# Patient Record
Sex: Female | Born: 1945 | Race: White | Hispanic: No | Smoking: Former smoker
Health system: Southern US, Community
[De-identification: ages and names within clinical notes are randomized; demographics above are authoritative.]

## PROBLEM LIST (undated history)

## (undated) DIAGNOSIS — N879 Dysplasia of cervix uteri, unspecified: Secondary | ICD-10-CM

## (undated) DIAGNOSIS — B029 Zoster without complications: Secondary | ICD-10-CM

## (undated) DIAGNOSIS — J189 Pneumonia, unspecified organism: Secondary | ICD-10-CM

## (undated) DIAGNOSIS — N952 Postmenopausal atrophic vaginitis: Secondary | ICD-10-CM

## (undated) DIAGNOSIS — I10 Essential (primary) hypertension: Secondary | ICD-10-CM

## (undated) DIAGNOSIS — J45909 Unspecified asthma, uncomplicated: Secondary | ICD-10-CM

## (undated) DIAGNOSIS — G709 Myoneural disorder, unspecified: Secondary | ICD-10-CM

## (undated) DIAGNOSIS — K219 Gastro-esophageal reflux disease without esophagitis: Secondary | ICD-10-CM

## (undated) DIAGNOSIS — M1990 Unspecified osteoarthritis, unspecified site: Secondary | ICD-10-CM

## (undated) DIAGNOSIS — M8580 Other specified disorders of bone density and structure, unspecified site: Secondary | ICD-10-CM

## (undated) DIAGNOSIS — N6009 Solitary cyst of unspecified breast: Secondary | ICD-10-CM

## (undated) DIAGNOSIS — C801 Malignant (primary) neoplasm, unspecified: Secondary | ICD-10-CM

## (undated) DIAGNOSIS — R6882 Decreased libido: Secondary | ICD-10-CM

## (undated) HISTORY — DX: Solitary cyst of unspecified breast: N60.09

## (undated) HISTORY — DX: Dysplasia of cervix uteri, unspecified: N87.9

## (undated) HISTORY — PX: HAND SURGERY: SHX662

## (undated) HISTORY — DX: Other specified disorders of bone density and structure, unspecified site: M85.80

## (undated) HISTORY — DX: Postmenopausal atrophic vaginitis: N95.2

## (undated) HISTORY — DX: Zoster without complications: B02.9

## (undated) HISTORY — PX: PELVIC LAPAROSCOPY: SHX162

## (undated) HISTORY — PX: BREAST CYST ASPIRATION: SHX578

## (undated) HISTORY — DX: Gastro-esophageal reflux disease without esophagitis: K21.9

## (undated) HISTORY — DX: Decreased libido: R68.82

## (undated) HISTORY — DX: Essential (primary) hypertension: I10

## (undated) HISTORY — DX: Malignant (primary) neoplasm, unspecified: C80.1

## (undated) HISTORY — DX: Unspecified asthma, uncomplicated: J45.909

---

## 1975-03-01 DIAGNOSIS — N879 Dysplasia of cervix uteri, unspecified: Secondary | ICD-10-CM

## 1975-03-01 HISTORY — PX: COLPOSCOPY: SHX161

## 1975-03-01 HISTORY — DX: Dysplasia of cervix uteri, unspecified: N87.9

## 1997-07-14 ENCOUNTER — Other Ambulatory Visit: Admission: RE | Admit: 1997-07-14 | Discharge: 1997-07-14 | Payer: Self-pay | Admitting: Obstetrics and Gynecology

## 1998-09-30 ENCOUNTER — Other Ambulatory Visit: Admission: RE | Admit: 1998-09-30 | Discharge: 1998-09-30 | Payer: Self-pay | Admitting: Obstetrics and Gynecology

## 1999-01-28 ENCOUNTER — Encounter (INDEPENDENT_AMBULATORY_CARE_PROVIDER_SITE_OTHER): Payer: Self-pay | Admitting: *Deleted

## 1999-01-28 ENCOUNTER — Ambulatory Visit (HOSPITAL_COMMUNITY): Admission: RE | Admit: 1999-01-28 | Discharge: 1999-01-28 | Payer: Self-pay | Admitting: Gastroenterology

## 1999-04-20 ENCOUNTER — Encounter: Admission: RE | Admit: 1999-04-20 | Discharge: 1999-04-20 | Payer: Self-pay | Admitting: Obstetrics and Gynecology

## 1999-04-20 ENCOUNTER — Encounter: Payer: Self-pay | Admitting: Obstetrics and Gynecology

## 1999-08-21 ENCOUNTER — Ambulatory Visit (HOSPITAL_COMMUNITY): Admission: EM | Admit: 1999-08-21 | Discharge: 1999-08-21 | Payer: Self-pay

## 1999-08-21 ENCOUNTER — Encounter: Payer: Self-pay | Admitting: Orthopedic Surgery

## 1999-10-13 ENCOUNTER — Other Ambulatory Visit: Admission: RE | Admit: 1999-10-13 | Discharge: 1999-10-13 | Payer: Self-pay | Admitting: Obstetrics and Gynecology

## 2000-05-04 ENCOUNTER — Encounter: Payer: Self-pay | Admitting: Obstetrics and Gynecology

## 2000-05-04 ENCOUNTER — Encounter: Admission: RE | Admit: 2000-05-04 | Discharge: 2000-05-04 | Payer: Self-pay | Admitting: Obstetrics and Gynecology

## 2000-10-13 ENCOUNTER — Other Ambulatory Visit: Admission: RE | Admit: 2000-10-13 | Discharge: 2000-10-13 | Payer: Self-pay | Admitting: Obstetrics and Gynecology

## 2000-11-01 ENCOUNTER — Encounter: Payer: Self-pay | Admitting: Obstetrics and Gynecology

## 2000-11-01 ENCOUNTER — Encounter: Admission: RE | Admit: 2000-11-01 | Discharge: 2000-11-01 | Payer: Self-pay | Admitting: Obstetrics and Gynecology

## 2001-05-08 ENCOUNTER — Encounter: Admission: RE | Admit: 2001-05-08 | Discharge: 2001-05-08 | Payer: Self-pay | Admitting: Obstetrics and Gynecology

## 2001-05-08 ENCOUNTER — Encounter: Payer: Self-pay | Admitting: Obstetrics and Gynecology

## 2001-10-17 ENCOUNTER — Other Ambulatory Visit: Admission: RE | Admit: 2001-10-17 | Discharge: 2001-10-17 | Payer: Self-pay | Admitting: Obstetrics and Gynecology

## 2002-02-04 ENCOUNTER — Ambulatory Visit (HOSPITAL_COMMUNITY): Admission: RE | Admit: 2002-02-04 | Discharge: 2002-02-04 | Payer: Self-pay | Admitting: Gastroenterology

## 2002-05-16 ENCOUNTER — Encounter: Payer: Self-pay | Admitting: Obstetrics and Gynecology

## 2002-05-16 ENCOUNTER — Encounter: Admission: RE | Admit: 2002-05-16 | Discharge: 2002-05-16 | Payer: Self-pay | Admitting: Obstetrics and Gynecology

## 2003-05-26 ENCOUNTER — Other Ambulatory Visit: Admission: RE | Admit: 2003-05-26 | Discharge: 2003-05-26 | Payer: Self-pay | Admitting: Obstetrics and Gynecology

## 2003-08-21 ENCOUNTER — Ambulatory Visit (HOSPITAL_COMMUNITY): Admission: RE | Admit: 2003-08-21 | Discharge: 2003-08-21 | Payer: Self-pay | Admitting: Obstetrics and Gynecology

## 2004-05-27 ENCOUNTER — Other Ambulatory Visit: Admission: RE | Admit: 2004-05-27 | Discharge: 2004-05-27 | Payer: Self-pay | Admitting: Obstetrics and Gynecology

## 2004-10-06 ENCOUNTER — Ambulatory Visit (HOSPITAL_COMMUNITY): Admission: RE | Admit: 2004-10-06 | Discharge: 2004-10-06 | Payer: Self-pay | Admitting: Obstetrics and Gynecology

## 2005-06-17 ENCOUNTER — Other Ambulatory Visit: Admission: RE | Admit: 2005-06-17 | Discharge: 2005-06-17 | Payer: Self-pay | Admitting: Obstetrics and Gynecology

## 2005-11-04 ENCOUNTER — Ambulatory Visit (HOSPITAL_COMMUNITY): Admission: RE | Admit: 2005-11-04 | Discharge: 2005-11-04 | Payer: Self-pay | Admitting: Obstetrics and Gynecology

## 2006-06-19 ENCOUNTER — Other Ambulatory Visit: Admission: RE | Admit: 2006-06-19 | Discharge: 2006-06-19 | Payer: Self-pay | Admitting: Obstetrics and Gynecology

## 2006-12-07 ENCOUNTER — Ambulatory Visit (HOSPITAL_COMMUNITY): Admission: RE | Admit: 2006-12-07 | Discharge: 2006-12-07 | Payer: Self-pay | Admitting: Obstetrics and Gynecology

## 2006-12-08 ENCOUNTER — Ambulatory Visit (HOSPITAL_COMMUNITY): Admission: RE | Admit: 2006-12-08 | Discharge: 2006-12-08 | Payer: Self-pay | Admitting: Obstetrics and Gynecology

## 2007-07-02 ENCOUNTER — Other Ambulatory Visit: Admission: RE | Admit: 2007-07-02 | Discharge: 2007-07-02 | Payer: Self-pay | Admitting: Obstetrics and Gynecology

## 2008-01-02 ENCOUNTER — Ambulatory Visit (HOSPITAL_COMMUNITY): Admission: RE | Admit: 2008-01-02 | Discharge: 2008-01-02 | Payer: Self-pay | Admitting: Pulmonary Disease

## 2008-07-02 ENCOUNTER — Other Ambulatory Visit: Admission: RE | Admit: 2008-07-02 | Discharge: 2008-07-02 | Payer: Self-pay | Admitting: Obstetrics and Gynecology

## 2008-07-02 ENCOUNTER — Ambulatory Visit: Payer: Self-pay | Admitting: Obstetrics and Gynecology

## 2008-07-02 ENCOUNTER — Encounter: Payer: Self-pay | Admitting: Obstetrics and Gynecology

## 2009-01-05 ENCOUNTER — Ambulatory Visit (HOSPITAL_COMMUNITY): Admission: RE | Admit: 2009-01-05 | Discharge: 2009-01-05 | Payer: Self-pay | Admitting: Obstetrics and Gynecology

## 2009-01-08 ENCOUNTER — Ambulatory Visit (HOSPITAL_COMMUNITY): Admission: RE | Admit: 2009-01-08 | Discharge: 2009-01-08 | Payer: Self-pay | Admitting: Obstetrics and Gynecology

## 2009-01-15 ENCOUNTER — Encounter: Admission: RE | Admit: 2009-01-15 | Discharge: 2009-01-15 | Payer: Self-pay | Admitting: Obstetrics and Gynecology

## 2009-07-03 ENCOUNTER — Ambulatory Visit: Payer: Self-pay | Admitting: Obstetrics and Gynecology

## 2009-07-03 ENCOUNTER — Other Ambulatory Visit: Admission: RE | Admit: 2009-07-03 | Discharge: 2009-07-03 | Payer: Self-pay | Admitting: Obstetrics and Gynecology

## 2010-01-26 ENCOUNTER — Encounter: Admission: RE | Admit: 2010-01-26 | Discharge: 2010-01-26 | Payer: Self-pay | Admitting: Obstetrics and Gynecology

## 2010-03-21 ENCOUNTER — Encounter: Payer: Self-pay | Admitting: Obstetrics and Gynecology

## 2010-07-07 ENCOUNTER — Encounter (INDEPENDENT_AMBULATORY_CARE_PROVIDER_SITE_OTHER): Payer: Medicare Other | Admitting: Obstetrics and Gynecology

## 2010-07-07 DIAGNOSIS — N951 Menopausal and female climacteric states: Secondary | ICD-10-CM

## 2010-07-07 DIAGNOSIS — M899 Disorder of bone, unspecified: Secondary | ICD-10-CM

## 2010-07-07 DIAGNOSIS — N952 Postmenopausal atrophic vaginitis: Secondary | ICD-10-CM

## 2010-07-16 NOTE — Op Note (Signed)
[facility]  Patient:    Erika Ramsey, Erika Ramsey                      MRN: [id number] Proc. Date: 08/21/99 Adm. Date:  [id number] Disc. Date: [id number] Attending:  Armanda Heritage CC:         Elisha Ponder, M.D.             Dr. Robert Bellow, Urgent Care                           Operative Report  PREOPERATIVE DIAGNOSES:  Right hand glass injury with deep imbedded, painful glass foreign body in the volar MCP region, right index finger.  Partial vs. complete radial nerve vascular bundle to the index finger laceration.  POSTOPERATIVE DIAGNOSES: 1. Imbedded, deep foreign object (glass) in the right index finger MCP region. 2. Partial radial digital nerve laceration to the right index finger. 3. Intact radial digital artery.  OPERATION: 1. Incision and drainage skin and subcutaneous tissue and deep tendinous    tissue, right palmar MCP region. 2. Removal of 1 cm glass foreign body about the right index finger palm region    at the MCP joint. 3. Inspection of the radial distal artery which did not reveal any laceration. 4. Repair radial digital nerve with epineurial technique.  This was a partial    laceration. 5. Microscope use for the radial digital nerve repair to the right index    finger.  SURGEON:  Elisha Ponder, M.D.  ASSISTANT:  None.  COMPLICATIONS:  None.  ANESTHESIA:  Bier block with IV sedation.  TOURNIQUET TIME:  Less than 45 minutes.  INDICATIONS:  This patient is a very pleasant 65 year old white female who presents with the above-mentioned diagnosis.  She was seen and stabilized at Urgent Care by Dr. Perrin Maltese.  I discussed her upper extremity predicament with him over the phone.  Following this she presented for my evaluation.  This did reveal the foreign body and expanded two-point discrimination in the radial digital nerve region to the index finger.  This injury was consistent with partial versus complete radial digital nerve laceration  as well as deeply imbedded and painful foreign body.  I cannot rule out radial digital artery laceration as well. I counseled her in regards to risks and benefits of surgery including the risks of infection, bleeding, anesthesia, damage to normal structures, failure of surgery to accomplish intended goals of relieving symptoms and restoring function.  I discussed her nature of nerve injury to the extremities.  I discussed with her that age and smoking habit has placed significant prognostic factors as well as the amount of nerve injury and I have discussed with her a 12-18 month total healing time after a nerve repair, etc.  With this in mind she desires to proceed.  OPERATIVE FINDINGS:  The radial digital artery was intact.  The radial digital nerve had a partial laceration.  This was repaired with epineurial technique under the microscope.  The foreign glass object penetrated the mid-substance of the radial digital nerve.  It was removed in its entirety.  The flexor apparatus was intact.  DESCRIPTION OF PROCEDURE:  The patient was seen by myself as well as Dr. Lucille Passy of anesthesia in preop holding and counseled in regards to risks and benefits of surgery and taken to the operative suite and underwent IV sedation and a Bier block anesthesia about the  right upper extremity.  She was then prepped and draped in the usual sterile fashion consisting of Betadine scrub followed by painting, followed by draping.  Once the sterile field was secured the patient had incision made proximal and distal to the entrance wound.  Very careful and gentle dissection was carried out until the glass foreign body was found.  This foreign body deeply penetrated the radial digital nerve.  This foreign body was removed and placed in a sterile cup. This was later given to the patient.  The tip of the glass was removed.  There was no obvious remnants left in the wound.  I took great care to inspect the  wound to make sure that the entire portion of the glass piece was out and corresponded to the x-ray preoperatively.  With this done the patient underwent copious irrigation and I&D of the wound.  The skin edges were debrided with a sharp knife blade where they had been frayed.  Following this the patient underwent inspection of the radial digital nerve with 4.0 magnification as well as radial digital artery.  The radial digital artery was noted to be intact without laceration.  The radial digital nerve had a mid-substance laceration through it.  The peripheral edges were intact. With this noted I then performed repair of the radial digital nerve under the microscope.  The microscope was brought into the operative field and using microscope at 200 and 250 magnification she underwent an epineurial repair with 10-0 nylon suture.  This repair was accomplished without difficulty.  The epineurial edges approximated well.  The area was copiously lavaged prior to repair.  Following repair the patient had further irrigation of the wound.  The flexors were noted to be intact as was the radial distal artery.  The tourniquet was then deflated, Marcaine 0.25% without epinephrine was placed in the wound for postoperative comfort and the patient then underwent closure of the wound with interrupted 4-0 Prolene.  The patient tolerated the procedure well, was transferred to the Recovery Room in stable condition.  All sponge, needle and instrument counts were reported as correct.  There were no complications.  She will return to see me in 7-9 days for suture removal and follow-up.  I placed her in a soft dressing with a volar splint for immobilization purposes.  She was given a prescription for Vicodin as well as Robaxin to be taken as directed and Keflex.  If there are any problems in the immediate postoperative period she will notify me.  It has been a pleasure participating in her care.  I look forward  to participating in her postoperative care and certainly appreciate the kind referral from Dr. Perrin Maltese.  DD:  08/21/99 TD:  08/23/99 Job: [id number]

## 2010-07-16 NOTE — Op Note (Signed)
   NAME:  Erika Ramsey, Erika Ramsey                         ACCOUNT NO.:  [id number]   MEDICAL RECORD NO.:  [id number]                   PATIENT TYPE:  AMB   LOCATION:  ENDO                                 FACILITY:  [facility]   PHYSICIAN:  James L. Malon Kindle., M.D.          DATE OF BIRTH:  01/26/1946   DATE OF PROCEDURE:  DATE OF DISCHARGE:                                 OPERATIVE REPORT   PROCEDURE:  Colonoscopy.   MEDICATIONS:  Fentanyl 100 mcg, Versed 7.5 mg IV.   SCOPE:  Olympus pediatric colonoscope.   INDICATIONS:  Follow up of previous colon polyps.  Also, a strong family  history of colon cancer.   DESCRIPTION OF PROCEDURE:  The procedure has been explained to the patient,  consent obtained.  Placed in the left lateral decubitus position, the  Olympus pediatric scope was inserted and advanced under direct  visualization.  The prep was excellent, were able to reach the cecum without  difficulty.  The ileocecal valve and appendiceal orifice were seen.  The  scope was withdrawn.  The colon was carefully examined.  The cecum,  ascending colon, transverse colon and descending sigmoid colon were seen  well.  No polyps or other lesions wee seen.  There were internal hemorrhoids  seen in the rectum upon removal of the scope.  The scope was withdrawn, the  patient tolerated the procedure well.   ASSESSMENT:  1. Internal hemorrhoids.  2. No evidence of further polyps.   PLAN:  Due to her previous history of personal polyps and family history of  colon cancer, would recommend repeating her colonoscopy.  This will be  scheduled in five years.  Will also recommend yearly hemoccults.                                               James L. Malon Kindle., M.D.    Waldron Session  D:  02/04/2002  T:  02/04/2002  Job:  [id number]   cc:   Reuel Boom L. Eda Paschal, M.D.  [address], [address]  [place]  [place] [address]  Fax: [phone]

## 2010-07-16 NOTE — Procedures (Signed)
[place]  Patient:    Erika Ramsey                       MRN: [id number] Proc. Date: 01/28/99 Adm. Date:  [id number] Attending:  Orland Mustard CC:         Rande Brunt. Eda Paschal, M.D.                           Procedure Report  PROCEDURE:  Colonoscopy and polypectomy  MEDICATIONS:  Fentanyl 70 mcg, Versed 7.5 mg IV.  SCOPE:  Pediatric video colonoscope.  INDICATIONS:  Strong family history of colon cancer and her father had colon cancer.  DESCRIPTION OF PROCEDURE:  Procedure has been explained to patient, consent obtained.  Patient in left lateral decubitus position.  Olympus video colonoscope inserted. The prep was excellent. We were able to advance easily to the cecum.  There was no significant diverticular disease or narrowing.  Cecum identified by transillumination of the right lower quadrant and identification of the ileocecal valve and appendiceal orifice.  Scope withdrawn.  Cecum, ascending colon, hepatic flexure, transverse colon, splenic flexure, descending and sigmoid colon seen well. In proximal descending colon, a 3.25 cm polyp was encountered, removed with the  snare and sucked through the scope.  No other polyp seen in the sigmoid colon.  Small internal hemorrhoids seen in rectum upon removal of the scope.  Scope withdrawn.  Patient tolerated the procedure well, was maintained on low-flow oxygen and pulse oximeter throughout the procedure with no obvious problem.  ASSESSMENT: 1. Sigmoid colon polyp removed. 2. Internal hemorrhoids.  PLAN:  Will recommend repeating in three years and go ahead and use an adult scope at that time. DD:  01/28/99 TD:  01/28/99 Job: [id number]

## 2010-08-10 ENCOUNTER — Ambulatory Visit (HOSPITAL_COMMUNITY)
Admission: RE | Admit: 2010-08-10 | Discharge: 2010-08-10 | Disposition: A | Discharge status: Home / Self Care | Payer: Medicare Other | Source: Ambulatory Visit | Attending: Family Medicine | Admitting: Family Medicine

## 2010-08-10 DIAGNOSIS — L988 Other specified disorders of the skin and subcutaneous tissue: Secondary | ICD-10-CM | POA: Insufficient documentation

## 2010-08-10 DIAGNOSIS — M7989 Other specified soft tissue disorders: Secondary | ICD-10-CM | POA: Insufficient documentation

## 2010-12-03 ENCOUNTER — Other Ambulatory Visit: Payer: Self-pay | Admitting: *Deleted

## 2010-12-03 MED ORDER — ALPRAZOLAM 0.25 MG PO TABS: 0.2500 mg | ORAL_TABLET | Freq: Every evening | ORAL | Status: DC | PRN

## 2010-12-21 ENCOUNTER — Encounter: Payer: Self-pay | Admitting: Obstetrics and Gynecology

## 2010-12-21 ENCOUNTER — Encounter: Payer: Self-pay | Admitting: Gynecology

## 2010-12-21 ENCOUNTER — Ambulatory Visit (INDEPENDENT_AMBULATORY_CARE_PROVIDER_SITE_OTHER): Payer: Medicare Other | Admitting: Obstetrics and Gynecology

## 2010-12-21 DIAGNOSIS — R6882 Decreased libido: Secondary | ICD-10-CM | POA: Insufficient documentation

## 2010-12-21 DIAGNOSIS — N952 Postmenopausal atrophic vaginitis: Secondary | ICD-10-CM | POA: Insufficient documentation

## 2010-12-21 DIAGNOSIS — Z23 Encounter for immunization: Secondary | ICD-10-CM

## 2010-12-21 DIAGNOSIS — M8580 Other specified disorders of bone density and structure, unspecified site: Secondary | ICD-10-CM | POA: Insufficient documentation

## 2010-12-21 DIAGNOSIS — I10 Essential (primary) hypertension: Secondary | ICD-10-CM | POA: Insufficient documentation

## 2010-12-21 NOTE — Progress Notes (Signed)
Addended by: Dayna Barker on: 12/21/2010 12:31 PM   Modules accepted: Orders

## 2010-12-21 NOTE — Progress Notes (Signed)
Patient came to see me today because of an issue with her vaginal ring. We had switched her from Vagifem to Estring and she says it is working much better. She gets more even lubrication. She had no trouble inserting it. She however could not remove it. 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 She has had no vaginal bleeding.  Exam: External within normal limits. BUS within normal limits. Vaginal exam shows better estrogen effect. Estring was in place and was removed with ease.  Assessment: #1. Atrophic vaginitis #2.Stress relieved with xanax.  Plan: Estring removed. Patient will place another one herself. Patient wanted to be sure she could take 5-6 per month. I reassured her that  was fine.

## 2011-02-08 ENCOUNTER — Other Ambulatory Visit: Payer: Self-pay | Admitting: Obstetrics and Gynecology

## 2011-02-08 DIAGNOSIS — Z1231 Encounter for screening mammogram for malignant neoplasm of breast: Secondary | ICD-10-CM

## 2011-02-17 ENCOUNTER — Ambulatory Visit
Admission: RE | Admit: 2011-02-17 | Discharge: 2011-02-17 | Disposition: A | Discharge status: Home / Self Care | Payer: Medicare Other | Source: Ambulatory Visit | Attending: Obstetrics and Gynecology | Admitting: Obstetrics and Gynecology

## 2011-02-17 DIAGNOSIS — Z1231 Encounter for screening mammogram for malignant neoplasm of breast: Secondary | ICD-10-CM

## 2011-04-25 ENCOUNTER — Encounter: Payer: Self-pay | Admitting: Gynecology

## 2011-04-25 ENCOUNTER — Ambulatory Visit (INDEPENDENT_AMBULATORY_CARE_PROVIDER_SITE_OTHER): Payer: Medicare Other | Admitting: Gynecology

## 2011-04-25 DIAGNOSIS — N952 Postmenopausal atrophic vaginitis: Secondary | ICD-10-CM

## 2011-04-25 NOTE — Progress Notes (Signed)
Patient presents to have her Estring changed. She is using it for atrophic vaginitis been doing great with this. She normally sees Dr. Eda Paschal.  Exam was Sherrilyn Rist chaperone present Pelvic external BUS vagina normal. Estring was removed. Bimanual without masses or tenderness. A new Estring was replaced.  Assessment and plan: Atrophic vaginitis using Estring doing well. Estring was placed old one was removed and discarded. Patient will follow up in 3 months when she is due for her annual and replacement of her Estring.

## 2011-04-25 NOTE — Patient Instructions (Signed)
Follow up in May when you're due for your annual exam 

## 2011-07-08 ENCOUNTER — Encounter: Payer: Medicare Other | Admitting: Obstetrics and Gynecology

## 2011-07-22 ENCOUNTER — Other Ambulatory Visit: Payer: Self-pay | Admitting: *Deleted

## 2011-07-22 MED ORDER — ESTRADIOL 2 MG VA RING: 2.0000 mg | VAGINAL_RING | VAGINAL | Status: DC

## 2011-07-27 ENCOUNTER — Encounter: Payer: Self-pay | Admitting: Obstetrics and Gynecology

## 2011-07-27 ENCOUNTER — Other Ambulatory Visit (HOSPITAL_COMMUNITY)
Admission: RE | Admit: 2011-07-27 | Discharge: 2011-07-27 | Disposition: A | Discharge status: Home / Self Care | Payer: Medicare Other | Source: Ambulatory Visit | Attending: Obstetrics and Gynecology | Admitting: Obstetrics and Gynecology

## 2011-07-27 ENCOUNTER — Ambulatory Visit (INDEPENDENT_AMBULATORY_CARE_PROVIDER_SITE_OTHER): Payer: Medicare Other | Admitting: Obstetrics and Gynecology

## 2011-07-27 VITALS — BP 120/74 | Ht 61.5 in | Wt 131.0 lb

## 2011-07-27 DIAGNOSIS — D069 Carcinoma in situ of cervix, unspecified: Secondary | ICD-10-CM

## 2011-07-27 DIAGNOSIS — IMO0002 Reserved for concepts with insufficient information to code with codable children: Secondary | ICD-10-CM

## 2011-07-27 DIAGNOSIS — M8580 Other specified disorders of bone density and structure, unspecified site: Secondary | ICD-10-CM

## 2011-07-27 DIAGNOSIS — N6009 Solitary cyst of unspecified breast: Secondary | ICD-10-CM

## 2011-07-27 DIAGNOSIS — Z124 Encounter for screening for malignant neoplasm of cervix: Secondary | ICD-10-CM

## 2011-07-27 DIAGNOSIS — M949 Disorder of cartilage, unspecified: Secondary | ICD-10-CM

## 2011-07-27 DIAGNOSIS — N952 Postmenopausal atrophic vaginitis: Secondary | ICD-10-CM

## 2011-07-27 DIAGNOSIS — N879 Dysplasia of cervix uteri, unspecified: Secondary | ICD-10-CM | POA: Insufficient documentation

## 2011-07-27 MED ORDER — ESTRADIOL 2 MG VA RING: 2.0000 mg | VAGINAL_RING | VAGINAL | Status: DC

## 2011-07-27 NOTE — Progress Notes (Signed)
Patient came back to see me today for further followup. We have been using an Estring for atrophic vaginitis with dyspareunia. It has worked well. The only issue has been that she has trouble removing that and we have to see her in the office to change it. She is having no vaginal bleeding. She is having no pelvic pain. Her last bone density was in 2010 and showed stability. She does have osteopenia but does not have an elevated FRAX risk. She takes calcium and vitamin D. She has not had any fractures. Her only other menopausal symptoms is diminished libido which seems to be somewhat better with the vaginal ring. 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 She has been having normal Pap smears. She is having no dysuria, urgency or incontinence of urination.  ROS: 12 system review done. Pertinent positives above. Other positive is hypertension. She also has a history of a breast cyst that her last mammogram was normal.  Physical examination: Erika Ramsey present. HEENT within normal limits. Neck: Thyroid not large. No masses. Supraclavicular nodes: not enlarged. Breasts: Examined in both sitting and lying  position. No skin changes and no masses. Abdomen: Soft no guarding rebound or masses or hernia. Pelvic: External: Within normal limits. BUS: Within normal limits. Vaginal:within normal limits. Good estrogen effect. No evidence of cystocele rectocele or enterocele. Cervix: clean. Uterus: Normal size and shape. Adnexa: No masses. Rectovaginal exam: Confirmatory and negative. Extremities: Within normal limits.  Assessment: #1. Atrophic vaginitis #2. CIN-3 #3. Low bone mass #4. Menopausal symptoms #5. Breasts cyst  Plan:Old estring removed and newone placed. Continue yearly mammograms. Bone density with next mammogram.

## 2011-07-27 NOTE — Patient Instructions (Signed)
Schedule  bone density at time of next mammogram.

## 2011-08-25 ENCOUNTER — Other Ambulatory Visit: Payer: Self-pay | Admitting: Family Medicine

## 2011-08-25 NOTE — Telephone Encounter (Signed)
Needs office visit.

## 2011-10-20 ENCOUNTER — Ambulatory Visit (INDEPENDENT_AMBULATORY_CARE_PROVIDER_SITE_OTHER): Payer: Medicare Other | Admitting: Obstetrics and Gynecology

## 2011-10-20 DIAGNOSIS — N952 Postmenopausal atrophic vaginitis: Secondary | ICD-10-CM

## 2011-10-20 NOTE — Progress Notes (Signed)
Patient came to see me today to have her Estring changed. She can not remove it herself. She is very happy with it in terms of relief  of her atrophic vaginitis.  Exam: Erika Ramsey present. External: Within normal limits. Vaginal exam: Estring in place. Excellent estrogen effect present.  Assessment: Atrophic vaginitis.  Plan: Old Estring removed. A new Estring placed with ease. She will continue with a new ring every 90 days.

## 2012-01-25 ENCOUNTER — Encounter: Payer: Self-pay | Admitting: Obstetrics and Gynecology

## 2012-01-25 ENCOUNTER — Ambulatory Visit (INDEPENDENT_AMBULATORY_CARE_PROVIDER_SITE_OTHER): Payer: Medicare Other | Admitting: Obstetrics and Gynecology

## 2012-01-25 DIAGNOSIS — N952 Postmenopausal atrophic vaginitis: Secondary | ICD-10-CM

## 2012-01-25 MED ORDER — ALPRAZOLAM 0.25 MG PO TABS: 0.2500 mg | ORAL_TABLET | Freq: Every evening | ORAL | Status: AC | PRN

## 2012-01-25 NOTE — Patient Instructions (Signed)
Return if needed for ring removal.

## 2012-01-25 NOTE — Progress Notes (Signed)
Patient came to see me today to have me  remove her Estring. She likes it better than the Vagifem which she has used. She can insert it properly. She cannot however remove it. She also needs a refill on her Xanax which she uses for anxiety with excellent results.  Exam: Erika Ramsey present. Estring was properly placed. It was removed with ease. Patient did not have a new one with her. She will get 1 and insert it  Herself. Xanax with refills written.

## 2012-02-03 ENCOUNTER — Other Ambulatory Visit: Payer: Self-pay | Admitting: Obstetrics and Gynecology

## 2012-02-29 DIAGNOSIS — M8580 Other specified disorders of bone density and structure, unspecified site: Secondary | ICD-10-CM

## 2012-02-29 HISTORY — DX: Other specified disorders of bone density and structure, unspecified site: M85.80

## 2012-03-01 ENCOUNTER — Telehealth: Payer: Self-pay | Admitting: *Deleted

## 2012-03-01 DIAGNOSIS — M8580 Other specified disorders of bone density and structure, unspecified site: Secondary | ICD-10-CM

## 2012-03-01 NOTE — Telephone Encounter (Signed)
Pt called requesting order placed for bone density at [facility] hospital, order placed.

## 2012-03-05 ENCOUNTER — Other Ambulatory Visit: Payer: Self-pay | Admitting: Gynecology

## 2012-03-05 DIAGNOSIS — Z1231 Encounter for screening mammogram for malignant neoplasm of breast: Secondary | ICD-10-CM

## 2012-03-13 ENCOUNTER — Ambulatory Visit (HOSPITAL_COMMUNITY)
Admission: RE | Admit: 2012-03-13 | Discharge: 2012-03-13 | Disposition: A | Discharge status: Home / Self Care | Payer: Medicare Other | Source: Ambulatory Visit | Attending: Gynecology | Admitting: Gynecology

## 2012-03-13 DIAGNOSIS — Z780 Asymptomatic menopausal state: Secondary | ICD-10-CM | POA: Insufficient documentation

## 2012-03-13 DIAGNOSIS — Z13820 Encounter for screening for osteoporosis: Secondary | ICD-10-CM | POA: Insufficient documentation

## 2012-03-13 DIAGNOSIS — M8580 Other specified disorders of bone density and structure, unspecified site: Secondary | ICD-10-CM

## 2012-03-13 DIAGNOSIS — M949 Disorder of cartilage, unspecified: Secondary | ICD-10-CM | POA: Insufficient documentation

## 2012-03-13 DIAGNOSIS — Z1231 Encounter for screening mammogram for malignant neoplasm of breast: Secondary | ICD-10-CM

## 2012-03-13 DIAGNOSIS — M899 Disorder of bone, unspecified: Secondary | ICD-10-CM | POA: Insufficient documentation

## 2012-03-20 ENCOUNTER — Other Ambulatory Visit: Payer: Self-pay | Admitting: *Deleted

## 2012-03-20 MED ORDER — LISINOPRIL-HYDROCHLOROTHIAZIDE 10-12.5 MG PO TABS: 1.0000 | ORAL_TABLET | Freq: Every day | ORAL | Status: DC

## 2012-03-23 ENCOUNTER — Encounter: Payer: Self-pay | Admitting: Gynecology

## 2012-04-13 ENCOUNTER — Encounter: Payer: Self-pay | Admitting: Family Medicine

## 2012-05-14 ENCOUNTER — Other Ambulatory Visit: Payer: Self-pay | Admitting: Physician Assistant

## 2012-05-14 NOTE — Telephone Encounter (Signed)
Please pull paper chart.  

## 2012-05-14 NOTE — Telephone Encounter (Signed)
Pt needs OV, last seen June 2012

## 2012-05-14 NOTE — Telephone Encounter (Signed)
Chart pulled at pa pool [id number]

## 2012-05-16 ENCOUNTER — Other Ambulatory Visit: Payer: Self-pay | Admitting: Physician Assistant

## 2012-05-17 ENCOUNTER — Other Ambulatory Visit: Payer: Self-pay | Admitting: Physician Assistant

## 2012-07-16 ENCOUNTER — Ambulatory Visit (INDEPENDENT_AMBULATORY_CARE_PROVIDER_SITE_OTHER): Payer: Medicare Other | Admitting: Gynecology

## 2012-07-16 ENCOUNTER — Encounter: Payer: Self-pay | Admitting: Gynecology

## 2012-07-16 VITALS — BP 152/80

## 2012-07-16 DIAGNOSIS — N952 Postmenopausal atrophic vaginitis: Secondary | ICD-10-CM

## 2012-07-16 DIAGNOSIS — M8580 Other specified disorders of bone density and structure, unspecified site: Secondary | ICD-10-CM

## 2012-07-16 DIAGNOSIS — M899 Disorder of bone, unspecified: Secondary | ICD-10-CM

## 2012-07-16 MED ORDER — MEDROXYPROGESTERONE ACETATE 10 MG PO TABS: ORAL_TABLET | ORAL | Status: DC

## 2012-07-16 NOTE — Progress Notes (Signed)
Patient presented to the office today to have her Estring removed and insert a new one. She has difficulty retrieving it but has no problem inserting it. Patient does this every 3 months. On review of patient's records it appears that she is on unopposed estrogen although she has had no vaginal bleeding.  Exam: Bartholin urethra Skene was within normal limits Vagina no gross lesions on inspection Estring removed Cervix: No lesions or discharge  Assessment/plan: Patient on Estring Q3 months. I discussed with her the concerns of unopposed estrogen and that I would recommend that every 3 months when she comes in to remove the Estring. Then shortly thereafter she can reinserted herself. I discussed over the concerns of unopposed estrogen and endometrial hyperplasia or endometrial cancer.she wanted me to look at her bone density study done at another facility in January it appears she had osteopenia but would normal Frax analysis

## 2012-07-26 ENCOUNTER — Encounter: Payer: Self-pay | Admitting: Gynecology

## 2012-08-06 ENCOUNTER — Encounter: Payer: Self-pay | Admitting: Gynecology

## 2012-08-07 ENCOUNTER — Encounter: Payer: Self-pay | Admitting: Gynecology

## 2012-08-07 ENCOUNTER — Ambulatory Visit (INDEPENDENT_AMBULATORY_CARE_PROVIDER_SITE_OTHER): Payer: Medicare Other | Admitting: Gynecology

## 2012-08-07 ENCOUNTER — Other Ambulatory Visit (HOSPITAL_COMMUNITY)
Admission: RE | Admit: 2012-08-07 | Discharge: 2012-08-07 | Disposition: A | Discharge status: Home / Self Care | Payer: Medicare Other | Source: Ambulatory Visit | Attending: Gynecology | Admitting: Gynecology

## 2012-08-07 VITALS — BP 134/86 | Ht 61.0 in | Wt 129.0 lb

## 2012-08-07 DIAGNOSIS — M8580 Other specified disorders of bone density and structure, unspecified site: Secondary | ICD-10-CM

## 2012-08-07 DIAGNOSIS — Z9189 Other specified personal risk factors, not elsewhere classified: Secondary | ICD-10-CM

## 2012-08-07 DIAGNOSIS — Z87410 Personal history of cervical dysplasia: Secondary | ICD-10-CM

## 2012-08-07 DIAGNOSIS — Z01419 Encounter for gynecological examination (general) (routine) without abnormal findings: Secondary | ICD-10-CM | POA: Insufficient documentation

## 2012-08-07 DIAGNOSIS — N952 Postmenopausal atrophic vaginitis: Secondary | ICD-10-CM

## 2012-08-07 DIAGNOSIS — M949 Disorder of cartilage, unspecified: Secondary | ICD-10-CM

## 2012-08-07 DIAGNOSIS — Z1151 Encounter for screening for human papillomavirus (HPV): Secondary | ICD-10-CM | POA: Insufficient documentation

## 2012-08-07 DIAGNOSIS — Z124 Encounter for screening for malignant neoplasm of cervix: Secondary | ICD-10-CM

## 2012-08-07 NOTE — Addendum Note (Signed)
Addended by: Bertram Savin A on: 08/07/2012 03:35 PM   Modules accepted: Orders

## 2012-08-07 NOTE — Patient Instructions (Signed)

## 2012-08-07 NOTE — Progress Notes (Signed)
Erika Ramsey 1946/01/27 [id number]   History:    67 y.o.  for 4 GYN exam and followup . Patient is currently on Estring 2 mg vaginally to 3 months. When I saw her in the office on May 19 to remove the outdated Estring we had discussed concerns I had her taking unopposed estrogen and had recommended that when she comes to the office every 3 months to have it removed before inserting a new one that she would take the Provera 10 mg daily for 10 days. She did that last month and had no vaginal bleeding. This has worked well for her vaginal atrophy. Review of her records indicated that in 1997 she had CIN-3 and was treated with cryotherapy and her followup Pap smears have been normal. Patient had a negative colonoscopy in 2012. Both her father and grandfather had colon cancer. Her last bone density study was in January 2014 and was stable when compared with previous study of 2010. Her lowest T. Score was at the left femoral neck with a value of -1.6 in the osteopenic arranged (decreased bone mineralization). Patient had normal Frax scores. Patient had a normal mammogram this year and she frequent does her breast exam. Her primary physician is Dr. Nadyne Coombes who is doing her lab work. Patient does suffer from insomnia and anxiety and takes Xanax when necessary. Patient states her shingles vaccine is up-to-date. She believes that her Tdap vaccine is up-to-date   Past medical history,surgical history, family history and social history were all reviewed and documented in the EPIC chart.  Gynecologic History No LMP recorded. Patient is postmenopausal. Contraception: post menopausal status Last Pap: 2013. Results were: normal Last mammogram: 2014. Results were: normal  Obstetric History OB History   Grav Para Term Preterm Abortions TAB SAB Ect Mult Living   0                ROS: A ROS was performed and pertinent positives and negatives are included in the history.  GENERAL: No fevers or chills. HEENT:  No change in vision, no earache, sore throat or sinus congestion. NECK: No pain or stiffness. CARDIOVASCULAR: No chest pain or pressure. No palpitations. PULMONARY: No shortness of breath, cough or wheeze. GASTROINTESTINAL: No abdominal pain, nausea, vomiting or diarrhea, melena or bright red blood per rectum. GENITOURINARY: No urinary frequency, urgency, hesitancy or dysuria. MUSCULOSKELETAL: No joint or muscle pain, no back pain, no recent trauma. DERMATOLOGIC: No rash, no itching, no lesions. ENDOCRINE: No polyuria, polydipsia, no heat or cold intolerance. No recent change in weight. HEMATOLOGICAL: No anemia or easy bruising or bleeding. NEUROLOGIC: No headache, seizures, numbness, tingling or weakness. PSYCHIATRIC: No depression, no loss of interest in normal activity or change in sleep pattern.     Exam: chaperone present  BP 134/86  Ht 5\' 1"  (1.549 m)  Wt 129 lb (58.514 kg)  BMI 24.39 kg/m2  Body mass index is 24.39 kg/(m^2).  General appearance : Well developed well nourished female. No acute distress HEENT: Neck supple, trachea midline, no carotid bruits, no thyroidmegaly Lungs: Clear to auscultation, no rhonchi or wheezes, or rib retractions  Heart: Regular rate and rhythm, no murmurs or gallops Breast:Examined in sitting and supine position were symmetrical in appearance, no palpable masses or tenderness,  no skin retraction, no nipple inversion, no nipple discharge, no skin discoloration, no axillary or supraclavicular lymphadenopathy Abdomen: no palpable masses or tenderness, no rebound or guarding Extremities: no edema or skin discoloration or tenderness  Pelvic:  Bartholin, Urethra, Skene Glands: Within normal limits             Vagina: No gross lesions or discharge  Cervix: No gross lesions or discharge  Uterus  anteverted, normal size, shape and consistency, non-tender and mobile  Adnexa  Without masses or tenderness  Anus and perineum  normal   Rectovaginal  normal  sphincter tone without palpated masses or tenderness             Hemoccult card provided     Assessment/Plan:  67 y.o. female doing well with 2 mg Estring which she applies every 3 months. She is taking Provera 10 mg for 10 days when she comes in to change her Estring. She was reminded to do her monthly self breast examination. We discussed the importance of calcium and vitamin D and regular exercise for osteoporosis prevention. Due to the fact the patient had several years ago CIN-3 treated with cryotherapy I'm going to continue to do her Pap smears every year for close surveillance. Her primary physician will be doing her lab work. She was given Hemoccult cards cemented to the office for testing.    Ok Edwards MD, 2:59 PM 08/07/2012

## 2012-09-05 ENCOUNTER — Other Ambulatory Visit: Payer: Self-pay | Admitting: Anesthesiology

## 2012-09-05 DIAGNOSIS — Z1211 Encounter for screening for malignant neoplasm of colon: Secondary | ICD-10-CM

## 2012-10-25 ENCOUNTER — Encounter: Payer: Self-pay | Admitting: Gynecology

## 2012-10-25 ENCOUNTER — Ambulatory Visit (INDEPENDENT_AMBULATORY_CARE_PROVIDER_SITE_OTHER): Payer: Medicare Other | Admitting: Gynecology

## 2012-10-25 VITALS — BP 140/80

## 2012-10-25 DIAGNOSIS — Z780 Asymptomatic menopausal state: Secondary | ICD-10-CM

## 2012-10-25 DIAGNOSIS — Z79890 Hormone replacement therapy (postmenopausal): Secondary | ICD-10-CM

## 2012-10-25 DIAGNOSIS — N951 Menopausal and female climacteric states: Secondary | ICD-10-CM

## 2012-10-25 NOTE — Progress Notes (Signed)
Patient presented to the office today for assistance of removing her Estring 2 mg which she has trouble removing every 3 months and comes to the office to have this removed. It was recommended that patient take Provera 10 mg daily for 10 days after the removal of the Estring every 3 months and this is what she is here for today. See previous note dated 08/07/2012 for detail.  Pelvic exam: Bartholin urethra Skene glands within normal limits Vagina: Vagina well estrogenized Estring were removed no lesions or erosion or irritation or discharge seen  Assessment/plan: Postmenopausal patient on Estring 2 mg every 3 months for vaginal atrophy has done well. It was removed today. Patient will take her Provera 10 mg for 10 days and then reinserting the new Estring with her she menstruates or not.

## 2012-11-02 ENCOUNTER — Other Ambulatory Visit: Payer: Self-pay | Admitting: Gynecology

## 2013-03-05 ENCOUNTER — Ambulatory Visit (INDEPENDENT_AMBULATORY_CARE_PROVIDER_SITE_OTHER): Payer: Medicare Other | Admitting: Gynecology

## 2013-03-05 ENCOUNTER — Encounter: Payer: Self-pay | Admitting: Gynecology

## 2013-03-05 VITALS — BP 138/86

## 2013-03-05 DIAGNOSIS — N951 Menopausal and female climacteric states: Secondary | ICD-10-CM

## 2013-03-05 MED ORDER — ALPRAZOLAM 0.25 MG PO TABS: ORAL_TABLET | ORAL | Status: DC

## 2013-03-05 MED ORDER — MEDROXYPROGESTERONE ACETATE 10 MG PO TABS: ORAL_TABLET | ORAL | Status: DC

## 2013-03-05 NOTE — Progress Notes (Signed)
   Patient presented to the office today for assistance of removing her Estring 2 mg which she has trouble removing every 3 months and comes to the office to have this removed. It was recommended that patient take Provera 10 mg daily for 10 days after the removal of the Estring every 3 months and this is what she is here for today.  Patient forgot to come to the office last month to have the Estring removed.  Pelvic exam:  Bartholin urethra Skene glands within normal limits  Vagina: Vagina well estrogenized Estring were removed no lesions or erosion or irritation or discharge seen.  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  Patient was reminded to take her Provera now 10 mg for the next 10 days and then insert Estring. She will return back in 3 months to repeat the above sequence. She is schedule in June for her annual exam. Her blood pressure today was 130/86.

## 2013-03-28 ENCOUNTER — Other Ambulatory Visit: Payer: Self-pay | Admitting: Gynecology

## 2013-03-28 DIAGNOSIS — Z1231 Encounter for screening mammogram for malignant neoplasm of breast: Secondary | ICD-10-CM

## 2013-04-01 ENCOUNTER — Ambulatory Visit (HOSPITAL_COMMUNITY)
Admission: RE | Admit: 2013-04-01 | Discharge: 2013-04-01 | Disposition: A | Discharge status: Home / Self Care | Payer: Medicare Other | Source: Ambulatory Visit | Attending: Gynecology | Admitting: Gynecology

## 2013-04-01 DIAGNOSIS — Z1231 Encounter for screening mammogram for malignant neoplasm of breast: Secondary | ICD-10-CM | POA: Insufficient documentation

## 2013-05-20 ENCOUNTER — Ambulatory Visit (INDEPENDENT_AMBULATORY_CARE_PROVIDER_SITE_OTHER): Payer: Medicare Other | Admitting: Gynecology

## 2013-05-20 ENCOUNTER — Encounter: Payer: Self-pay | Admitting: Gynecology

## 2013-05-20 VITALS — BP 130/76

## 2013-05-20 DIAGNOSIS — Z79890 Hormone replacement therapy (postmenopausal): Secondary | ICD-10-CM

## 2013-05-20 DIAGNOSIS — N952 Postmenopausal atrophic vaginitis: Secondary | ICD-10-CM

## 2013-05-20 NOTE — Patient Instructions (Signed)
Remember you are overdue for your mammogram.

## 2013-05-20 NOTE — Progress Notes (Signed)
   Patient presented to the office today for assistance of removing her Estring 2 mg which she has trouble removing every 3 months and comes to the office to have this removed. It was recommended that patient take Provera 10 mg daily for 10 days after the removal of the Estring every 3 months and this is what she is here for today.   Pelvic exam:  Bartholin urethra Skene glands within normal limits  Vagina: Vagina well estrogenized Estring were removed no lesions or erosion or irritation or discharge seen.  Patient was reminded to take her Provera now 10 mg for the next 10 days and then insert Estring. She will return back in 3 months as well since she is due for her annual exam.

## 2013-05-22 ENCOUNTER — Encounter: Payer: Self-pay | Admitting: Gynecology

## 2013-08-19 ENCOUNTER — Encounter: Payer: Medicare Other | Admitting: Gynecology

## 2013-09-10 ENCOUNTER — Encounter: Payer: Self-pay | Admitting: Gynecology

## 2013-09-10 ENCOUNTER — Ambulatory Visit (INDEPENDENT_AMBULATORY_CARE_PROVIDER_SITE_OTHER): Payer: Medicare Other | Admitting: Gynecology

## 2013-09-10 ENCOUNTER — Other Ambulatory Visit (HOSPITAL_COMMUNITY)
Admission: RE | Admit: 2013-09-10 | Discharge: 2013-09-10 | Disposition: A | Discharge status: Home / Self Care | Payer: Medicare Other | Source: Ambulatory Visit | Attending: Gynecology | Admitting: Gynecology

## 2013-09-10 VITALS — BP 142/86 | Ht 61.5 in | Wt 135.0 lb

## 2013-09-10 DIAGNOSIS — N952 Postmenopausal atrophic vaginitis: Secondary | ICD-10-CM

## 2013-09-10 DIAGNOSIS — M899 Disorder of bone, unspecified: Secondary | ICD-10-CM

## 2013-09-10 DIAGNOSIS — Z124 Encounter for screening for malignant neoplasm of cervix: Secondary | ICD-10-CM | POA: Diagnosis present

## 2013-09-10 DIAGNOSIS — M949 Disorder of cartilage, unspecified: Secondary | ICD-10-CM

## 2013-09-10 DIAGNOSIS — Z79890 Hormone replacement therapy (postmenopausal): Secondary | ICD-10-CM

## 2013-09-10 DIAGNOSIS — M8580 Other specified disorders of bone density and structure, unspecified site: Secondary | ICD-10-CM

## 2013-09-10 DIAGNOSIS — Z87410 Personal history of cervical dysplasia: Secondary | ICD-10-CM

## 2013-09-10 MED ORDER — MEDROXYPROGESTERONE ACETATE 10 MG PO TABS: ORAL_TABLET | ORAL | Status: DC

## 2013-09-10 MED ORDER — ESTRADIOL 2 MG VA RING
2.0000 mg | VAGINAL_RING | VAGINAL | Status: DC
Start: 2013-09-10 — End: 2013-12-26

## 2013-09-10 NOTE — Progress Notes (Signed)
Erika Ramsey 1946-01-31 [id number]   History:    68 y.o.  For GYN followup. Patient is currently on Estring 2 mg vaginally to 3 months. Last year she was prescribed Provera 10 mg to take 1 by mouth daily for 10 days out of the month because of the concerns I had of her taking unopposed estrogen. She reports no vaginal bleeding. She does complain and cons of decreased libido. This has worked well for vaginal atrophy as well. My partner who retired and Dr. 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 Both her father and grandfather had colon cancer. Her last bone density study was in January 2014 and was stable when compared with previous study of 2010. Her lowest T. Score was at the left femoral neck with a value of -1.6 in the osteopenic arranged (decreased bone mineralization). Patient had normal Frax scores.Her primary physician is Dr. Horald Pollen who is doing her lab work. Patient does suffer from insomnia and anxiety and takes Xanax when necessary. Patient states her shingles vaccine is up-to-date. She believes that her Tdap vaccine is up-to-date     Past medical history,surgical history, family history and social history were all reviewed and documented in the EPIC chart.  Gynecologic History No LMP recorded. Patient is postmenopausal. Contraception: post menopausal status Last Pap: 2014. Results were: normal Last mammogram: 2015. Results were: Normal but densely had 3D  Obstetric History OB History  Gravida Para Term Preterm AB SAB TAB Ectopic Multiple Living  0                  ROS: A ROS was performed and pertinent positives and negatives are included in the history.  GENERAL: No fevers or chills. HEENT: No change in vision, no earache, sore throat or sinus congestion. NECK: No pain or stiffness. CARDIOVASCULAR: No chest pain or  pressure. No palpitations. PULMONARY: No shortness of breath, cough or wheeze. GASTROINTESTINAL: No abdominal pain, nausea, vomiting or diarrhea, melena or bright red blood per rectum. GENITOURINARY: No urinary frequency, urgency, hesitancy or dysuria. MUSCULOSKELETAL: No joint or muscle pain, no back pain, no recent trauma. DERMATOLOGIC: No rash, no itching, no lesions. ENDOCRINE: No polyuria, polydipsia, no heat or cold intolerance. No recent change in weight. HEMATOLOGICAL: No anemia or easy bruising or bleeding. NEUROLOGIC: No headache, seizures, numbness, tingling or weakness. PSYCHIATRIC: No depression, no loss of interest in normal activity or change in sleep pattern.     Exam: chaperone present  BP 142/86  Ht 5' 1.5" (1.562 m)  Wt 135 lb (61.236 kg)  BMI 25.10 kg/m2  Body mass index is 25.1 kg/(m^2).  General appearance : Well developed well nourished female. No acute distress HEENT: Neck supple, trachea midline, no carotid bruits, no thyroidmegaly Lungs: Clear to auscultation, no rhonchi or wheezes, or rib retractions  Heart: Regular rate and rhythm, no murmurs or gallops Breast:Examined in sitting and supine position were symmetrical in appearance, no palpable masses or tenderness,  no skin retraction, no nipple inversion, no nipple discharge, no skin discoloration, no axillary or supraclavicular lymphadenopathy Abdomen: no palpable masses or tenderness, no rebound or guarding Extremities: no edema or skin discoloration or tenderness  Pelvic:  Bartholin, Urethra, Skene Glands: Within normal limits, vaginal atrophy             Vagina: No gross lesions or discharge  Cervix:  No gross lesions or discharge  Uterus  anteverted, normal size, shape and consistency, non-tender and mobile  Adnexa  Without masses or tenderness  Anus and perineum  normal   Rectovaginal  normal sphincter tone without palpated masses or tenderness             Hemoccult PCP provides     Assessment/Plan:   68 y.o. female doing well with 2 mg Estring which she applies every 3 months. She is taking Provera 10 mg for 10 days when she comes in to change her Estring. She was reminded to do her monthly self breast examination. We discussed the importance of calcium and vitamin D and regular exercise for osteoporosis prevention. Due to the fact the patient had several years ago CIN-3 treated with cryotherapy I'm going to continue to do her Pap smears every year for close surveillance. Her primary physician will be doing her lab work.   Note: This dictation was prepared with  Dragon/digital dictation along withSmart phrase technology. Any transcriptional errors that result from this process are unintentional.   Terrance Mass MD, 3:57 PM 09/10/2013

## 2013-09-12 LAB — CYTOLOGY - PAP

## 2013-12-26 ENCOUNTER — Ambulatory Visit (INDEPENDENT_AMBULATORY_CARE_PROVIDER_SITE_OTHER): Payer: Medicare Other | Admitting: Gynecology

## 2013-12-26 ENCOUNTER — Encounter: Payer: Self-pay | Admitting: Gynecology

## 2013-12-26 VITALS — BP 136/82

## 2013-12-26 DIAGNOSIS — N951 Menopausal and female climacteric states: Secondary | ICD-10-CM

## 2013-12-26 DIAGNOSIS — N952 Postmenopausal atrophic vaginitis: Secondary | ICD-10-CM

## 2013-12-26 DIAGNOSIS — G4700 Insomnia, unspecified: Secondary | ICD-10-CM

## 2013-12-26 MED ORDER — ESTRADIOL 2 MG VA RING: 2.0000 mg | VAGINAL_RING | VAGINAL | Status: DC

## 2013-12-26 NOTE — Progress Notes (Signed)
   Patient presented to the office today for assistance of removing her Estring 2 mg which she has trouble removing every 3 months and comes to the office to have this removed. It was recommended that patient take Provera 10 mg daily for 10 days after the removal of the Estring every 3 months and this is what she is here for today. The vaginal estrogen has help her with her insomnia.  Pelvic exam:  Bartholin urethra Skene glands within normal limits  Vagina: Vagina well estrogenized Estring were removed no lesions or erosion or irritation or discharge seen.  Patient was reminded to take her Provera now 10 mg for the next 10 days and then insert Estring. She will return back in 3 months. She will check with her pharmacy to make sure we know when she was started on estrogen replacement therapy. We discussed women's health initiative study. We may need to begin tapering her off next year.

## 2014-03-27 ENCOUNTER — Ambulatory Visit (INDEPENDENT_AMBULATORY_CARE_PROVIDER_SITE_OTHER): Payer: Medicare Other | Admitting: Gynecology

## 2014-03-27 ENCOUNTER — Encounter: Payer: Self-pay | Admitting: Gynecology

## 2014-03-27 VITALS — BP 140/80

## 2014-03-27 DIAGNOSIS — Z79890 Hormone replacement therapy (postmenopausal): Secondary | ICD-10-CM

## 2014-03-27 DIAGNOSIS — N952 Postmenopausal atrophic vaginitis: Secondary | ICD-10-CM

## 2014-03-27 NOTE — Progress Notes (Signed)
   Patient presented to the office today for assistance of removing her Estring 2 mg which she has trouble removing every 3 months and comes to the office to have this removed. It was recommended that patient take Provera 10 mg daily for 10 days after the removal of the Estring every 3 months and this is what she is here for today. The vaginal estrogen has help her with her insomnia.  She states that she would like to come off the Estring altogether and use over-the-counter vaginal lubricants when necessary during intercourse. She reports no vaginal bleeding. She scheduled to return to the office in June for her annual exam. Before then she is due for her mammogram and her bone density study. Her flu vaccine is up-to-date. The Estring was removed and discarded.

## 2014-03-31 ENCOUNTER — Other Ambulatory Visit: Payer: Self-pay | Admitting: Gynecology

## 2014-03-31 ENCOUNTER — Telehealth: Payer: Self-pay | Admitting: *Deleted

## 2014-03-31 DIAGNOSIS — M8580 Other specified disorders of bone density and structure, unspecified site: Secondary | ICD-10-CM

## 2014-03-31 DIAGNOSIS — Z1231 Encounter for screening mammogram for malignant neoplasm of breast: Secondary | ICD-10-CM

## 2014-03-31 NOTE — Telephone Encounter (Signed)
Pt called requesting dexa order placed at [facility] hospital, this will be done and pt will schedule.

## 2014-04-16 ENCOUNTER — Ambulatory Visit (HOSPITAL_COMMUNITY)
Admission: RE | Admit: 2014-04-16 | Discharge: 2014-04-16 | Disposition: A | Discharge status: Home / Self Care | Payer: Medicare Other | Source: Ambulatory Visit | Attending: Gynecology | Admitting: Gynecology

## 2014-04-16 DIAGNOSIS — Z1231 Encounter for screening mammogram for malignant neoplasm of breast: Secondary | ICD-10-CM | POA: Diagnosis not present

## 2014-04-16 DIAGNOSIS — Z780 Asymptomatic menopausal state: Secondary | ICD-10-CM | POA: Diagnosis not present

## 2014-04-16 DIAGNOSIS — Z13820 Encounter for screening for osteoporosis: Secondary | ICD-10-CM | POA: Diagnosis present

## 2014-04-16 DIAGNOSIS — M8580 Other specified disorders of bone density and structure, unspecified site: Secondary | ICD-10-CM

## 2014-09-12 ENCOUNTER — Ambulatory Visit (INDEPENDENT_AMBULATORY_CARE_PROVIDER_SITE_OTHER): Payer: Medicare Other | Admitting: Gynecology

## 2014-09-12 ENCOUNTER — Encounter: Payer: Self-pay | Admitting: Gynecology

## 2014-09-12 ENCOUNTER — Other Ambulatory Visit (HOSPITAL_COMMUNITY)
Admission: RE | Admit: 2014-09-12 | Discharge: 2014-09-12 | Disposition: A | Discharge status: Home / Self Care | Payer: Medicare Other | Source: Ambulatory Visit | Attending: Gynecology | Admitting: Gynecology

## 2014-09-12 VITALS — BP 142/80 | Ht 61.0 in | Wt 133.0 lb

## 2014-09-12 DIAGNOSIS — M8580 Other specified disorders of bone density and structure, unspecified site: Secondary | ICD-10-CM

## 2014-09-12 DIAGNOSIS — Z124 Encounter for screening for malignant neoplasm of cervix: Secondary | ICD-10-CM | POA: Diagnosis present

## 2014-09-12 DIAGNOSIS — Z87410 Personal history of cervical dysplasia: Secondary | ICD-10-CM

## 2014-09-12 DIAGNOSIS — Z01419 Encounter for gynecological examination (general) (routine) without abnormal findings: Secondary | ICD-10-CM

## 2014-09-12 NOTE — Addendum Note (Signed)
Addended by: Burnett Kanaris on: 09/12/2014 03:07 PM   Modules accepted: Orders

## 2014-09-12 NOTE — Progress Notes (Signed)
Erika Ramsey [birth year] [id number]   History:    69 y.o.  for annual gyn exam with no complaints today. Patient no longer on hormone replacement therapy. Patient denies any vaginal bleeding. 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 Both her father and grandfather had colon cancer. Her last bone density study was in 2016 and the lowest T score was at the right femoral neck with a value of -1.3 normal Frax analysis. When compared with 2014 there was statistically significant improvement on the BMD in the AP spine and no change in the hips. Her PCP has been drawn her blood work. And her vaccines are up-to-date.  Past medical history,surgical history, family history and social history were all reviewed and documented in the EPIC chart.  Gynecologic History No LMP recorded. Patient is postmenopausal. Contraception: post menopausal status Last Pap: 2015. Results were: normal Last mammogram: 2016. Results were: normal  Obstetric History OB History  Gravida Para Term Preterm AB SAB TAB Ectopic Multiple Living  0                  ROS: A ROS was performed and pertinent positives and negatives are included in the history.  GENERAL: No fevers or chills. HEENT: No change in vision, no earache, sore throat or sinus congestion. NECK: No pain or stiffness. CARDIOVASCULAR: No chest pain or pressure. No palpitations. PULMONARY: No shortness of breath, cough or wheeze. GASTROINTESTINAL: No abdominal pain, nausea, vomiting or diarrhea, melena or bright red blood per rectum. GENITOURINARY: No urinary frequency, urgency, hesitancy or dysuria. MUSCULOSKELETAL: No joint or muscle pain, no back pain, no recent trauma. DERMATOLOGIC: No rash, no itching, no lesions. ENDOCRINE: No polyuria, polydipsia, no heat or cold intolerance. No recent change in weight. HEMATOLOGICAL: No anemia or easy bruising or  bleeding. NEUROLOGIC: No headache, seizures, numbness, tingling or weakness. PSYCHIATRIC: No depression, no loss of interest in normal activity or change in sleep pattern.     Exam: chaperone present  BP 142/80 mmHg  Ht 5\' 1"  (1.549 m)  Wt 133 lb (60.328 kg)  BMI 25.14 kg/m2  Body mass index is 25.14 kg/(m^2).  General appearance : Well developed well nourished female. No acute distress HEENT: Eyes: no retinal hemorrhage or exudates,  Neck supple, trachea midline, no carotid bruits, no thyroidmegaly Lungs: Clear to auscultation, no rhonchi or wheezes, or rib retractions  Heart: Regular rate and rhythm, no murmurs or gallops Breast:Examined in sitting and supine position were symmetrical in appearance, no palpable masses or tenderness,  no skin retraction, no nipple inversion, no nipple discharge, no skin discoloration, no axillary or supraclavicular lymphadenopathy Abdomen: no palpable masses or tenderness, no rebound or guarding Extremities: no edema or skin discoloration or tenderness  Pelvic:  Bartholin, Urethra, Skene Glands: Within normal limits             Vagina: No gross lesions or discharge, atrophic changes  Cervix: No gross lesions or discharge  Uterus  axial, normal size, shape and consistency, non-tender and mobile  Adnexa  Without masses or tenderness  Anus and perineum  normal   Rectovaginal  normal sphincter tone without palpated masses or tenderness             Hemoccult PCP provides     Assessment/Plan:  69 y.o. female for annual exam who has come off the HRT and doing well. Uses Vaseline when  necessary during intercourse. PCP we'll be doing her blood work. We discussed importance of calcium vitamin D and regular exercise for osteoporosis prevention. Because of past history of CIN-3 Pap smear was done today.   Terrance Mass MD, 2:39 PM 09/12/2014

## 2014-09-16 LAB — CYTOLOGY - PAP

## 2014-12-25 ENCOUNTER — Encounter: Payer: Self-pay | Admitting: Family Medicine

## 2014-12-25 ENCOUNTER — Telehealth: Payer: Self-pay | Admitting: Family Medicine

## 2014-12-25 ENCOUNTER — Ambulatory Visit (INDEPENDENT_AMBULATORY_CARE_PROVIDER_SITE_OTHER): Payer: Medicare Other | Admitting: Family Medicine

## 2014-12-25 ENCOUNTER — Telehealth: Payer: Self-pay

## 2014-12-25 VITALS — BP 132/92 | HR 58 | Temp 98.0°F | Resp 16 | Wt 132.0 lb

## 2014-12-25 DIAGNOSIS — I10 Essential (primary) hypertension: Secondary | ICD-10-CM | POA: Diagnosis not present

## 2014-12-25 DIAGNOSIS — Z23 Encounter for immunization: Secondary | ICD-10-CM

## 2014-12-25 DIAGNOSIS — Z5181 Encounter for therapeutic drug level monitoring: Secondary | ICD-10-CM | POA: Diagnosis not present

## 2014-12-25 DIAGNOSIS — M8580 Other specified disorders of bone density and structure, unspecified site: Secondary | ICD-10-CM | POA: Diagnosis not present

## 2014-12-25 DIAGNOSIS — R5383 Other fatigue: Secondary | ICD-10-CM

## 2014-12-25 LAB — CBC
HCT: 44.1 % (ref 36.0–46.0)
HEMOGLOBIN: 15 g/dL (ref 12.0–15.0)
MCH: 31.3 pg (ref 26.0–34.0)
MCHC: 34 g/dL (ref 30.0–36.0)
MCV: 91.9 fL (ref 78.0–100.0)
MPV: 10.6 fL (ref 8.6–12.4)
Platelets: 207 10*3/uL (ref 150–400)
RBC: 4.8 MIL/uL (ref 3.87–5.11)
RDW: 13.2 % (ref 11.5–15.5)
WBC: 5.9 10*3/uL (ref 4.0–10.5)

## 2014-12-25 LAB — LIPID PANEL
Cholesterol: 242 mg/dL — ABNORMAL HIGH (ref 125–200)
HDL: 90 mg/dL (ref >=46)
LDL Cholesterol: 137 mg/dL — ABNORMAL HIGH (ref <130)
TRIGLYCERIDES: 76 mg/dL (ref <150)
Total CHOL/HDL Ratio: 2.7 Ratio (ref <=5.0)
VLDL: 15 mg/dL (ref <30)

## 2014-12-25 LAB — COMPREHENSIVE METABOLIC PANEL
ALBUMIN: 4.3 g/dL (ref 3.6–5.1)
ALT: 21 U/L (ref 6–29)
AST: 23 U/L (ref 10–35)
Alkaline Phosphatase: 56 U/L (ref 33–130)
BILIRUBIN TOTAL: 0.6 mg/dL (ref 0.2–1.2)
BUN: 17 mg/dL (ref 7–25)
CALCIUM: 9.5 mg/dL (ref 8.6–10.4)
CO2: 27 mmol/L (ref 20–31)
Chloride: 102 mmol/L (ref 98–110)
Creat: 0.85 mg/dL (ref 0.60–0.93)
Glucose, Bld: 84 mg/dL (ref 65–99)
Potassium: 5.3 mmol/L (ref 3.5–5.3)
Sodium: 138 mmol/L (ref 135–146)
Total Protein: 7 g/dL (ref 6.1–8.1)

## 2014-12-25 LAB — TSH: TSH: 2.301 u[IU]/mL (ref 0.350–4.500)

## 2014-12-25 MED ORDER — LOSARTAN POTASSIUM 100 MG PO TABS: 100.0000 mg | ORAL_TABLET | Freq: Every day | ORAL | Status: DC

## 2014-12-25 MED ORDER — ALPRAZOLAM 0.25 MG PO TABS: 0.2500 mg | ORAL_TABLET | Freq: Every day | ORAL | Status: DC

## 2014-12-25 NOTE — Telephone Encounter (Signed)
PT called about a msg stating she had missed an appointment that she DID arrive for and complete. Informed pt there would be no charge for 'no show' as she was seen at [facility] 12/25/14 at 10:07 AM.  Thank you

## 2014-12-25 NOTE — Progress Notes (Signed)
Subjective:    Patient ID: Erika Ramsey, female    DOB: [birth year], 69 y.o.   MRN: [id number] Chief Complaint  Patient presents with  . Establish care  . bloodwork  . blood pressure    has been running high    HPI  Pt was being seen at [facility] for her medical care following Dr. [name] but she is no longer there so transferring her care back here.  Has been on losartan 100mg  for a year, she was on lisinopril and maybe another BP pill prior and doesn't know why she was switched.  Checks BP outside office and brought log here.  She checks her BP several times/mo but has a large cuff so unsure of its accuracy.  BP ranges from 101 where she becomes fatigued and then bouces up to 174/93 which is asymptomatic but most range 110s-140s/60s-70s. Drinks a lot of water and occ beer. She does limit salt and sugar.   Her sister has a. Fib and mother had MVP. Pt asymtpomatic.  [place] of the [place] 3 [place]rs [place] so knows her immunizations are UTD at that time.   Pt did have an episode of dyspnea many yrs prior w/ a nml EKG in our office at that time.  Depression screen PHQ 2/9 12/25/2014  Decreased Interest 0  Down, Depressed, Hopeless 0  PHQ - 2 Score 0   Past Medical History  Diagnosis Date  . Osteopenia 02/2012    T score -1.6 FRAX 9.6%/ 1.3%  . Atrophic vaginitis   . Decreased libido   . Hypertension   . 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 Breast cyst    Past Surgical History  Procedure Laterality Date  . Pelvic laparoscopy      Diag Lap  . Hand surgery    . Colposcopy  1977    CIN 3   Current Outpatient Prescriptions on File Prior to Visit  Medication Sig Dispense Refill  . B Complex Vitamins (VITAMIN B COMPLEX PO) Take 1 tablet by mouth daily.    . Calcium Carbonate-Vitamin D (CALCIUM + D PO) Take by mouth.      . Fish Oil-Cholecalciferol (FISH OIL + D3) 1200-1000 MG-UNIT CAPS Take by mouth.      . Multiple Vitamin (MULTIVITAMIN) capsule Take 1 capsule by mouth  daily.      Marland Kitchen aspirin 81 MG tablet Take 81 mg by mouth daily.     No current facility-administered medications on file prior to visit.   Not on File Family History  Problem Relation Age of Onset  . Hypertension Mother   . Heart disease Mother   . Cancer Father     Colon cancer  . Heart disease Father   . Diabetes Maternal Grandmother   . Cancer Paternal Grandfather     Colon cancer  . Breast cancer Maternal Aunt     Great aunt 74's   Social History   Social History  . Marital Status: Single    Spouse Name: N/A  . Number of Children: N/A  . Years of Education: N/A   Social History Main Topics  . Smoking status: Former Research scientist (life sciences)  . Smokeless tobacco: None  . Alcohol Use: 3.6 oz/week    6 Standard drinks or equivalent per week  . Drug Use: No  . Sexual Activity: Yes    Birth Control/ Protection: Post-menopausal     Comment: intercourse age 60, sexual partners less than 5   Other Topics  Concern  . None   Social History Narrative      Review of Systems  Constitutional: Positive for fatigue. Negative for fever, chills, diaphoresis and appetite change.  Eyes: Negative for visual disturbance.  Respiratory: Negative for cough and shortness of breath.   Cardiovascular: Negative for chest pain, palpitations and leg swelling.  Endocrine: Negative for polydipsia.  Genitourinary: Negative for decreased urine volume.  Neurological: Negative for syncope and headaches.  Hematological: Does not bruise/bleed easily.  Psychiatric/Behavioral: Positive for sleep disturbance. Negative for dysphoric mood. The patient is nervous/anxious.        Objective:  BP 132/92 mmHg  Pulse 58  Temp(Src) 98 F (36.7 C) (Oral)  Resp 16  Wt 132 lb (59.875 kg)  Physical Exam  Constitutional: She is oriented to person, place, and time. She appears well-developed and well-nourished. No distress.  HENT:  Head: Normocephalic and atraumatic.  Right Ear: External ear normal.  Left Ear: External  ear normal.  Eyes: Conjunctivae are normal. No scleral icterus.  Neck: Normal range of motion. Neck supple. No thyromegaly present.  Cardiovascular: Normal rate, regular rhythm, normal heart sounds and intact distal pulses.   Pulmonary/Chest: Effort normal and breath sounds normal. No respiratory distress.  Musculoskeletal: She exhibits no edema.  Lymphadenopathy:    She has no cervical adenopathy.  Neurological: She is alert and oriented to person, place, and time.  Skin: Skin is warm and dry. She is not diaphoretic. No erythema.  Psychiatric: She has a normal mood and affect. Her behavior is normal.         [facility] reading (PRIMARY) by  Dr. Brigitte Pulse. EKG: Sinus bradycardia, no ischemic change. Assessment & Plan:   1. Need for prophylactic vaccination and inoculation against influenza   2. Essential hypertension - cont losartan 100  3. Osteopenia   4. Other fatigue   5. Medication monitoring encounter   6. Need for prophylactic vaccination against Streptococcus pneumoniae (pneumococcus)   Gt records from [facility].  Orders Placed This Encounter  Procedures  . Flu Vaccine QUAD 36+ mos IM  . Pneumococcal conjugate vaccine 13-valent IM  . Comprehensive metabolic panel    Order Specific Question:  Has the patient fasted?    Answer:  Yes  . Lipid panel    Order Specific Question:  Has the patient fasted?    Answer:  Yes  . CBC  . TSH  . Vit D  25 hydroxy (rtn osteoporosis monitoring)  . EKG 12-Lead    Meds ordered this encounter  Medications  . losartan (COZAAR) 100 MG tablet    Sig: Take 1 tablet (100 mg total) by mouth daily.    Dispense:  90 tablet    Refill:  3  . ALPRAZolam (XANAX) 0.25 MG tablet    Sig: Take 1 tablet (0.25 mg total) by mouth daily.    Dispense:  90 tablet    Refill:  1    Delman Cheadle, MD MPH

## 2014-12-25 NOTE — Telephone Encounter (Signed)
Pt was a N/S so I LMOM for patient to reschedule her appt with Dr Brigitte Pulse

## 2014-12-26 LAB — VITAMIN D 25 HYDROXY (VIT D DEFICIENCY, FRACTURES): Vit D, 25-Hydroxy: 40 ng/mL (ref 30–100)

## 2015-04-08 ENCOUNTER — Ambulatory Visit (INDEPENDENT_AMBULATORY_CARE_PROVIDER_SITE_OTHER): Payer: Medicare Other | Admitting: Family Medicine

## 2015-04-08 VITALS — BP 138/70 | HR 93 | Temp 99.9°F | Resp 17 | Ht 62.0 in | Wt 133.0 lb

## 2015-04-08 DIAGNOSIS — R509 Fever, unspecified: Secondary | ICD-10-CM

## 2015-04-08 DIAGNOSIS — I10 Essential (primary) hypertension: Secondary | ICD-10-CM

## 2015-04-08 DIAGNOSIS — R05 Cough: Secondary | ICD-10-CM

## 2015-04-08 DIAGNOSIS — R059 Cough, unspecified: Secondary | ICD-10-CM

## 2015-04-08 DIAGNOSIS — J209 Acute bronchitis, unspecified: Secondary | ICD-10-CM | POA: Diagnosis not present

## 2015-04-08 LAB — POCT INFLUENZA A/B
Influenza A, POC: NEGATIVE
Influenza B, POC: NEGATIVE

## 2015-04-08 MED ORDER — DOXYCYCLINE HYCLATE 100 MG PO CAPS: 100.0000 mg | ORAL_CAPSULE | Freq: Two times a day (BID) | ORAL | Status: DC

## 2015-04-08 MED ORDER — LOSARTAN POTASSIUM 100 MG PO TABS: 100.0000 mg | ORAL_TABLET | Freq: Every day | ORAL | Status: DC

## 2015-04-08 NOTE — Patient Instructions (Addendum)
It does look like you have bronchtiis.   We are going to treat you with doxycycline (antibiotic), and mucinex as needed to help loosen mucus Let me know if you are not feeling better in the next few days- Sooner if worse.   It would be my pleasure to continue to see you as a patient at my new office, starting the last week of February.  If you prefer to remain at [facility] one of my partners will be happy to see you here.   [facility] Primary [facility] at [facility] [address] [place], [place] [address] Phone: [phone]

## 2015-04-08 NOTE — Progress Notes (Signed)
[facility] [address], [place] [address] [phone]  Date:  04/08/2015   Name:  Erika Ramsey   DOB:  1945/05/07   MRN:  [id number]  PCP:  [name]., MD    Chief Complaint: Chest Pain; Cough; and Laryngitis   History of Present Illness:  Erika Ramsey is a 70 y.o. very pleasant female patient who presents with the following:  She is here today with illness- she has noted chest congestion, cough, sx started 36 hours ago and have gotten worse She has noted a mild fever but has not felt like she had the flu She is not having body ahces but she does have some chills The cough is the main concern The cough is productive but she has a hard time getting her mucus out No GI symptoms No URI sx- sx are really just in her chest  She has used an otc cold prep- her last dose was 4 hours ago No sick contacts Former smoker  Patient Active Problem List   Diagnosis Date Noted  . History of cervical dysplasia 08/07/2012  . Osteopenia   . Atrophic vaginitis   . Hypertension     Past Medical History  Diagnosis Date  . Osteopenia 02/2012    T score -1.6 FRAX 9.6%/ 1.3%  . Atrophic vaginitis   . Decreased libido   . Hypertension   . 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 Breast cyst     Past Surgical History  Procedure Laterality Date  . Pelvic laparoscopy      Diag Lap  . Hand surgery    . Colposcopy  1977    CIN 3    Social History  Substance Use Topics  . Smoking status: Former Research scientist (life sciences)  . Smokeless tobacco: None  . Alcohol Use: 3.6 oz/week    6 Standard drinks or equivalent per week    Family History  Problem Relation Age of Onset  . Hypertension Mother   . Heart disease Mother   . Cancer Father     Colon cancer  . Heart disease Father   . Diabetes Maternal Grandmother   . Cancer Paternal Grandfather     Colon cancer  . Breast cancer Maternal Aunt     Great aunt 75's    No Known Allergies  Medication list has been reviewed  and updated.  Current Outpatient Prescriptions on File Prior to Visit  Medication Sig Dispense Refill  . ALPRAZolam (XANAX) 0.25 MG tablet Take 1 tablet (0.25 mg total) by mouth daily. 90 tablet 1  . aspirin 81 MG tablet Take 81 mg by mouth daily.    . Calcium Carbonate-Vitamin D (CALCIUM + D PO) Take by mouth.      . Fish Oil-Cholecalciferol (FISH OIL + D3) 1200-1000 MG-UNIT CAPS Take by mouth.      . losartan (COZAAR) 100 MG tablet Take 1 tablet (100 mg total) by mouth daily. 90 tablet 3  . Multiple Vitamin (MULTIVITAMIN) capsule Take 1 capsule by mouth daily.       No current facility-administered medications on file prior to visit.    Review of Systems:  As per HPI- otherwise negative.   Physical Examination: Filed Vitals:   04/08/15 1136  BP: 138/70  Pulse: 93  Temp: 99.9 F (37.7 C)  Resp: 17   Filed Vitals:   04/08/15 1136  Height: 5\' 2"  (1.575 m)  Weight: 133 lb (60.328 kg)   Body  mass index is 24.32 kg/(m^2). Ideal Body Weight: Weight in (lb) to have BMI = 25: 136.4  GEN: WDWN, NAD, Non-toxic, A & O x 3, looks well but does have some cough HEENT: Atraumatic, Normocephalic. Neck supple. No masses, No LAD.  Bilateral TM wnl, oropharynx inflamed but no exudate PEERL,EOMI.   Ears and Nose: No external deformity. CV: RRR, No M/G/R. No JVD. No thrill. No extra heart sounds. PULM: CTA B, no wheezes, crackles, rhonchi. No retractions. No resp. distress. No accessory muscle use. EXTR: No c/c/e NEURO Normal gait.  PSYCH: Normally interactive. Conversant. Not depressed or anxious appearing.  Calm demeanor.   Results for orders placed or performed in visit on 04/08/15  POCT Influenza A/B  Result Value Ref Range   Influenza A, POC Negative Negative   Influenza B, POC Negative Negative    Assessment and Plan: Acute bronchitis, unspecified organism - Plan: doxycycline (VIBRAMYCIN) 100 MG capsule  Cough - Plan: POCT Influenza A/B  Low grade fever - Plan: POCT  Influenza A/B  Essential hypertension - Plan: losartan (COZAAR) 100 MG tablet  Treat for bronchitis with doxycycline Her cozaar needed a refill Gave samples of plain mucinex to use as needed followup if not better  Signed Lamar Blinks, MD

## 2015-04-14 ENCOUNTER — Encounter: Payer: Self-pay | Admitting: Family Medicine

## 2015-04-17 ENCOUNTER — Other Ambulatory Visit: Payer: Self-pay

## 2015-04-17 DIAGNOSIS — Z1231 Encounter for screening mammogram for malignant neoplasm of breast: Secondary | ICD-10-CM

## 2015-05-06 ENCOUNTER — Ambulatory Visit
Admission: RE | Admit: 2015-05-06 | Discharge: 2015-05-06 | Disposition: A | Discharge status: Home / Self Care | Payer: Medicare Other | Source: Ambulatory Visit

## 2015-05-06 DIAGNOSIS — Z1231 Encounter for screening mammogram for malignant neoplasm of breast: Secondary | ICD-10-CM

## 2015-06-18 ENCOUNTER — Ambulatory Visit (INDEPENDENT_AMBULATORY_CARE_PROVIDER_SITE_OTHER)
Admission: RE | Admit: 2015-06-18 | Discharge: 2015-06-18 | Disposition: A | Discharge status: Home / Self Care | Payer: Medicare Other | Source: Ambulatory Visit | Attending: Family Medicine | Admitting: Family Medicine

## 2015-06-18 ENCOUNTER — Ambulatory Visit (INDEPENDENT_AMBULATORY_CARE_PROVIDER_SITE_OTHER): Payer: Medicare Other | Admitting: Family Medicine

## 2015-06-18 VITALS — BP 122/84 | HR 66 | Wt 133.0 lb

## 2015-06-18 DIAGNOSIS — M24859 Other specific joint derangements of unspecified hip, not elsewhere classified: Secondary | ICD-10-CM | POA: Insufficient documentation

## 2015-06-18 DIAGNOSIS — G8929 Other chronic pain: Secondary | ICD-10-CM | POA: Diagnosis not present

## 2015-06-18 DIAGNOSIS — M545 Low back pain, unspecified: Secondary | ICD-10-CM

## 2015-06-18 DIAGNOSIS — M25551 Pain in right hip: Secondary | ICD-10-CM | POA: Diagnosis not present

## 2015-06-18 DIAGNOSIS — M24851 Other specific joint derangements of right hip, not elsewhere classified: Secondary | ICD-10-CM

## 2015-06-18 MED ORDER — GABAPENTIN 100 MG PO CAPS: 200.0000 mg | ORAL_CAPSULE | Freq: Every day | ORAL | Status: DC

## 2015-06-18 MED ORDER — MELOXICAM 15 MG PO TABS: 15.0000 mg | ORAL_TABLET | Freq: Every day | ORAL | Status: DC

## 2015-06-18 NOTE — Patient Instructions (Addendum)
Good to see you.  Ice 20 minutes 2 times daily. Usually after activity and before bed. Thigh compression sleeve can help Meloxicam daily for 10 days then as needed.  Xrays downstairs of back and right hip Exercises 3 times a week Yoga only 1-2 times a week until I see you again in 3 weeks.

## 2015-06-18 NOTE — Assessment & Plan Note (Signed)
Should be contributing to some of her discomfort. We discussed home exercises, icing protocol, which activities doing which still avoid. I do think that there is a possibility for lumbar radiculopathy. X-rays ordered and patient will be started on gabapentin. We will see patient back again in 3-4 weeks.

## 2015-06-18 NOTE — Assessment & Plan Note (Signed)
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 Mild limitation in internal range of motion. This could be contributing to some of the discomfort and we will get x-rays to further evaluate for any type of intra-articular pathology likely conjoining. Differential also includes a lumbar radiculopathy. Patient has negative straight leg test and full strength today. X-rays the lumbar spine was ordered as well. Patient will start on anti-inflammatories as well as gabapentin. I think these will be beneficial for patient. She'll come back in 2-3 weeks for further evaluation and treatment. Learn home exercises from athletic trainer today.

## 2015-06-18 NOTE — Progress Notes (Signed)
[facility] [address] [place], [place] [address] Phone: [phone] Subjective:    I'm seeing this patient by the request  of:  RICHTER,KAREN L., MD   CC: right hip pain  [id number] Erika Ramsey is a 70 y.o. female coming in with complaint of right hip pain. Been going on for proximal main 3 months. Seems to be worsening somewhat. Was doing more of an exercise program where she was walking more frequently. Maybe some more hills. States that it seems to be giving her more pain in the dull throbbing aching sensation on the anterior lateral hip. Mild radiation down the leg from time to time and does have a history of chronic back pain. Patient denies any true groin pain. States that she feels she still has full range of motion. Just feels that sometimes her hip feels unstable for quick second and then seems to go away when she gets better with walking. Denies any nighttime awakening. Feels though that it seems to be worsening.     Past Medical History  Diagnosis Date  . Osteopenia 02/2012    T score -1.6 FRAX 9.6%/ 1.3%  . Atrophic vaginitis   . Decreased libido   . Hypertension   . 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 Breast cyst    Past Surgical History  Procedure Laterality Date  . Pelvic laparoscopy      Diag Lap  . Hand surgery    . Colposcopy  1977    CIN 3   Social History   Social History  . Marital Status: Single    Spouse Name: N/A  . Number of Children: N/A  . Years of Education: N/A   Social History Main Topics  . Smoking status: Former Research scientist (life sciences)  . Smokeless tobacco: Not on file  . Alcohol Use: 3.6 oz/week    6 Standard drinks or equivalent per week  . Drug Use: No  . Sexual Activity: Yes    Birth Control/ Protection: Post-menopausal     Comment: intercourse age 38, sexual partners less than 5   Other Topics Concern  . Not on file   Social History Narrative   No Known Allergies Family History  Problem Relation  Age of Onset  . Hypertension Mother   . Heart disease Mother   . Cancer Father     Colon cancer  . Heart disease Father   . Diabetes Maternal Grandmother   . Cancer Paternal Grandfather     Colon cancer  . Breast cancer Maternal Aunt     Great aunt 22's    Past medical history, social, surgical and family history all reviewed in electronic medical record.  No pertanent information unless stated regarding to the chief complaint.   Review of Systems: No headache, visual changes, nausea, vomiting, diarrhea, constipation, dizziness, abdominal pain, skin rash, fevers, chills, night sweats, weight loss, swollen lymph nodes, body aches, joint swelling, muscle aches, chest pain, shortness of breath, mood changes.   Objective Blood pressure 122/84, pulse 66, weight 133 lb (60.328 kg).  General: No apparent distress alert and oriented x3 mood and affect normal, dressed appropriately.  HEENT: Pupils equal, extraocular movements intact  Respiratory: Patient's speak in full sentences and does not appear short of breath  Cardiovascular: No lower extremity edema, non tender, no erythema  Skin: Warm dry intact with no signs of infection or rash on extremities or on axial skeleton.  Abdomen: Soft nontender  Neuro: Cranial  nerves II through XII are intact, neurovascularly intact in all extremities with 2+ DTRs and 2+ pulses.  Lymph: No lymphadenopathy of posterior or anterior cervical chain or axillae bilaterally.  Gait normal with good balance and coordination.  MSK:  Non tender with full range of motion and good stability and symmetric strength and tone of shoulders, elbows, wrist, , knee and ankles bilaterally.  [id number] ROM IR: 25 Deg, ER: 35 Deg, Flexion: 100 Deg, Extension: 80 Deg, Abduction: 45 Deg, Adduction: 15 Deg Strength IR: 5/5, ER: 5/5, Flexion: 5/5, Extension: 5/5, Abduction: 5/5, Adduction: 45 Pelvic alignment unremarkable to inspection and palpation. Standing hip rotation and gait  without trendelenburg sign / unsteadiness. Greater trochanter without tenderness to palpation. More pain over the tensor fascia lata in the groin area. Mild discomfort of the sacroiliac joints bilaterally as well as the paraspinal musculature on the right side of the lumbar spine. No spinous process tenderness. Negative straight leg test. Contralateral hip unremarkable  Procedure note 97110; 15 minutes spent for Therapeutic exercises as stated in above notes.  This included exercises focusing on stretching, strengthening, with significant focus on eccentric aspects.  Pelvic tilt/bracing instruction to focus on control of the pelvic girdle and lower abdominal muscles  Glute strengthening exercises, focusing on proper firing of the glutes without engaging the low back muscles Proper stretching techniques for maximum relief for the hamstrings, hip flexors, low back and some rotation where tolerated Proper technique shown and discussed handout in great detail with ATC.  All questions were discussed and answered.     Impression and Recommendations:     This case required medical decision making of moderate complexity.      Note: This dictation was prepared with Dragon dictation along with smaller phrase technology. Any transcriptional errors that result from this process are unintentional.

## 2015-06-25 ENCOUNTER — Ambulatory Visit (INDEPENDENT_AMBULATORY_CARE_PROVIDER_SITE_OTHER): Payer: Medicare Other | Admitting: Family Medicine

## 2015-06-25 VITALS — BP 201/75 | HR 61 | Temp 98.5°F | Resp 16 | Ht 62.0 in | Wt 130.0 lb

## 2015-06-25 DIAGNOSIS — Z130 Encounter for screening for diseases of the blood and blood-forming organs and certain disorders involving the immune mechanism: Secondary | ICD-10-CM

## 2015-06-25 DIAGNOSIS — Z1239 Encounter for other screening for malignant neoplasm of breast: Secondary | ICD-10-CM | POA: Diagnosis not present

## 2015-06-25 DIAGNOSIS — Z1389 Encounter for screening for other disorder: Secondary | ICD-10-CM | POA: Diagnosis not present

## 2015-06-25 DIAGNOSIS — Z1329 Encounter for screening for other suspected endocrine disorder: Secondary | ICD-10-CM | POA: Diagnosis not present

## 2015-06-25 DIAGNOSIS — Z0000 Encounter for general adult medical examination without abnormal findings: Secondary | ICD-10-CM

## 2015-06-25 DIAGNOSIS — Z1211 Encounter for screening for malignant neoplasm of colon: Secondary | ICD-10-CM | POA: Diagnosis not present

## 2015-06-25 DIAGNOSIS — I10 Essential (primary) hypertension: Secondary | ICD-10-CM

## 2015-06-25 DIAGNOSIS — I160 Hypertensive urgency: Secondary | ICD-10-CM

## 2015-06-25 DIAGNOSIS — M8580 Other specified disorders of bone density and structure, unspecified site: Secondary | ICD-10-CM

## 2015-06-25 DIAGNOSIS — Z1383 Encounter for screening for respiratory disorder NEC: Secondary | ICD-10-CM | POA: Diagnosis not present

## 2015-06-25 DIAGNOSIS — Z136 Encounter for screening for cardiovascular disorders: Secondary | ICD-10-CM | POA: Diagnosis not present

## 2015-06-25 DIAGNOSIS — Z113 Encounter for screening for infections with a predominantly sexual mode of transmission: Secondary | ICD-10-CM | POA: Diagnosis not present

## 2015-06-25 DIAGNOSIS — Z1212 Encounter for screening for malignant neoplasm of rectum: Secondary | ICD-10-CM

## 2015-06-25 LAB — POC MICROSCOPIC URINALYSIS (UMFC): MUCUS RE: ABSENT

## 2015-06-25 LAB — BASIC METABOLIC PANEL
BUN: 13 mg/dL (ref 7–25)
CALCIUM: 9.8 mg/dL (ref 8.6–10.4)
CO2: 27 mmol/L (ref 20–31)
Chloride: 100 mmol/L (ref 98–110)
Creat: 1.01 mg/dL — ABNORMAL HIGH (ref 0.60–0.93)
Glucose, Bld: 88 mg/dL (ref 65–99)
POTASSIUM: 4.3 mmol/L (ref 3.5–5.3)
SODIUM: 138 mmol/L (ref 135–146)

## 2015-06-25 LAB — LIPID PANEL
CHOLESTEROL: 233 mg/dL — AB (ref 125–200)
HDL: 99 mg/dL (ref >=46)
LDL CALC: 119 mg/dL (ref <130)
TRIGLYCERIDES: 74 mg/dL (ref <150)
Total CHOL/HDL Ratio: 2.4 Ratio (ref <=5.0)
VLDL: 15 mg/dL (ref <30)

## 2015-06-25 LAB — POCT URINALYSIS DIP (MANUAL ENTRY)
BILIRUBIN UA: NEGATIVE
Blood, UA: NEGATIVE
GLUCOSE UA: NEGATIVE
Ketones, POC UA: NEGATIVE
Nitrite, UA: NEGATIVE
Protein Ur, POC: NEGATIVE
Spec Grav, UA: <=1.005
Urobilinogen, UA: 0.2
pH, UA: 6.5

## 2015-06-25 MED ORDER — CLONIDINE HCL 0.1 MG PO TABS: 0.1000 mg | ORAL_TABLET | Freq: Two times a day (BID) | ORAL | Status: DC

## 2015-06-25 MED ORDER — CLONIDINE HCL 0.1 MG PO TABS
0.1000 mg | ORAL_TABLET | Freq: Once | ORAL | Status: AC
  Administered 2015-06-25: 0.1 mg via ORAL

## 2015-06-25 NOTE — Progress Notes (Addendum)
Subjective:    Erika Ramsey is a 70 y.o. female who presents for Medicare Annual/Subsequent preventive examination.  Preventive Screening-Counseling & Management  Tobacco History  Smoking status  . Former Smoker  Smokeless tobacco  . Not on file     Problems Prior to Visit 1. Started meloxicam and gabapentin last wk.  2. BP this month was 117-131 outside office and cuff accurate.  Current Problems (verified) Patient Active Problem List   Diagnosis Date Noted  . 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 Chronic low back pain 06/18/2015  . History of cervical dysplasia 08/07/2012  . Osteopenia   . Atrophic vaginitis   . Hypertension     Medications Prior to Visit Current Outpatient Prescriptions on File Prior to Visit  Medication Sig Dispense Refill  . ALPRAZolam (XANAX) 0.25 MG tablet Take 1 tablet (0.25 mg total) by mouth daily. 90 tablet 1  . aspirin 81 MG tablet Take 81 mg by mouth daily.    . Calcium Carbonate-Vitamin D (CALCIUM + D PO) Take by mouth.      . Fish Oil-Cholecalciferol (FISH OIL + D3) 1200-1000 MG-UNIT CAPS Take by mouth.      . gabapentin (NEURONTIN) 100 MG capsule Take 2 capsules (200 mg total) by mouth at bedtime. 60 capsule 3  . losartan (COZAAR) 100 MG tablet Take 1 tablet (100 mg total) by mouth daily. 90 tablet 3  . meloxicam (MOBIC) 15 MG tablet Take 1 tablet (15 mg total) by mouth daily. 30 tablet 0  . Multiple Vitamin (MULTIVITAMIN) capsule Take 1 capsule by mouth daily.       No current facility-administered medications on file prior to visit.    Current Medications (verified) Current Outpatient Prescriptions  Medication Sig Dispense Refill  . ALPRAZolam (XANAX) 0.25 MG tablet Take 1 tablet (0.25 mg total) by mouth daily. 90 tablet 1  . aspirin 81 MG tablet Take 81 mg by mouth daily.    . Calcium Carbonate-Vitamin D (CALCIUM + D PO) Take by mouth.      . Fish Oil-Cholecalciferol (FISH OIL + D3) 1200-1000 MG-UNIT CAPS Take by mouth.      .  gabapentin (NEURONTIN) 100 MG capsule Take 2 capsules (200 mg total) by mouth at bedtime. 60 capsule 3  . losartan (COZAAR) 100 MG tablet Take 1 tablet (100 mg total) by mouth daily. 90 tablet 3  . meloxicam (MOBIC) 15 MG tablet Take 1 tablet (15 mg total) by mouth daily. 30 tablet 0  . Multiple Vitamin (MULTIVITAMIN) capsule Take 1 capsule by mouth daily.       No current facility-administered medications for this visit.     Allergies (verified) Review of patient's allergies indicates no known allergies.   PAST HISTORY  Family History Family History  Problem Relation Age of Onset  . Hypertension Mother   . Heart disease Mother   . Cancer Father     Colon cancer  . Heart disease Father   . Diabetes Maternal Grandmother   . Cancer Paternal Grandfather     Colon cancer  . Breast cancer Maternal Aunt     Great aunt 76's    Social History Social History  Substance Use Topics  . Smoking status: Former Research scientist (life sciences)  . Smokeless tobacco: Not on file  . Alcohol Use: 3.6 oz/week    6 Standard drinks or equivalent per week     Are there smokers in your home (other than you)? No  Risk Factors Current exercise habits: The patient  does not participate in regular exercise at present. She is active doing yoga but unable to exercise as much with her current hip and back problems but plans to restart. Dietary issues discussed: "very good", low salt, no sugar, tries to get in more saturated foods   Cardiac risk factors: advanced age (older than 4 for men, 12 for women), dyslipidemia and hypertension.    Depression Screen Depression screen [facility] 2/9 06/25/2015 04/08/2015 12/25/2014  Decreased Interest 0 0 0  Down, Depressed, Hopeless 0 0 0  PHQ - 2 Score 0 0 0   (Note: if answer to either of the following is "Yes", a more complete depression screening is indicated)   Over the past two weeks, have you felt down, depressed or hopeless? No  Over the past two weeks, have you felt little interest  or pleasure in doing things? No  Have you lost interest or pleasure in daily life? No  Do you often feel hopeless? No  Do you cry easily over simple problems? No  Activities of Daily Living In your present state of health, do you have any difficulty performing the following activities?:  Driving? No Managing money?  No Feeding yourself? No Getting from bed to chair? No Climbing a flight of stairs? No Preparing food and eating?: No Bathing or showering? No - shower not set up  No bars Getting dressed: No Getting to the toilet? No Using the toilet:No Moving around from place to place: No - can move as long as she is on the anti-inflammatory meloxicam which she just started 1 month ago, dry needling and PT helped  In the past year have you fallen or had a near fall?:No   Are you sexually active?  No  Do you have more than one partner?  No  Hearing Difficulties: No Do you often ask people to speak up or repeat themselves? No Do you experience ringing or noises in your ears? No Do you have difficulty understanding soft or whispered voices? No   Do you feel that you have a problem with memory? No  Do you often misplace items? Yes - no more than usu  Do you feel safe at home?  Yes  Cognitive Testing  Alert? Yes  Normal Appearance?Yes  Oriented to person? Yes  Place? Yes   Time? Yes  Recall of three objects?  Yes  Can perform simple calculations? Yes  Displays appropriate judgment?Yes  Can read the correct time from a watch face?Yes   Advanced Directives have been discussed with the patient? Yes  HC POA is sister and if she is unable to provide, nephews.  Does not want to be kept on machines if poor prognosis or unlikely to return for normal function. Ok for short term interventions.    List the Names of Other Physician/Practitioners you currently use: 1.  Dr. Tamala Julian - ortho 2. Dr. Addison Naegeli - gyn. 3.  Dr. Dollene Cleveland - opthamologist 4.  Dr. Toy Cookey dentist 5. Dr. Oletta Lamas -  GI  Indicate any recent Medical Services you may have received from other than [facility] providers in the past year (date may be approximate). Physical Therapy done at a walk-in clinic [place] earlier this [place]   Immunization History  Administered Date(s) Administered  . Hep A / Hep B 10/06/2009  . Influenza Split 12/21/2010  . Influenza,inj,Quad PF,36+ Mos 12/17/2013, 12/25/2014  . Pneumococcal Conjugate-13 12/25/2014  . Pneumococcal-Unspecified 03/07/2013  . Tdap 07/06/2009  . Zoster 09/10/2008  Screening Tests Health Maintenance  Topic Date Due  . Hepatitis C Screening  [birth year]  . INFLUENZA VACCINE  09/29/2015  . PAP SMEAR  09/11/2016  . MAMMOGRAM  05/05/2017  . TETANUS/TDAP  07/07/2019  . COLONOSCOPY  05/06/2020  . DEXA SCAN  Completed  . ZOSTAVAX  Completed  . PNA vac Low Risk Adult  Completed    All answers were reviewed with the patient and necessary referrals were made:  Jwan Hornbaker, MD   06/25/2015   History reviewed: allergies, current medications, past family history, past medical history, past social history, past surgical history and problem list  Review of Systems Pertinent items are noted in HPI.    Objective:     BP 201/75 mmHg  Pulse 61  Temp(Src) 98.5 F (36.9 C)  Resp 16  Ht 5\' 2"  (1.575 m)  Wt 130 lb (58.968 kg)  BMI 23.77 kg/m2  Visual Acuity Screening   Right eye Left eye Both eyes  Without correction:  20/70 20/40  With correction: 20/30     BP 201/75 mmHg  Pulse 61  Temp(Src) 98.5 F (36.9 C)  Resp 16  Ht 5\' 2"  (1.575 m)  Wt 130 lb (58.968 kg)  BMI 23.77 kg/m2  General Appearance:    Alert, cooperative, no distress, appears stated age  Head:    Normocephalic, without obvious abnormality, atraumatic  Eyes:    PERRL, conjunctiva/corneas clear, EOM's intact, fundi    benign, both eyes  Ears:    Normal TM's and external ear canals, both ears  Nose:   Nares normal, septum midline, mucosa normal, no drainage     or sinus tenderness  Throat:   Lips, mucosa, and tongue normal; teeth and gums normal  Neck:   Supple, symmetrical, trachea midline, no adenopathy;    thyroid:  no enlargement/tenderness/nodules; no carotid   bruit or JVD  Back:     Symmetric, no curvature, ROM normal, no CVA tenderness  Lungs:     Clear to auscultation bilaterally, respirations unlabored  Chest Wall:    No tenderness or deformity   Heart:    Regular rate and rhythm, S1 and S2 normal, no murmur, rub   or gallop  Breast Exam:    No tenderness, masses, or nipple abnormality  Abdomen:     Soft, non-tender, bowel sounds active all four quadrants,    no masses, no organomegaly        Extremities:   Extremities normal, atraumatic, no cyanosis or edema  Pulses:   2+ and symmetric all extremities  Skin:   Skin color, texture, turgor normal, no rashes or lesions  Lymph nodes:   Cervical, supraclavicular, and axillary nodes normal  Neurologic:   CNII-XII intact, normal strength, sensation and reflexes    throughout  EKG: NSR, no acute ischemic change, unchanged from prior done 12/25/2014     Assessment:     1. Annual physical exam   2. Routine screening for STI (sexually transmitted infection)   3. Screening for cardiovascular, respiratory, and genitourinary diseases   4. Screening for colorectal cancer   5. Screening for deficiency anemia   6. Screening for thyroid disorder   7. Screening for breast cancer   8. Osteopenia   9. Essential hypertension   10. Hypertensive urgency     osteopenia     Plan:  Lipid? Hep C?   During the course of the visit the patient was educated and counseled about appropriate screening and preventive services including:  EKG done 12/25/2014 - repeated today due to hypertensive urgency prevnar done 2016 [facility]: 05/06/15 neg DEXA done by Dr. Toney Rakes, gyn 04/16/2014 Wants to try to work on exercise CRS: scheduled for Sept 2017 - Dr. Oletta Lamas Diet review for nutrition referral? Yes  ____  Not Indicated ____   Patient Instructions (the written plan) was given to the patient.  Medicare Attestation I have personally reviewed: The patient's medical and social history Their use of alcohol, tobacco or illicit drugs Their current medications and supplements The patient's functional ability including ADLs,fall risks, home safety risks, cognitive, and hearing and visual impairment Diet and physical activities Evidence for depression or mood disorders  The patient's weight, height, BMI, and visual acuity have been recorded in the chart.  I have made referrals, counseling, and provided education to the patient based on review of the above and I have provided the patient with a written personalized care plan for preventive services.     Delman Cheadle, MD   06/25/2015

## 2015-06-25 NOTE — Patient Instructions (Addendum)
Please monitor your blood pressure about 3-4x/day for the next few days.  The meloxicam will gradually come out of your system over the next 3-4 days so by Monday I am hoping that your blood pressure is back down to its usual range ([id number] systolic).  After you take a clonidine, it should take effect within 30 minutes but how long it lasts varies drastically in different people - anywhere from 6 hours to one day - this is why we will have you check your blood pressure and only take a clonidine if you systolic BP  0000000, then wait 3-4 hours before rechecking your blood pressure to give a good idea of how well it worked.  Do not take more than 2 clonidine a day (many people are on more but I expect that your blood pressure will come down coming off of the meloxicam so I don't want to much to build up in your system.  Please recheck with me early next week - as long as your blood pressure is normal, we can consider a trial of a different non-steroidal anti-inflammatory (such as aleve or ibuprofen) and closely watch your blood pressure while on it.  If you BP is not improving, please let me know immediately so we can take further steps looking for the etiology.    IF you received an x-ray today, you will receive an invoice from [facility]. Please contact [facility] at [phone] with questions or concerns regarding your invoice.   IF you received labwork today, you will receive an invoice from [employer]. Please contact [facility] at [phone] with questions or concerns regarding your invoice.   Our billing staff will not be able to assist you with questions regarding bills from these companies.  You will be contacted with the lab results as soon as they are available. The fastest way to get your results is to activate your My Chart account. Instructions are located on the last page of this paperwork. If you have not heard from [place] regarding the results in  2 weeks, please contact this office.      Managing Your High Blood Pressure Blood pressure is a measurement of how forceful your blood is pressing against the walls of the arteries. Arteries are muscular tubes within the circulatory system. Blood pressure does not stay the same. Blood pressure rises when you are active, excited, or nervous; and it lowers during sleep and relaxation. If the numbers measuring your blood pressure stay above normal most of the time, you are at risk for health problems. High blood pressure (hypertension) is a long-term (chronic) condition in which blood pressure is elevated. A blood pressure reading is recorded as two numbers, such as 120 over 80 (or 120/80). The first, higher number is called the systolic pressure. It is a measure of the pressure in your arteries as the heart beats. The second, lower number is called the diastolic pressure. It is a measure of the pressure in your arteries as the heart relaxes between beats.  Keeping your blood pressure in a normal range is important to your overall health and prevention of health problems, such as heart disease and stroke. When your blood pressure is uncontrolled, your heart has to work harder than normal. High blood pressure is a very common condition in adults because blood pressure tends to rise with age. Men and women are equally likely to have hypertension but at different times in life. Before age 32, men are more likely to  have hypertension. After 71 years of age, women are more likely to have it. Hypertension is especially common in African Americans. This condition often has no signs or symptoms. The cause of the condition is usually not known. Your caregiver can help you come up with a plan to keep your blood pressure in a normal, healthy range. BLOOD PRESSURE STAGES Blood pressure is classified into four stages: normal, prehypertension, stage 1, and stage 2. Your blood pressure reading will be used to determine what  type of treatment, if any, is necessary. Appropriate treatment options are tied to these four stages:  Normal  Systolic pressure (mm Hg): below 120.  Diastolic pressure (mm Hg): below 80. Prehypertension  Systolic pressure (mm Hg): 120 to 139.  Diastolic pressure (mm Hg): 80 to 89. Stage1  Systolic pressure (mm Hg): 140 to 159.  Diastolic pressure (mm Hg): 90 to 99. Stage2  Systolic pressure (mm Hg): 160 or above.  Diastolic pressure (mm Hg): 100 or above. RISKS RELATED TO HIGH BLOOD PRESSURE Managing your blood pressure is an important responsibility. Uncontrolled high blood pressure can lead to:  A heart attack.  A stroke.  A weakened blood vessel (aneurysm).  Heart failure.  Kidney damage.  Eye damage.  Metabolic syndrome.  Memory and concentration problems. HOW TO MANAGE YOUR BLOOD PRESSURE Blood pressure can be managed effectively with lifestyle changes and medicines (if needed). Your caregiver will help you come up with a plan to bring your blood pressure within a normal range. Your plan should include the following: Education  Read all information provided by your caregivers about how to control blood pressure.  Educate yourself on the latest guidelines and treatment recommendations. New research is always being done to further define the risks and treatments for high blood pressure. Lifestylechanges  Control your weight.  Avoid smoking.  Stay physically active.  Reduce the amount of salt in your diet.  Reduce stress.  Control any chronic conditions, such as high cholesterol or diabetes.  Reduce your alcohol intake. Medicines  Several medicines (antihypertensive medicines) are available, if needed, to bring blood pressure within a normal range. Communication  Review all the medicines you take with your caregiver because there may be side effects or interactions.  Talk with your caregiver about your diet, exercise habits, and other  lifestyle factors that may be contributing to high blood pressure.  See your caregiver regularly. Your caregiver can help you create and adjust your plan for managing high blood pressure. RECOMMENDATIONS FOR TREATMENT AND FOLLOW-UP  The following recommendations are based on current guidelines for managing high blood pressure in nonpregnant adults. Use these recommendations to identify the proper follow-up period or treatment option based on your blood pressure reading. You can discuss these options with your caregiver.  Systolic pressure of 123456 to [id number] or diastolic pressure of 80 to 89: Follow up with your caregiver as directed.  Systolic pressure of [id number] to 0000000 or diastolic pressure of 90 to 100: Follow up with your caregiver within 2 months.  Systolic pressure above 0000000 or diastolic pressure above [id number]: Follow up with your caregiver within 1 month.  Systolic pressure above [id number] or diastolic pressure above A999333: Consider antihypertensive therapy; follow up with your caregiver within 1 week.  Systolic pressure above A999333 or diastolic pressure above 123456: Begin antihypertensive therapy; follow up with your caregiver within 1 week.   This information is not intended to replace advice given to you by your health care provider. Make sure you discuss any  questions you have with your health care provider.   Document Released: 11/09/2011 Document Reviewed: 11/09/2011 Elsevier Interactive Patient Education [employer].

## 2015-06-26 ENCOUNTER — Telehealth: Payer: Self-pay

## 2015-06-26 LAB — HEPATITIS C ANTIBODY: HCV Ab: NEGATIVE

## 2015-06-26 NOTE — Telephone Encounter (Signed)
Patient states that the

## 2015-06-26 NOTE — Telephone Encounter (Signed)
Spoke with patient. Will take Aleve to start per PCP and then try Tylenol if no benefit. Given contact info to get disc of xrays-to [facility] medical records number.

## 2015-06-26 NOTE — Telephone Encounter (Signed)
Patient states that the meloxicam she was taking was making her BP very high. She has stopped taking it as of today. She didn't know if you wanted her to take anything else. Please advise.

## 2015-06-26 NOTE — Telephone Encounter (Signed)
Spoke with patient. Is going to try Aleve per recommendation of PCP and if no benefit will switch to Tylenol. Also trying to get copy of xrays, gave contact information for [facility] records to try to help.

## 2015-06-26 NOTE — Telephone Encounter (Signed)
I would do tylenol 500mg  3 times daily scheduled instead

## 2015-06-30 ENCOUNTER — Ambulatory Visit: Payer: Medicare Other | Admitting: Family Medicine

## 2015-07-07 ENCOUNTER — Encounter: Payer: Self-pay | Admitting: Family Medicine

## 2015-07-07 ENCOUNTER — Ambulatory Visit (INDEPENDENT_AMBULATORY_CARE_PROVIDER_SITE_OTHER): Payer: Medicare Other | Admitting: Family Medicine

## 2015-07-07 ENCOUNTER — Ambulatory Visit (INDEPENDENT_AMBULATORY_CARE_PROVIDER_SITE_OTHER): Payer: Medicare Other

## 2015-07-07 VITALS — BP 178/88 | HR 75 | Temp 97.7°F | Resp 18 | Ht 62.0 in | Wt 128.6 lb

## 2015-07-07 VITALS — BP 152/98 | HR 64 | Ht 62.0 in | Wt 129.0 lb

## 2015-07-07 DIAGNOSIS — R030 Elevated blood-pressure reading, without diagnosis of hypertension: Secondary | ICD-10-CM

## 2015-07-07 DIAGNOSIS — R0989 Other specified symptoms and signs involving the circulatory and respiratory systems: Secondary | ICD-10-CM | POA: Diagnosis not present

## 2015-07-07 DIAGNOSIS — I160 Hypertensive urgency: Secondary | ICD-10-CM

## 2015-07-07 DIAGNOSIS — M24851 Other specific joint derangements of right hip, not elsewhere classified: Secondary | ICD-10-CM

## 2015-07-07 LAB — BASIC METABOLIC PANEL
BUN: 17 mg/dL (ref 7–25)
CO2: 25 mmol/L (ref 20–31)
CREATININE: 0.93 mg/dL (ref 0.60–0.93)
Calcium: 9.8 mg/dL (ref 8.6–10.4)
Chloride: 103 mmol/L (ref 98–110)
GLUCOSE: 87 mg/dL (ref 65–99)
Potassium: 4.8 mmol/L (ref 3.5–5.3)
Sodium: 140 mmol/L (ref 135–146)

## 2015-07-07 LAB — CBC WITH DIFFERENTIAL/PLATELET
Basophils Absolute: 78 cells/uL (ref 0–200)
Basophils Relative: 1 %
Eosinophils Absolute: 390 cells/uL (ref 15–500)
Eosinophils Relative: 5 %
HEMATOCRIT: 44.8 % (ref 35.0–45.0)
Hemoglobin: 15.3 g/dL (ref 11.7–15.5)
LYMPHS PCT: 25 %
Lymphs Abs: 1950 cells/uL (ref 850–3900)
MCH: 31.8 pg (ref 27.0–33.0)
MCHC: 34.2 g/dL (ref 32.0–36.0)
MCV: 93.1 fL (ref 80.0–100.0)
MONO ABS: 624 {cells}/uL (ref 200–950)
MPV: 10.5 fL (ref 7.5–12.5)
Monocytes Relative: 8 %
NEUTROS PCT: 61 %
Neutro Abs: 4758 cells/uL (ref 1500–7800)
Platelets: 240 10*3/uL (ref 140–400)
RBC: 4.81 MIL/uL (ref 3.80–5.10)
RDW: 13.2 % (ref 11.0–15.0)
WBC: 7.8 10*3/uL (ref 3.8–10.8)

## 2015-07-07 LAB — POCT SEDIMENTATION RATE: POCT SED RATE: 21 mm/h (ref 0–22)

## 2015-07-07 MED ORDER — AMLODIPINE BESYLATE 5 MG PO TABS: 5.0000 mg | ORAL_TABLET | Freq: Every day | ORAL | Status: AC

## 2015-07-07 NOTE — Assessment & Plan Note (Signed)
Patient does have what appears to be more of a tendinitis likely contributing to this. I think pain differential includes tensor fascia lata, proximal quadricep, Raymond hip flexor pathology. Patient will be sent to formal physical therapy. Anterior impingement of the hip is also within the differential. Patient though declined any type of diagnostic injection. Patient unable to do anti-inflammatories and we did discuss possible over-the-counter medicines and what to watch for for any type of side effect. Patient will follow-up with primary care provider for her hypertension. Patient and will follow-up with me again in 6 weeks. If continuing have pain we'll consider either injection burst possible advance imaging. When overall x-rays with patient and answered multiple questions. Spent  25 minutes with patient face-to-face and had greater than 50% of counseling including as described above in assessment and plan.

## 2015-07-07 NOTE — Progress Notes (Signed)
Pre visit review using our clinic review tool, if applicable. No additional management support is needed unless otherwise documented below in the visit note. 

## 2015-07-07 NOTE — Patient Instructions (Addendum)
IF you received an x-ray today, you will receive an invoice from [facility]. Please contact [facility] at [phone] with questions or concerns regarding your invoice.   IF you received labwork today, you will receive an invoice from [employer]. Please contact [facility] at [phone] with questions or concerns regarding your invoice.   Our billing staff will not be able to assist you with questions regarding bills from these companies.  You will be contacted with the lab results as soon as they are available. The fastest way to get your results is to activate your My Chart account. Instructions are located on the last page of this paperwork. If you have not heard from [place] regarding the results in 2 weeks, please contact this office.    Continue the losartan - we may want to change this to the more effective valsartan in the future if we need a little more bp control. For now add in the amlodipine along with the losartan.  Continue the as needed clonidine.  Check your bp 3-4x/d at most and never check it within 3 hours of taking a medicine as it needs time to work.  Try to find ways to decrease stress, pain, sleep well, eat well, etc so that we can remove the life stressors as a cause of spiking up your BP.  Please recheck with me in 10-14d to review bp log and decide on further evaluation or medication adjustments.   If you are having insomnia, try to take 2 alprazolam to help sleep over the next week or two so we can ensure that lack of sleep isn't contributing to your blood pressure problems.  Managing Your High Blood Pressure Blood pressure is a measurement of how forceful your blood is pressing against the walls of the arteries. Arteries are muscular tubes within the circulatory system. Blood pressure does not stay the same. Blood pressure rises when you are active, excited, or nervous; and it lowers during sleep and relaxation. If the  numbers measuring your blood pressure stay above normal most of the time, you are at risk for health problems. High blood pressure (hypertension) is a long-term (chronic) condition in which blood pressure is elevated. A blood pressure reading is recorded as two numbers, such as 120 over 80 (or 120/80). The first, higher number is called the systolic pressure. It is a measure of the pressure in your arteries as the heart beats. The second, lower number is called the diastolic pressure. It is a measure of the pressure in your arteries as the heart relaxes between beats.  Keeping your blood pressure in a normal range is important to your overall health and prevention of health problems, such as heart disease and stroke. When your blood pressure is uncontrolled, your heart has to work harder than normal. High blood pressure is a very common condition in adults because blood pressure tends to rise with age. Men and women are equally likely to have hypertension but at different times in life. Before age 28, men are more likely to have hypertension. After 71 years of age, women are more likely to have it. Hypertension is especially common in African Americans. This condition often has no signs or symptoms. The cause of the condition is usually not known. Your caregiver can help you come up with a plan to keep your blood pressure in a normal, healthy range. BLOOD PRESSURE STAGES Blood pressure is classified into four stages: normal, prehypertension, stage 1, and stage  2. Your blood pressure reading will be used to determine what type of treatment, if any, is necessary. Appropriate treatment options are tied to these four stages:  Normal  Systolic pressure (mm Hg): below 120.  Diastolic pressure (mm Hg): below 80. Prehypertension  Systolic pressure (mm Hg): 120 to 139.  Diastolic pressure (mm Hg): 80 to 89. Stage1  Systolic pressure (mm Hg): 140 to 159.  Diastolic pressure (mm Hg): 90 to  99. Stage2  Systolic pressure (mm Hg): 160 or above.  Diastolic pressure (mm Hg): 100 or above. RISKS RELATED TO HIGH BLOOD PRESSURE Managing your blood pressure is an important responsibility. Uncontrolled high blood pressure can lead to:  A heart attack.  A stroke.  A weakened blood vessel (aneurysm).  Heart failure.  Kidney damage.  Eye damage.  Metabolic syndrome.  Memory and concentration problems. HOW TO MANAGE YOUR BLOOD PRESSURE Blood pressure can be managed effectively with lifestyle changes and medicines (if needed). Your caregiver will help you come up with a plan to bring your blood pressure within a normal range. Your plan should include the following: Education  Read all information provided by your caregivers about how to control blood pressure.  Educate yourself on the latest guidelines and treatment recommendations. New research is always being done to further define the risks and treatments for high blood pressure. Lifestylechanges  Control your weight.  Avoid smoking.  Stay physically active.  Reduce the amount of salt in your diet.  Reduce stress.  Control any chronic conditions, such as high cholesterol or diabetes.  Reduce your alcohol intake. Medicines  Several medicines (antihypertensive medicines) are available, if needed, to bring blood pressure within a normal range. Communication  Review all the medicines you take with your caregiver because there may be side effects or interactions.  Talk with your caregiver about your diet, exercise habits, and other lifestyle factors that may be contributing to high blood pressure.  See your caregiver regularly. Your caregiver can help you create and adjust your plan for managing high blood pressure. RECOMMENDATIONS FOR TREATMENT AND FOLLOW-UP  The following recommendations are based on current guidelines for managing high blood pressure in nonpregnant adults. Use these recommendations to  identify the proper follow-up period or treatment option based on your blood pressure reading. You can discuss these options with your caregiver.  Systolic pressure of 123456 to XX123456 or diastolic pressure of 80 to 89: Follow up with your caregiver as directed.  Systolic pressure of XX123456 to 0000000 or diastolic pressure of 90 to 100: Follow up with your caregiver within 2 months.  Systolic pressure above 0000000 or diastolic pressure above 123XX123: Follow up with your caregiver within 1 month.  Systolic pressure above 99991111 or diastolic pressure above A999333: Consider antihypertensive therapy; follow up with your caregiver within 1 week.  Systolic pressure above A999333 or diastolic pressure above 123456: Begin antihypertensive therapy; follow up with your caregiver within 1 week.   This information is not intended to replace advice given to you by your health care provider. Make sure you discuss any questions you have with your health care provider.   Document Released: 11/09/2011 Document Reviewed: 11/09/2011 Elsevier Interactive Patient Education [employer].

## 2015-07-07 NOTE — Patient Instructions (Addendum)
Great to see you  Erika Ramsey is your friend  Stay active.  Lets get you into PT at this moment.  Avoid a lot of stairs if you can or any deep squats.  See me again in 6 weeks.  If not better we will need to consider MRI or injection.

## 2015-07-07 NOTE — Progress Notes (Signed)
By signing my name below I, Tereasa Coop, attest that this documentation has been prepared under the direction and in the presence of Delman Cheadle, MD. Electonically Signed. Tereasa Coop, Scribe 07/07/2015 at 4:18 PM   Subjective:    Patient ID: Erika Ramsey, female    DOB: 31-Oct-1945, 70 y.o.   MRN: [id number]  Chief Complaint  Patient presents with  . Blood Pressure Check    BP has been elevated for the past 3 weeks    HPI Erika Ramsey is a 70 y.o. female who presents to the [facility] for BP reevaluation. Pt was seen by Dr Brigitte Pulse for full physical 2 weeks prior to today. Pt had been monitoring BP outside of office and reports that her systolic pressure has been 117-131. Her BP cuff was checked vs ours and was accurate. Pt's BP was 201/75 during that visit and was 201/75, pt given 0.1 mg clonidine in office and instructed to take 0.1mg  clonidine if her systolic pressure rose above 170. Pt reports taking clonidine 3 times over the past 3 weeks to control her BP.  Pt's BP 140s-170s for several days after last visit. Approximately 5 days after stopping meloxicam, BP improved to 120s-140s, then 3 days prior to this visit BP increased to 160s-180s. Pt's diastolic pressure has remained below 90 throughout.  Pt's BP rose significantly after taking aleve within the past 3 weeks. Pt then stopped taking aleve, meloxicam, and ibuprofen and still had an episode of elevated BP last night.    Pt also discontinued taking her yesterday gabapentin due to concerns that it could be raising her BP.  Hip pain is improving and is mild and tolerable.  Pt denies any HA, lower extremity edema, SOB, or SOB while laying flat, or seeing floaters or having any change in vision.   Pt reports anxiety of elevated BP. Pt also having trouble sleeping last night due to discomfort of hip and anxiety over BP. Pt denies increasing her alprazolam to help her sleep or reduce anxiety.  Pt denies ever  having adverse side effects from any BP medication.   Pt drinks minimal caffeine.   Pt reports normal urine output and color and denies dysuria.  Pt denies cough or congestion. Pt had recent bronchitis which has resolved.      Patient Active Problem List   Diagnosis Date Noted  . 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 Chronic low back pain 06/18/2015  . History of cervical dysplasia 08/07/2012  . Osteopenia   . Atrophic vaginitis   . Hypertension    Current Outpatient Prescriptions on File Prior to Visit  Medication Sig Dispense Refill  . ALPRAZolam (XANAX) 0.25 MG tablet Take 1 tablet (0.25 mg total) by mouth daily. 90 tablet 1  . aspirin 81 MG tablet Take 81 mg by mouth daily.    . Calcium Carbonate-Vitamin D (CALCIUM + D PO) Take by mouth.      . cloNIDine (CATAPRES) 0.1 MG tablet Take 1 tablet (0.1 mg total) by mouth 2 (two) times daily. As needed for blood pressure > 123XX123 systolic 30 tablet 0  . Fish Oil-Cholecalciferol (FISH OIL + D3) 1200-1000 MG-UNIT CAPS Take by mouth.      . losartan (COZAAR) 100 MG tablet Take 1 tablet (100 mg total) by mouth daily. 90 tablet 3  . Multiple Vitamin (MULTIVITAMIN) capsule Take 1 capsule by mouth daily.      Marland Kitchen gabapentin (NEURONTIN) 100 MG capsule Take 2 capsules (  200 mg total) by mouth at bedtime. (Patient not taking: Reported on 07/07/2015) 60 capsule 3   No current facility-administered medications on file prior to visit.   Allergies  Allergen Reactions  . Pollen Extract    Depression screen [facility] 2/9 07/07/2015 06/25/2015 04/08/2015 12/25/2014  Decreased Interest 0 0 0 0  Down, Depressed, Hopeless 0 0 0 0  PHQ - 2 Score 0 0 0 0      Review of Systems  Constitutional: Negative for fever.  HENT: Negative for congestion.   Eyes: Negative for visual disturbance.  Respiratory: Negative for cough.   Cardiovascular: Negative for chest pain and leg swelling.  Gastrointestinal: Negative for abdominal pain.  Genitourinary: Negative for  dysuria, frequency and decreased urine volume.  Musculoskeletal: Positive for arthralgias (hip pain improving). Back pain: improving.  Skin: Negative for rash.  Neurological: Negative for headaches.  Psychiatric/Behavioral: Positive for sleep disturbance (from anxiety and hip pain). Negative for agitation. The patient is nervous/anxious (over elevated BP).        Objective:  BP 178/88 mmHg  Pulse 75  Temp(Src) 97.7 F (36.5 C) (Oral)  Resp 18  Ht 5\' 2"  (1.575 m)  Wt 128 lb 9.6 oz (58.333 kg)  BMI 23.52 kg/m2  SpO2 99%  Physical Exam  Constitutional: She is oriented to person, place, and time. She appears well-developed and well-nourished. No distress.  HENT:  Head: Normocephalic and atraumatic.  Eyes: Conjunctivae are normal. Pupils are equal, round, and reactive to light.  Neck: Neck supple. No thyroid mass and no thyromegaly present.  Cardiovascular: Normal rate, regular rhythm, S1 normal, S2 normal and normal heart sounds.  Exam reveals no gallop.   No murmur heard. Pulses:      Posterior tibial pulses are 2+ on the right side, and 2+ on the left side.  Negative for bipedal edema.  Pulmonary/Chest: Effort normal. She has no decreased breath sounds. She has no wheezes. She has no rhonchi. She has rales (fine/mild) in the right lower field.  Abdominal: There is no CVA tenderness.  Musculoskeletal: Normal range of motion.  Pt has no swelling in her legs bilat.   Lymphadenopathy:    She has no cervical adenopathy.  Neurological: She is alert and oriented to person, place, and time.  Skin: Skin is warm and dry.  Pt has good capillary refill.   Psychiatric: She has a normal mood and affect. Her behavior is normal.  Nursing note and vitals reviewed.  Dg Chest 2 View  07/07/2015  CLINICAL DATA:  Inspiratory rales in the right lower lobe, new onset labile blood pressure, former smoker. EXAM: CHEST  2 VIEW COMPARISON:  Report of a chest x-ray dated August 21, 1999. FINDINGS: The left  hemidiaphragm appears mildly elevated which may be in part due to gaseous distention of the splenic flexure of the colon under the hemidiaphragm. The lungs are clear. There is no pleural effusion or pneumothorax. The heart and pulmonary vascularity are normal. The bony thorax exhibits no acute abnormality. IMPRESSION: There is no acute cardiopulmonary abnormality. There is mild elevation of the left hemidiaphragm of uncertain duration. Electronically Signed   By: David  Martinique M.D.   On: 07/07/2015 16:07   Dg Lumbar Spine Complete  06/18/2015  CLINICAL DATA:  Lumbago EXAM: LUMBAR SPINE - COMPLETE 4+ VIEW COMPARISON:  None. FINDINGS: Frontal, lateral, spot lumbosacral lateral, and bilateral oblique views were obtained. There are 5 non-rib-bearing lumbar type vertebral bodies. There is age uncertain mild anterior wedging of the  T12 vertebral body. There is no other fracture. There is minimal anterolisthesis of L4 on L5. No other spondylolisthesis is evident. There is mild disc space narrowing at all levels in the lumbar region. There is facet osteoarthritic change at L4-5 and L5-S1 bilaterally. IMPRESSION: Age uncertain anterior wedging of the T12 vertebral body. No other fracture evident slight spondylolisthesis at L4-5, likely due to underlying spondylosis. There is osteoarthritic change at several levels, most notably at L4-5 and L5-S1. Electronically Signed   By: Lowella Grip III M.D.   On: 06/18/2015 13:31   Dg Hip Unilat W Or W/o Pelvis 2-3 Views Right  06/18/2015  CLINICAL DATA:  Right hip pain EXAM: DG HIP   2-3V RIGHT COMPARISON:  None. FINDINGS: Frontal and lateral views were obtained. There is no fracture or dislocation. There is mild narrowing of the right hip joint. No erosive change. IMPRESSION: Mild narrowing right hip joint.  No fracture or dislocation. Electronically Signed   By: Lowella Grip III M.D.   On: 06/18/2015 13:32          Assessment & Plan:   1. Hypertensive urgency    2. Chest rales   3. Labile blood pressure   Continue losartan - may want to cons changing to valsartan if BP still not at goal. Check BP at home 3-4x/d max, do not check within 3 hrs of taking med as it needs time to take effect. Start amlodipine. Cont prn clonidine. Try to focus on stress relief and lifestyle change to help lower BP. Recheck in 10-14d.  Has been using alprazolam prn anxiety during day - ok to try doubling up on the dose qhs for insomnia. Reg sleep should help lower BP.   Orders Placed This Encounter  Procedures  . DG Chest 2 View    Standing Status: Future     Number of Occurrences: 1     Standing Expiration Date: 07/06/2016    Order Specific Question:  Reason for Exam (SYMPTOM  OR DIAGNOSIS REQUIRED)    Answer:  RLL insp rales, new onset labile BP    Order Specific Question:  Preferred imaging location?    Answer:  External  . CBC with Differential/Platelet  . Basic metabolic panel    Order Specific Question:  Has the patient fasted?    Answer:  No  . Brain natriuretic peptide  . POCT SEDIMENTATION RATE    Meds ordered this encounter  Medications  . amLODipine (NORVASC) 5 MG tablet    Sig: Take 1 tablet (5 mg total) by mouth daily.    Dispense:  30 tablet    Refill:  0    I personally performed the services described in this documentation, which was scribed in my presence. The recorded information has been reviewed and considered, and addended by me as needed.   Delman Cheadle, M.D.  [facility] [address] [place], [place] [address] [phone] phone [phone] fax  07/29/2015 9:04 AM

## 2015-07-07 NOTE — Progress Notes (Signed)
[facility] [address] [place], [place] [address] Phone: [phone] Subjective:    I'm seeing this patient by the request  of:  RICHTER,KAREN L., MD   CC: right hip pain Follow-up  [id number] Erika Ramsey is a 69 y.o. female coming in with complaint of right hip pain. Patient was seen previously and diagnosed with mild arthritic changes of the hip as well as a snapping hip syndrome. Given home exercises, icing protocol, we discussed over-the-counter medications and what activities to avoid. Patient states She has not made any significant improvement. Was unable to take anti-inflammatories because it did cause her to increase her blood pressure. Patient is following up with another provider for this. Does not want to talk to me about her blood pressure today. Patient continues to have the pain of the hip. States that certain movements seem to continue to give her that daily where it may give out. Still seems to be mostly on the anterior lateral aspect of the hip. Minimal groin pain with it. The pain was going down the leg seems to be improved somewhat. Patient was taking the gabapentin but discontinued this as well secondary to the hypertension. Does not know what was causing it specifically.  Patient did have x-rays at last follow-up. 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 Osteopenia 02/2012    T score -1.6 FRAX 9.6%/ 1.3%  . Atrophic vaginitis   . Decreased libido   . Hypertension   . 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 Breast cyst    Past Surgical History  Procedure Laterality Date  . Pelvic laparoscopy      Diag Lap  . Hand surgery    . Colposcopy   1977    CIN 3   Social History   Social History  . Marital Status: Single    Spouse Name: N/A  . Number of Children: N/A  . Years of Education: N/A   Social History Main Topics  . Smoking status: Former Research scientist (life sciences)  . Smokeless tobacco: None  . Alcohol Use: 3.6 oz/week    6 Standard drinks or equivalent per week  . Drug Use: No  . Sexual Activity: Yes    Birth Control/ Protection: Post-menopausal     Comment: intercourse age 72, sexual partners less than 5   Other Topics Concern  . None   Social History Narrative   Allergies  Allergen Reactions  . Pollen Extract    Family History  Problem Relation Age of Onset  . Hypertension Mother   . Heart disease Mother   . Cancer Father     Colon cancer  . Heart disease Father   . Diabetes Maternal Grandmother   . Cancer Paternal Grandfather     Colon cancer  . Breast cancer Maternal Aunt     Great aunt 73's    Past medical history, social, surgical and family history all reviewed in electronic medical record.  No pertanent information unless stated regarding to the chief complaint.   Review of Systems: No headache, visual changes, nausea, vomiting, diarrhea, constipation, dizziness, abdominal pain, skin rash, fevers, chills, night sweats, weight loss, swollen lymph nodes, body  aches, joint swelling, muscle aches, chest pain, shortness of breath, mood changes.   Objective Blood pressure 152/98, pulse 64, height 5\' 2"  (1.575 m), weight 129 lb (58.514 kg), SpO2 97 %.  General: No apparent distress alert and oriented x3 mood and affect normal, dressed appropriately.  HEENT: Pupils equal, extraocular movements intact  Respiratory: Patient's speak in full sentences and does not appear short of breath  Cardiovascular: No lower extremity edema, non tender, no erythema  Skin: Warm dry intact with no signs of infection or rash on extremities or on axial skeleton.  Abdomen: Soft nontender  Neuro: Cranial nerves II through XII are intact,  neurovascularly intact in all extremities with 2+ DTRs and 2+ pulses.  Lymph: No lymphadenopathy of posterior or anterior cervical chain or axillae bilaterally.  Gait normal with good balance and coordination.  MSK:  Non tender with full range of motion and good stability and symmetric strength and tone of shoulders, elbows, wrist, , knee and ankles bilaterally.  [id number] ROM IR: 69 Deg with mild to moderate pain worse than previous exam, ER: 35 Deg, Flexion: 100 Deg, Extension: 80 Deg, Abduction: 45 Deg, Adduction: 15 Deg Strength IR: 5/5, ER: 5/5, Flexion: 5/5, Extension: 5/5, Abduction: 5/5, Adduction: 45 Pelvic alignment unremarkable to inspection and palpation. Standing hip rotation and gait without trendelenburg sign / unsteadiness. Greater trochanter without tenderness to palpation. More pain over the tensor fascia lata proximal quadricep as well as somewhat over the groin area itself Mild discomfort of the sacroiliac joints bilaterally as well as the paraspinal musculature on the right side of the lumbar spine. No spinous process tenderness. Negative straight leg test. Contralateral hip is painful   Impression and Recommendations:     This case required medical decision making of moderate complexity.      Note: This dictation was prepared with Dragon dictation along with smaller phrase technology. Any transcriptional errors that result from this process are unintentional.

## 2015-07-08 LAB — BRAIN NATRIURETIC PEPTIDE: BRAIN NATRIURETIC PEPTIDE: 22 pg/mL (ref <100)

## 2015-07-13 NOTE — Addendum Note (Signed)
Addended by: Delman Cheadle on: 07/13/2015 09:45 AM   Modules accepted: Miquel Dunn

## 2015-07-16 ENCOUNTER — Encounter: Payer: Self-pay | Admitting: Family Medicine

## 2015-08-13 ENCOUNTER — Other Ambulatory Visit: Payer: Self-pay | Admitting: Family Medicine

## 2015-08-13 DIAGNOSIS — E079 Disorder of thyroid, unspecified: Secondary | ICD-10-CM

## 2015-08-18 ENCOUNTER — Other Ambulatory Visit: Payer: Medicare Other

## 2015-08-18 ENCOUNTER — Ambulatory Visit: Payer: Medicare Other | Admitting: Family Medicine

## 2015-08-19 ENCOUNTER — Ambulatory Visit
Admission: RE | Admit: 2015-08-19 | Discharge: 2015-08-19 | Disposition: A | Discharge status: Home / Self Care | Payer: Medicare Other | Source: Ambulatory Visit | Attending: Family Medicine | Admitting: Family Medicine

## 2015-08-19 DIAGNOSIS — E079 Disorder of thyroid, unspecified: Secondary | ICD-10-CM

## 2016-04-04 ENCOUNTER — Encounter: Payer: Self-pay | Admitting: Gynecology

## 2016-04-04 ENCOUNTER — Ambulatory Visit (INDEPENDENT_AMBULATORY_CARE_PROVIDER_SITE_OTHER): Payer: Medicare Other | Admitting: Gynecology

## 2016-04-04 VITALS — BP 128/80 | Ht 62.0 in | Wt 131.0 lb

## 2016-04-04 DIAGNOSIS — M8580 Other specified disorders of bone density and structure, unspecified site: Secondary | ICD-10-CM | POA: Diagnosis not present

## 2016-04-04 DIAGNOSIS — Z01411 Encounter for gynecological examination (general) (routine) with abnormal findings: Secondary | ICD-10-CM

## 2016-04-04 DIAGNOSIS — Z87410 Personal history of cervical dysplasia: Secondary | ICD-10-CM | POA: Diagnosis not present

## 2016-04-04 NOTE — Addendum Note (Signed)
Addended by: Burnett Kanaris on: 04/04/2016 03:46 PM   Modules accepted: Orders

## 2016-04-04 NOTE — Progress Notes (Signed)
Erika Ramsey 1945/08/03 [id number]   History:    71 y.o.  for annual gyn exam with no complaints. Patient is scheduled to undergo right hip replacement within the next 2 weeks. Patient no longer on hormone replacement therapy. Patient denies any vaginal bleeding. 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 Both her father and grandfather had colon cancer. Her last bone density study was in 2016 and the lowest T score was at the right femoral neck with a value of -1.3 normal Frax analysis. When compared with 2014 there was statistically significant improvement on the BMD in the AP spine and no change in the hips. Her PCP has been drawn her blood work. And her vaccines are up-to-date.   Past medical history,surgical history, family history and social history were all reviewed and documented in the EPIC chart.  Gynecologic History No LMP recorded. Patient is postmenopausal. Contraception: post menopausal status Last Pap: 2016. Results were: normal Last mammogram: 2017. Results were: Normal had three-dimensional mammogram  Obstetric History OB History  Gravida Para Term Preterm AB Living  0 0 0 0 0 0  SAB TAB Ectopic Multiple Live Births  0 0 0 0 0         ROS: A ROS was performed and pertinent positives and negatives are included in the history.  GENERAL: No fevers or chills. HEENT: No change in vision, no earache, sore throat or sinus congestion. NECK: No pain or stiffness. CARDIOVASCULAR: No chest pain or pressure. No palpitations. PULMONARY: No shortness of breath, cough or wheeze. GASTROINTESTINAL: No abdominal pain, nausea, vomiting or diarrhea, melena or bright red blood per rectum. GENITOURINARY: No urinary frequency, urgency, hesitancy or dysuria. MUSCULOSKELETAL: No joint or muscle pain, no back pain, no recent trauma. DERMATOLOGIC: No rash, no itching, no  lesions. ENDOCRINE: No polyuria, polydipsia, no heat or cold intolerance. No recent change in weight. HEMATOLOGICAL: No anemia or easy bruising or bleeding. NEUROLOGIC: No headache, seizures, numbness, tingling or weakness. PSYCHIATRIC: No depression, no loss of interest in normal activity or change in sleep pattern.     Exam: chaperone present  BP 128/80   Ht 5\' 2"  (1.575 m)   Wt 131 lb (59.4 kg)   BMI 23.96 kg/m   Body mass index is 23.96 kg/m.  General appearance : Well developed well nourished female. No acute distress HEENT: Eyes: no retinal hemorrhage or exudates,  Neck supple, trachea midline, no carotid bruits, no thyroidmegaly Lungs: Clear to auscultation, no rhonchi or wheezes, or rib retractions  Heart: Regular rate and rhythm, no murmurs or gallops Breast:Examined in sitting and supine position were symmetrical in appearance, no palpable masses or tenderness,  no skin retraction, no nipple inversion, no nipple discharge, no skin discoloration, no axillary or supraclavicular lymphadenopathy Abdomen: no palpable masses or tenderness, no rebound or guarding Extremities: no edema or skin discoloration or tenderness  Pelvic:  Bartholin, Urethra, Skene Glands: Within normal limits             Vagina: No gross lesions or discharge, atrophic changes  Cervix: No gross lesions or discharge  Uterus  axial, normal size, shape and consistency, non-tender and mobile  Adnexa  Without masses or tenderness  Anus and perineum  normal   Rectovaginal  normal sphincter tone without palpated masses or tenderness             Hemoccult PCP  provides     Assessment/Plan:  71 y.o. female for annual exam doing well from a GYN standpoint. Patient with osteopenia as described above. Since she's having a right hip replacement in March I recommended she follow-up with her bone density study next year instead of this year. She was encouraged to continue on her calcium and vitamin D.Because of past  history of CIN-3 Pap smear was done today. PCP has been doing her blood work and her vaccines are up-to-date.   Terrance Mass MD, 3:06 PM 04/04/2016

## 2016-04-05 LAB — PAP IG W/ RFLX HPV ASCU

## 2016-04-15 ENCOUNTER — Ambulatory Visit: Payer: Self-pay | Admitting: Orthopedic Surgery

## 2016-04-15 ENCOUNTER — Other Ambulatory Visit: Payer: Self-pay | Admitting: Gynecology

## 2016-04-15 DIAGNOSIS — Z1231 Encounter for screening mammogram for malignant neoplasm of breast: Secondary | ICD-10-CM

## 2016-04-28 ENCOUNTER — Encounter (HOSPITAL_COMMUNITY): Payer: Self-pay

## 2016-04-28 ENCOUNTER — Other Ambulatory Visit (HOSPITAL_COMMUNITY): Payer: Self-pay | Admitting: Emergency Medicine

## 2016-04-28 ENCOUNTER — Encounter (HOSPITAL_COMMUNITY)
Admission: RE | Admit: 2016-04-28 | Discharge: 2016-04-28 | Disposition: A | Discharge status: Home / Self Care | Payer: Medicare Other | Source: Ambulatory Visit | Attending: Orthopedic Surgery | Admitting: Orthopedic Surgery

## 2016-04-28 DIAGNOSIS — M1611 Unilateral primary osteoarthritis, right hip: Secondary | ICD-10-CM | POA: Diagnosis not present

## 2016-04-28 DIAGNOSIS — Z01818 Encounter for other preprocedural examination: Secondary | ICD-10-CM | POA: Insufficient documentation

## 2016-04-28 LAB — APTT: APTT: 34 s (ref 24–36)

## 2016-04-28 LAB — COMPREHENSIVE METABOLIC PANEL
ALBUMIN: 4 g/dL (ref 3.5–5.0)
ALT: 19 U/L (ref 14–54)
ANION GAP: 7 (ref 5–15)
AST: 25 U/L (ref 15–41)
Alkaline Phosphatase: 52 U/L (ref 38–126)
BUN: 19 mg/dL (ref 6–20)
CHLORIDE: 105 mmol/L (ref 101–111)
CO2: 27 mmol/L (ref 22–32)
Calcium: 9.5 mg/dL (ref 8.9–10.3)
Creatinine, Ser: 0.91 mg/dL (ref 0.44–1.00)
GFR calc non Af Amer: >60 mL/min (ref >60)
GLUCOSE: 95 mg/dL (ref 65–99)
Potassium: 4.3 mmol/L (ref 3.5–5.1)
SODIUM: 139 mmol/L (ref 135–145)
Total Bilirubin: 0.7 mg/dL (ref 0.3–1.2)
Total Protein: 7 g/dL (ref 6.5–8.1)

## 2016-04-28 LAB — PROTIME-INR
INR: 0.93
Prothrombin Time: 12.5 seconds (ref 11.4–15.2)

## 2016-04-28 LAB — CBC
HCT: 41.1 % (ref 36.0–46.0)
Hemoglobin: 14.2 g/dL (ref 12.0–15.0)
MCH: 31.9 pg (ref 26.0–34.0)
MCHC: 34.5 g/dL (ref 30.0–36.0)
MCV: 92.4 fL (ref 78.0–100.0)
PLATELETS: 233 10*3/uL (ref 150–400)
RBC: 4.45 MIL/uL (ref 3.87–5.11)
RDW: 12.3 % (ref 11.5–15.5)
WBC: 8.6 10*3/uL (ref 4.0–10.5)

## 2016-04-28 LAB — ABO/RH: ABO/RH(D): A NEG

## 2016-04-28 LAB — SURGICAL PCR SCREEN
MRSA, PCR: NEGATIVE
Staphylococcus aureus: NEGATIVE

## 2016-04-28 NOTE — Patient Instructions (Addendum)
Erika Ramsey  04/28/2016   Your procedure is scheduled on: 05-04-16  Report to [facility]  Entrance take [facility]  elevators to 3rd floor to  [place] at 6AM.  Call this number if you have problems the morning of surgery [phone]   Remember: ONLY 1 PERSON MAY GO WITH YOU TO SHORT STAY TO GET  READY MORNING OF [place].  Do not eat food or drink liquids :After Midnight.     Take these medicines the morning of surgery with A SIP OF WATER: amlodipine(norvasc), alprazolam(Xanax) as needed, clonidine(catapres) as needed                                 You may not have any metal on your body including hair pins and              piercings  Do not wear jewelry, make-up, lotions, powders or perfumes, deodorant             Do not wear nail polish.  Do not shave  48 hours prior to surgery.              Men may shave face and neck.    Do not bring valuables to the hospital. [place].  Contacts, dentures or bridgework may not be worn into surgery.  Leave suitcase in the car. After surgery it may be brought to your room.               Please read over the following fact sheets you were given: _____________________________________________________________________             [facility] - Preparing for Surgery Before surgery, you can play an important role.  Because skin is not sterile, your skin needs to be as free of germs as possible.  You can reduce the number of germs on your skin by washing with CHG (chlorahexidine gluconate) soap before surgery.  CHG is an antiseptic cleaner which kills germs and bonds with the skin to continue killing germs even after washing. Please DO NOT use if you have an allergy to CHG or antibacterial soaps.  If your skin becomes reddened/irritated stop using the CHG and inform your nurse when you arrive at Short Stay. Do not shave (including legs and underarms) for at least  48 hours prior to the first CHG shower.  You may shave your face/neck. Please follow these instructions carefully:  1.  Shower with CHG Soap the night before surgery and the  morning of Surgery.  2.  If you choose to wash your hair, wash your hair first as usual with your  normal  shampoo.  3.  After you shampoo, rinse your hair and body thoroughly to remove the  shampoo.                           4.  Use CHG as you would any other liquid soap.  You can apply chg directly  to the skin and wash                       Gently with a scrungie or clean washcloth.  5.  Apply the CHG Soap to your body ONLY FROM THE NECK DOWN.   Do not use on face/ open                           Wound or open sores. Avoid contact with eyes, ears mouth and genitals (private parts).                       Wash face,  Genitals (private parts) with your normal soap.             6.  Wash thoroughly, paying special attention to the area where your surgery  will be performed.  7.  Thoroughly rinse your body with warm water from the neck down.  8.  DO NOT shower/wash with your normal soap after using and rinsing off  the CHG Soap.                9.  Pat yourself dry with a clean towel.            10.  Wear clean pajamas.            11.  Place clean sheets on your bed the night of your first shower and do not  sleep with pets. Day of Surgery : Do not apply any lotions/deodorants the morning of surgery.  Please wear clean clothes to the hospital/surgery center.  FAILURE TO FOLLOW THESE INSTRUCTIONS MAY RESULT IN THE CANCELLATION OF YOUR SURGERY PATIENT SIGNATURE_________________________________  NURSE SIGNATURE__________________________________  ________________________________________________________________________   Erika Ramsey  An incentive spirometer is a tool that can help keep your lungs clear and active. This tool measures how well you are filling your lungs with each breath. Taking long deep breaths may  help reverse or decrease the chance of developing breathing (pulmonary) problems (especially infection) following:  A long period of time when you are unable to move or be active. BEFORE THE PROCEDURE   If the spirometer includes an indicator to show your best effort, your nurse or respiratory therapist will set it to a desired goal.  If possible, sit up straight or lean slightly forward. Try not to slouch.  Hold the incentive spirometer in an upright position. INSTRUCTIONS FOR USE  1. Sit on the edge of your bed if possible, or sit up as far as you can in bed or on a chair. 2. Hold the incentive spirometer in an upright position. 3. Breathe out normally. 4. Place the mouthpiece in your mouth and seal your lips tightly around it. 5. Breathe in slowly and as deeply as possible, raising the piston or the ball toward the top of the column. 6. Hold your breath for 3-5 seconds or for as long as possible. Allow the piston or ball to fall to the bottom of the column. 7. Remove the mouthpiece from your mouth and breathe out normally. 8. Rest for a few seconds and repeat Steps 1 through 7 at least 10 times every 1-2 hours when you are awake. Take your time and take a few normal breaths between deep breaths. 9. The spirometer may include an indicator to show your best effort. Use the indicator as a goal to work toward during each repetition. 10. After each set of 10 deep breaths, practice coughing to be sure your lungs are clear. If you have an incision (the cut made at the time of surgery), support your incision when coughing by placing a pillow or  rolled up towels firmly against it. Once you are able to get out of bed, walk around indoors and cough well. You may stop using the incentive spirometer when instructed by your caregiver.  RISKS AND COMPLICATIONS  Take your time so you do not get dizzy or light-headed.  If you are in pain, you may need to take or ask for pain medication before doing  incentive spirometry. It is harder to take a deep breath if you are having pain. AFTER USE  Rest and breathe slowly and easily.  It can be helpful to keep track of a log of your progress. Your caregiver can provide you with a simple table to help with this. If you are using the spirometer at home, follow these instructions: [place] IF:   You are having difficultly using the spirometer.  You have trouble using the spirometer as often as instructed.  Your pain medication is not giving enough relief while using the spirometer.  You develop fever of 100.5 F (38.1 C) or higher. SEEK IMMEDIATE MEDICAL CARE IF:   You cough up bloody sputum that had not been present before.  You develop fever of 102 F (38.9 C) or greater.  You develop worsening pain at or near the incision site. MAKE SURE YOU:   Understand these instructions.  Will watch your condition.  Will get help right away if you are not doing well or get worse. Document Released: 06/27/2006 Document Revised: 05/09/2011 Document Reviewed: 08/28/2006 ExitCare Patient Information 2014 ExitCare, [place].   ________________________________________________________________________  WHAT IS A BLOOD TRANSFUSION? Blood Transfusion Information  A transfusion is the replacement of blood or some of its parts. Blood is made up of multiple cells which provide different functions.  Red blood cells carry oxygen and are used for blood loss replacement.  White blood cells fight against infection.  Platelets control bleeding.  Plasma helps clot blood.  Other blood products are available for specialized needs, such as hemophilia or other clotting disorders. BEFORE THE TRANSFUSION  Who gives blood for transfusions?   Healthy volunteers who are fully evaluated to make sure their blood is safe. This is blood bank blood. Transfusion therapy is the safest it has ever been in the practice of medicine. Before blood is taken from a  donor, a complete history is taken to make sure that person has no history of diseases nor engages in risky social behavior (examples are intravenous drug use or sexual activity with multiple partners). The donor's travel history is screened to minimize risk of transmitting infections, such as malaria. The donated blood is tested for signs of infectious diseases, such as HIV and hepatitis. The blood is then tested to be sure it is compatible with you in order to minimize the chance of a transfusion reaction. If you or a relative donates blood, this is often done in anticipation of surgery and is not appropriate for emergency situations. It takes many days to process the donated blood. RISKS AND COMPLICATIONS Although transfusion therapy is very safe and saves many lives, the main dangers of transfusion include:   Getting an infectious disease.  Developing a transfusion reaction. This is an allergic reaction to something in the blood you were given. Every precaution is taken to prevent this. The decision to have a blood transfusion has been considered carefully by your caregiver before blood is given. Blood is not given unless the benefits outweigh the risks. AFTER THE TRANSFUSION  Right after receiving a blood transfusion, you will usually  feel much better and more energetic. This is especially true if your red blood cells have gotten low (anemic). The transfusion raises the level of the red blood cells which carry oxygen, and this usually causes an energy increase.  The nurse administering the transfusion will monitor you carefully for complications. HOME CARE INSTRUCTIONS  No special instructions are needed after a transfusion. You may find your energy is better. Speak with your caregiver about any limitations on activity for underlying diseases you may have. SEEK MEDICAL CARE IF:   Your condition is not improving after your transfusion.  You develop redness or irritation at the intravenous (IV)  site. SEEK IMMEDIATE MEDICAL CARE IF:  Any of the following symptoms occur over the next 12 hours:  Shaking chills.  You have a temperature by mouth above 102 F (38.9 C), not controlled by medicine.  Chest, back, or muscle pain.  People around you feel you are not acting correctly or are confused.  Shortness of breath or difficulty breathing.  Dizziness and fainting.  You get a rash or develop hives.  You have a decrease in urine output.  Your urine turns a dark color or changes to pink, red, or brown. Any of the following symptoms occur over the next 10 days:  You have a temperature by mouth above 102 F (38.9 C), not controlled by medicine.  Shortness of breath.  Weakness after normal activity.  The white part of the eye turns yellow (jaundice).  You have a decrease in the amount of urine or are urinating less often.  Your urine turns a dark color or changes to pink, red, or brown. Document Released: 02/12/2000 Document Revised: 05/09/2011 Document Reviewed: 10/01/2007 [facility] Patient Information 2014 [place], [place].  _______________________________________________________________________

## 2016-04-28 NOTE — Progress Notes (Signed)
Medical clearance 04-04-16 Dr Loralie Champagne on chart LOV 07-07-15 Dr Brigitte Pulse epic CXR 07-07-15 epic EKG 06-25-15 epic

## 2016-04-29 ENCOUNTER — Ambulatory Visit: Payer: Self-pay | Admitting: Orthopedic Surgery

## 2016-04-29 NOTE — H&P (Signed)
Erika Ramsey DOB: 05/16/1945 Single / Language: English / Race: White Female Date of Admission:  05/04/2016 CC:  Right Hip Pain History of Present Illness  The patient is a 71 year old female who comes in for a preoperative History and Physical. The patient is scheduled for a right total hip arthroplasty (anterior) to be performed by Dr. Frank V. Aluisio, MD at  Hospital on 05-04-2016 . The patient is a 70 year old female who presented for follow up of their hip. The patient is being followed for their right. Symptoms reported include: pain, pain with weightbearing and difficulty ambulating. The patient feels that they are doing poorly and report their pain level to be mild to moderate. She has been found that she had end-stage arthritis of the right hip. At that point, she was not quite ready for surgical treatment int he past. Unfortunately, her problems have gotten progressively worse and she is at a stage now where the hip is hurting at all times and limiting what she can and cannot do. She is now at a point where she would like to proceed with surgical fixation. They have been treated conservatively in the past for the above stated problem and despite conservative measures, they continue to have progressive pain and severe functional limitations and dysfunction. They have failed non-operative management including home exercise, medications. It is felt that they would benefit from undergoing total joint replacement. Risks and benefits of the procedure have been discussed with the patient and they elect to proceed with surgery. There are no active contraindications to surgery such as ongoing infection or rapidly progressive neurological disease.  Problem List/Past Medical Primary osteoarthritis of right hip (M16.11)  High blood pressure  Pneumonia  Menopause  Osteopenia   Allergies No Known Drug Allergies   Family History  Cancer  Father, Maternal Grandfather, Mother, Paternal  Grandfather, Paternal Grandmother. Cerebrovascular Accident  Father. Congestive Heart Failure  Mother. Heart Disease  Father, Sister. Hypertension  Mother, Sister.  Social History Children  0 Current drinker  11/12/2015: Currently drinks beer 5-7 times per week Current work status  retired Exercise  does running / walking and other Living situation  live alone Marital status  divorced No history of drug/alcohol rehab  Not under pain contract  Number of flights of stairs before winded  2-3 Tobacco / smoke exposure  11/12/2015: no Tobacco use  Former smoker. 11/12/2015: smoke(d) 1/2 pack(s) per day Advance Directives  Living Will, Healthcare POA  Medication History  Losartan Potassium (100MG Tablet, Oral) Active. AmLODIPine Besylate (5MG Tablet, Oral) Active. Tylenol (325MG Tablet, Oral) Active. ALPRAZolam (0.25MG Tablet, Oral) Active. Chlorthalidone (25MG Tablet, 1/2 tablet Oral) Active. Aspirin (81MG Tablet Chewable, Oral) Active. Boran Active. One-A-Day Vitamin Active. Calcium Supplement Active. Fish Oil Active. Cherry Juice Active.   Past Surgical History Hand Surgery - Foreign Body Removal  around 2003   Review of Systems  General Not Present- Chills, Fatigue, Fever, Memory Loss, Night Sweats, Weight Gain and Weight Loss. Skin Not Present- Eczema, Hives, Itching, Lesions and Rash. HEENT Not Present- Dentures, Double Vision, Headache, Hearing Loss, Tinnitus and Visual Loss. Respiratory Not Present- Allergies, Chronic Cough, Coughing up blood, Shortness of breath at rest and Shortness of breath with exertion. Cardiovascular Not Present- Chest Pain, Difficulty Breathing Lying Down, Murmur, Palpitations, Racing/skipping heartbeats and Swelling. Gastrointestinal Not Present- Abdominal Pain, Bloody Stool, Constipation, Diarrhea, Difficulty Swallowing, Heartburn, Jaundice, Loss of appetitie, Nausea and Vomiting. Female Genitourinary Not Present-  Blood in Urine, Discharge, Flank   Pain, Incontinence, Painful Urination, Urgency, Urinary frequency, Urinary Retention, Urinating at Night and Weak urinary stream. Musculoskeletal Present- Joint Pain. Not Present- Back Pain, Joint Swelling, Morning Stiffness, Muscle Pain, Muscle Weakness and Spasms. Neurological Not Present- Blackout spells, Difficulty with balance, Dizziness, Paralysis, Tremor and Weakness. Psychiatric Not Present- Insomnia.  Vitals Weight: 130 lb Height: 62.5in Body Surface Area: 1.6 m Body Mass Index: 23.4 kg/m  Pulse: 64 (Regular)  BP: 148/78 (Sitting, Right Arm, Standard)  Physical Exam General Mental Status -Alert, cooperative and good historian. General Appearance-pleasant, Not in acute distress. Orientation-Oriented X3. Build & Nutrition-Well nourished and Well developed.  Head and Neck Head-normocephalic, atraumatic . Neck Global Assessment - supple, no bruit auscultated on the right, no bruit auscultated on the left.  Eye Vision-Wears contact lenses. Pupil - Bilateral-Regular and Round. Motion - Bilateral-EOMI.  Chest and Lung Exam Auscultation Breath sounds - clear at anterior chest wall and clear at posterior chest wall. Adventitious sounds - No Adventitious sounds.  Cardiovascular Auscultation Rhythm - Regular rate and rhythm. Heart Sounds - S1 WNL and S2 WNL. Murmurs & Other Heart Sounds - Auscultation of the heart reveals - No Murmurs.  Abdomen Palpation/Percussion Tenderness - Abdomen is non-tender to palpation. Rigidity (guarding) - Abdomen is soft. Auscultation Auscultation of the abdomen reveals - Bowel sounds normal.  Female Genitourinary Note: Not done, not pertinent to present illness   Musculoskeletal Note: On exam, she is alert and oriented, no apparent distress. Right hip can be flexed to 100 with no internal rotation, about 20 or 30 of external rotation, 20 of abduction. Left hip has normal motion.  She has significantly antalgic gait pattern. Pulse, sensation, and motor are intact in right lower extremity.  RADIOGRAPHS I reviewed her radiographs again, she has got bone on bone arthritis of the right hip with some superolateral erosion in the femoral head.  Assessment & Plan Primary osteoarthritis of right hip (M16.11)  Note:Surgical Plans: Right Total Hip Replacement - Anterior Approach  Disposition: Home with Sister to help, She wants to start with HHPT following the hospital stay  PCP: Dr. Karen Ritcher - Patient has been seen preoperatively and felt to be stable for surgery.  IV TXA  Anesthesia Issues: None  Patient was instructed on what medications to stop prior to surgery.  Signed electronically by Alezandrew L Shirell Struthers, III PA-C  

## 2016-05-04 ENCOUNTER — Inpatient Hospital Stay (HOSPITAL_COMMUNITY): Payer: Medicare Other | Admitting: Certified Registered"

## 2016-05-04 ENCOUNTER — Encounter (HOSPITAL_COMMUNITY)
Admission: RE | Disposition: A | Discharge status: Home Health | Payer: Self-pay | Source: Ambulatory Visit | Attending: Orthopedic Surgery

## 2016-05-04 ENCOUNTER — Inpatient Hospital Stay (HOSPITAL_COMMUNITY)
Admission: RE | Admit: 2016-05-04 | Discharge: 2016-05-05 | DRG: 470 | Disposition: A | Discharge status: Home Health | Payer: Medicare Other | Source: Ambulatory Visit | Attending: Orthopedic Surgery | Admitting: Orthopedic Surgery

## 2016-05-04 ENCOUNTER — Encounter (HOSPITAL_COMMUNITY): Payer: Self-pay | Admitting: *Deleted

## 2016-05-04 ENCOUNTER — Inpatient Hospital Stay (HOSPITAL_COMMUNITY): Payer: Medicare Other

## 2016-05-04 DIAGNOSIS — Z91018 Allergy to other foods: Secondary | ICD-10-CM

## 2016-05-04 DIAGNOSIS — Z87891 Personal history of nicotine dependence: Secondary | ICD-10-CM

## 2016-05-04 DIAGNOSIS — Z7982 Long term (current) use of aspirin: Secondary | ICD-10-CM | POA: Diagnosis not present

## 2016-05-04 DIAGNOSIS — I10 Essential (primary) hypertension: Secondary | ICD-10-CM | POA: Diagnosis not present

## 2016-05-04 DIAGNOSIS — M1611 Unilateral primary osteoarthritis, right hip: Secondary | ICD-10-CM | POA: Diagnosis not present

## 2016-05-04 DIAGNOSIS — M169 Osteoarthritis of hip, unspecified: Secondary | ICD-10-CM | POA: Diagnosis present

## 2016-05-04 DIAGNOSIS — Z96649 Presence of unspecified artificial hip joint: Secondary | ICD-10-CM

## 2016-05-04 HISTORY — PX: TOTAL HIP ARTHROPLASTY: SHX124

## 2016-05-04 SURGERY — ARTHROPLASTY, HIP, TOTAL, ANTERIOR APPROACH
Anesthesia: General | Site: Hip | Laterality: Right

## 2016-05-04 MED ORDER — ACETAMINOPHEN 650 MG RE SUPP: 650.0000 mg | Freq: Four times a day (QID) | RECTAL | Status: DC | PRN

## 2016-05-04 MED ORDER — TRAMADOL HCL 50 MG PO TABS: 50.0000 mg | ORAL_TABLET | Freq: Four times a day (QID) | ORAL | Status: DC | PRN

## 2016-05-04 MED ORDER — SODIUM CHLORIDE 0.9 % IV SOLN
INTRAVENOUS | Status: DC
  Administered 2016-05-04: 14:00:00 via INTRAVENOUS
  Administered 2016-05-04: 1000 mL via INTRAVENOUS
  Administered 2016-05-05: 01:00:00 via INTRAVENOUS

## 2016-05-04 MED ORDER — LOSARTAN POTASSIUM 50 MG PO TABS
100.0000 mg | ORAL_TABLET | Freq: Every day | ORAL | Status: DC
  Filled 2016-05-04: qty 2

## 2016-05-04 MED ORDER — FENTANYL CITRATE (PF) 100 MCG/2ML IJ SOLN
INTRAMUSCULAR | Status: AC
  Filled 2016-05-04: qty 2

## 2016-05-04 MED ORDER — LACTATED RINGERS IV SOLN
INTRAVENOUS | Status: DC
  Administered 2016-05-04 (×2): via INTRAVENOUS

## 2016-05-04 MED ORDER — CEFAZOLIN SODIUM-DEXTROSE 2-4 GM/100ML-% IV SOLN
INTRAVENOUS | Status: AC
Start: 2016-05-04 — End: 2016-05-04
  Filled 2016-05-04: qty 100

## 2016-05-04 MED ORDER — DEXAMETHASONE SODIUM PHOSPHATE 10 MG/ML IJ SOLN: 10.0000 mg | Freq: Once | INTRAMUSCULAR | Status: DC

## 2016-05-04 MED ORDER — ROCURONIUM BROMIDE 50 MG/5ML IV SOSY
PREFILLED_SYRINGE | INTRAVENOUS | Status: AC
  Filled 2016-05-04: qty 5

## 2016-05-04 MED ORDER — MIDAZOLAM HCL 2 MG/2ML IJ SOLN
INTRAMUSCULAR | Status: AC
  Filled 2016-05-04: qty 2

## 2016-05-04 MED ORDER — ONDANSETRON HCL 4 MG PO TABS: 4.0000 mg | ORAL_TABLET | Freq: Four times a day (QID) | ORAL | Status: DC | PRN

## 2016-05-04 MED ORDER — ONDANSETRON HCL 4 MG/2ML IJ SOLN: 4.0000 mg | Freq: Once | INTRAMUSCULAR | Status: DC | PRN

## 2016-05-04 MED ORDER — FENTANYL CITRATE (PF) 100 MCG/2ML IJ SOLN
25.0000 ug | INTRAMUSCULAR | Status: DC | PRN
  Administered 2016-05-04: 25 ug via INTRAVENOUS
  Administered 2016-05-04: 50 ug via INTRAVENOUS
  Administered 2016-05-04 (×3): 25 ug via INTRAVENOUS

## 2016-05-04 MED ORDER — BUPIVACAINE HCL (PF) 0.25 % IJ SOLN
INTRAMUSCULAR | Status: AC
  Filled 2016-05-04: qty 30

## 2016-05-04 MED ORDER — METOCLOPRAMIDE HCL 5 MG PO TABS: 5.0000 mg | ORAL_TABLET | Freq: Three times a day (TID) | ORAL | Status: DC | PRN

## 2016-05-04 MED ORDER — ROCURONIUM BROMIDE 10 MG/ML (PF) SYRINGE
PREFILLED_SYRINGE | INTRAVENOUS | Status: DC | PRN
  Administered 2016-05-04: 50 mg via INTRAVENOUS

## 2016-05-04 MED ORDER — ONDANSETRON HCL 4 MG/2ML IJ SOLN
INTRAMUSCULAR | Status: AC
  Filled 2016-05-04: qty 2

## 2016-05-04 MED ORDER — HYDROMORPHONE HCL 1 MG/ML IJ SOLN: 0.5000 mg | INTRAMUSCULAR | Status: DC | PRN

## 2016-05-04 MED ORDER — PROPOFOL 10 MG/ML IV BOLUS
INTRAVENOUS | Status: AC
  Filled 2016-05-04: qty 20

## 2016-05-04 MED ORDER — DEXAMETHASONE SODIUM PHOSPHATE 10 MG/ML IJ SOLN
INTRAMUSCULAR | Status: AC
  Filled 2016-05-04: qty 1

## 2016-05-04 MED ORDER — CEFAZOLIN SODIUM-DEXTROSE 2-4 GM/100ML-% IV SOLN
2.0000 g | INTRAVENOUS | Status: AC
  Administered 2016-05-04: 2 g via INTRAVENOUS

## 2016-05-04 MED ORDER — CLONIDINE HCL 0.1 MG PO TABS: 0.1000 mg | ORAL_TABLET | Freq: Two times a day (BID) | ORAL | Status: DC | PRN

## 2016-05-04 MED ORDER — FLEET ENEMA 7-19 GM/118ML RE ENEM: 1.0000 | ENEMA | Freq: Once | RECTAL | Status: DC | PRN

## 2016-05-04 MED ORDER — MENTHOL 3 MG MT LOZG: 1.0000 | LOZENGE | OROMUCOSAL | Status: DC | PRN

## 2016-05-04 MED ORDER — BISACODYL 10 MG RE SUPP: 10.0000 mg | Freq: Every day | RECTAL | Status: DC | PRN

## 2016-05-04 MED ORDER — CEFAZOLIN SODIUM-DEXTROSE 2-4 GM/100ML-% IV SOLN
2.0000 g | Freq: Four times a day (QID) | INTRAVENOUS | Status: AC
  Administered 2016-05-04 (×2): 2 g via INTRAVENOUS
  Filled 2016-05-04 (×2): qty 100

## 2016-05-04 MED ORDER — DIPHENHYDRAMINE HCL 12.5 MG/5ML PO ELIX: 12.5000 mg | ORAL_SOLUTION | ORAL | Status: DC | PRN

## 2016-05-04 MED ORDER — DOCUSATE SODIUM 100 MG PO CAPS
100.0000 mg | ORAL_CAPSULE | Freq: Two times a day (BID) | ORAL | Status: DC
  Administered 2016-05-04 – 2016-05-05 (×2): 100 mg via ORAL
  Filled 2016-05-04 (×2): qty 1

## 2016-05-04 MED ORDER — CHLORHEXIDINE GLUCONATE 4 % EX LIQD: 60.0000 mL | Freq: Once | CUTANEOUS | Status: DC

## 2016-05-04 MED ORDER — ACETAMINOPHEN 10 MG/ML IV SOLN
INTRAVENOUS | Status: AC
  Filled 2016-05-04: qty 100

## 2016-05-04 MED ORDER — LIP MEDEX EX OINT
TOPICAL_OINTMENT | CUTANEOUS | Status: AC
  Administered 2016-05-04: 18:00:00
  Filled 2016-05-04: qty 7

## 2016-05-04 MED ORDER — SUGAMMADEX SODIUM 200 MG/2ML IV SOLN
INTRAVENOUS | Status: DC | PRN
  Administered 2016-05-04: 150 mg via INTRAVENOUS

## 2016-05-04 MED ORDER — METOCLOPRAMIDE HCL 5 MG/ML IJ SOLN: 5.0000 mg | Freq: Three times a day (TID) | INTRAMUSCULAR | Status: DC | PRN

## 2016-05-04 MED ORDER — POLYETHYLENE GLYCOL 3350 17 G PO PACK: 17.0000 g | PACK | Freq: Every day | ORAL | Status: DC | PRN

## 2016-05-04 MED ORDER — ONDANSETRON HCL 4 MG/2ML IJ SOLN
INTRAMUSCULAR | Status: DC | PRN
  Administered 2016-05-04: 4 mg via INTRAVENOUS

## 2016-05-04 MED ORDER — SUGAMMADEX SODIUM 200 MG/2ML IV SOLN
INTRAVENOUS | Status: AC
  Filled 2016-05-04: qty 2

## 2016-05-04 MED ORDER — OXYCODONE HCL 5 MG PO TABS
5.0000 mg | ORAL_TABLET | ORAL | Status: DC | PRN
  Administered 2016-05-04: 22:00:00 10 mg via ORAL
  Administered 2016-05-04 (×4): 5 mg via ORAL
  Administered 2016-05-05 (×3): 10 mg via ORAL
  Filled 2016-05-04: qty 2
  Filled 2016-05-04: qty 1
  Filled 2016-05-04 (×4): qty 2
  Filled 2016-05-04: qty 1

## 2016-05-04 MED ORDER — AMLODIPINE BESYLATE 5 MG PO TABS
5.0000 mg | ORAL_TABLET | Freq: Every day | ORAL | Status: DC
  Filled 2016-05-04: qty 1

## 2016-05-04 MED ORDER — FENTANYL CITRATE (PF) 100 MCG/2ML IJ SOLN
INTRAMUSCULAR | Status: DC | PRN
  Administered 2016-05-04 (×4): 50 ug via INTRAVENOUS

## 2016-05-04 MED ORDER — BUPIVACAINE HCL (PF) 0.25 % IJ SOLN
INTRAMUSCULAR | Status: DC | PRN
  Administered 2016-05-04: 30 mL

## 2016-05-04 MED ORDER — LIDOCAINE 2% (20 MG/ML) 5 ML SYRINGE
INTRAMUSCULAR | Status: DC | PRN
  Administered 2016-05-04: 100 mg via INTRAVENOUS

## 2016-05-04 MED ORDER — MIDAZOLAM HCL 2 MG/2ML IJ SOLN
INTRAMUSCULAR | Status: DC | PRN
  Administered 2016-05-04 (×2): 1 mg via INTRAVENOUS

## 2016-05-04 MED ORDER — 0.9 % SODIUM CHLORIDE (POUR BTL) OPTIME
TOPICAL | Status: DC | PRN
  Administered 2016-05-04: 1000 mL

## 2016-05-04 MED ORDER — METHOCARBAMOL 500 MG PO TABS
500.0000 mg | ORAL_TABLET | Freq: Four times a day (QID) | ORAL | Status: DC | PRN
  Administered 2016-05-04: 19:00:00 500 mg via ORAL
  Filled 2016-05-04: qty 1

## 2016-05-04 MED ORDER — ACETAMINOPHEN 500 MG PO TABS
1000.0000 mg | ORAL_TABLET | Freq: Four times a day (QID) | ORAL | Status: AC
  Administered 2016-05-04 – 2016-05-05 (×4): 1000 mg via ORAL
  Filled 2016-05-04 (×4): qty 2

## 2016-05-04 MED ORDER — DEXAMETHASONE SODIUM PHOSPHATE 10 MG/ML IJ SOLN
10.0000 mg | Freq: Once | INTRAMUSCULAR | Status: AC
  Administered 2016-05-04: 10 mg via INTRAVENOUS

## 2016-05-04 MED ORDER — PHENYLEPHRINE 40 MCG/ML (10ML) SYRINGE FOR IV PUSH (FOR BLOOD PRESSURE SUPPORT)
PREFILLED_SYRINGE | INTRAVENOUS | Status: DC | PRN
  Administered 2016-05-04: 120 ug via INTRAVENOUS
  Administered 2016-05-04 (×4): 80 ug via INTRAVENOUS
  Administered 2016-05-04 (×2): 120 ug via INTRAVENOUS
  Administered 2016-05-04: 80 ug via INTRAVENOUS
  Administered 2016-05-04: 120 ug via INTRAVENOUS
  Administered 2016-05-04: 80 ug via INTRAVENOUS

## 2016-05-04 MED ORDER — LIDOCAINE 2% (20 MG/ML) 5 ML SYRINGE
INTRAMUSCULAR | Status: AC
  Filled 2016-05-04: qty 5

## 2016-05-04 MED ORDER — PROPOFOL 10 MG/ML IV BOLUS
INTRAVENOUS | Status: DC | PRN
  Administered 2016-05-04: 100 mg via INTRAVENOUS

## 2016-05-04 MED ORDER — ALPRAZOLAM 0.25 MG PO TABS
0.1250 mg | ORAL_TABLET | Freq: Every day | ORAL | Status: DC
  Administered 2016-05-05: 0.125 mg via ORAL
  Filled 2016-05-04: qty 1

## 2016-05-04 MED ORDER — ONDANSETRON HCL 4 MG/2ML IJ SOLN: 4.0000 mg | Freq: Four times a day (QID) | INTRAMUSCULAR | Status: DC | PRN

## 2016-05-04 MED ORDER — PHENOL 1.4 % MT LIQD: 1.0000 | OROMUCOSAL | Status: DC | PRN

## 2016-05-04 MED ORDER — TRANEXAMIC ACID 1000 MG/10ML IV SOLN
1000.0000 mg | INTRAVENOUS | Status: AC
  Administered 2016-05-04: 1000 mg via INTRAVENOUS
  Filled 2016-05-04: qty 1100

## 2016-05-04 MED ORDER — ACETAMINOPHEN 325 MG PO TABS: 650.0000 mg | ORAL_TABLET | Freq: Four times a day (QID) | ORAL | Status: DC | PRN

## 2016-05-04 MED ORDER — TRANEXAMIC ACID 1000 MG/10ML IV SOLN
1000.0000 mg | Freq: Once | INTRAVENOUS | Status: AC
  Administered 2016-05-04: 14:00:00 1000 mg via INTRAVENOUS
  Filled 2016-05-04: qty 1100

## 2016-05-04 MED ORDER — ACETAMINOPHEN 10 MG/ML IV SOLN
1000.0000 mg | Freq: Once | INTRAVENOUS | Status: AC
  Administered 2016-05-04: 1000 mg via INTRAVENOUS

## 2016-05-04 MED ORDER — METHOCARBAMOL 1000 MG/10ML IJ SOLN
500.0000 mg | Freq: Four times a day (QID) | INTRAVENOUS | Status: DC | PRN
  Administered 2016-05-04: 500 mg via INTRAVENOUS
  Filled 2016-05-04: qty 5
  Filled 2016-05-04: qty 550

## 2016-05-04 MED ORDER — PHENYLEPHRINE 40 MCG/ML (10ML) SYRINGE FOR IV PUSH (FOR BLOOD PRESSURE SUPPORT)
PREFILLED_SYRINGE | INTRAVENOUS | Status: AC
  Filled 2016-05-04: qty 20

## 2016-05-04 MED ORDER — SODIUM CHLORIDE 0.9 % IR SOLN: Status: DC | PRN

## 2016-05-04 MED ORDER — RIVAROXABAN 10 MG PO TABS
10.0000 mg | ORAL_TABLET | Freq: Every day | ORAL | Status: DC
  Administered 2016-05-05: 10 mg via ORAL
  Filled 2016-05-04: qty 1

## 2016-05-04 SURGICAL SUPPLY — 34 items
BAG DECANTER FOR FLEXI CONT (MISCELLANEOUS) ×3 IMPLANT
BAG ZIPLOCK 12X15 (MISCELLANEOUS) IMPLANT
BLADE SAG 18X100X1.27 (BLADE) ×3 IMPLANT
CAPT HIP TOTAL 2 ×3 IMPLANT
CLOSURE WOUND 1/2 X4 (GAUZE/BANDAGES/DRESSINGS) ×1
CLOTH BEACON ORANGE TIMEOUT ST (SAFETY) ×3 IMPLANT
COVER PERINEAL POST (MISCELLANEOUS) ×3 IMPLANT
DECANTER SPIKE VIAL GLASS SM (MISCELLANEOUS) ×3 IMPLANT
DRAPE STERI IOBAN 125X83 (DRAPES) ×3 IMPLANT
DRAPE U-SHAPE 47X51 STRL (DRAPES) ×6 IMPLANT
DRSG ADAPTIC 3X8 NADH LF (GAUZE/BANDAGES/DRESSINGS) ×3 IMPLANT
DRSG MEPILEX BORDER 4X4 (GAUZE/BANDAGES/DRESSINGS) ×3 IMPLANT
DRSG MEPILEX BORDER 4X8 (GAUZE/BANDAGES/DRESSINGS) ×3 IMPLANT
DURAPREP 26ML APPLICATOR (WOUND CARE) ×3 IMPLANT
ELECT REM PT RETURN 9FT ADLT (ELECTROSURGICAL) ×3
ELECTRODE REM PT RTRN 9FT ADLT (ELECTROSURGICAL) ×1 IMPLANT
EVACUATOR 1/8 PVC DRAIN (DRAIN) ×3 IMPLANT
GLOVE BIO SURGEON STRL SZ7.5 (GLOVE) ×3 IMPLANT
GLOVE BIO SURGEON STRL SZ8 (GLOVE) ×6 IMPLANT
GLOVE BIOGEL PI IND STRL 8 (GLOVE) ×2 IMPLANT
GLOVE BIOGEL PI INDICATOR 8 (GLOVE) ×4
GOWN STRL REUS W/TWL LRG LVL3 (GOWN DISPOSABLE) ×3 IMPLANT
GOWN STRL REUS W/TWL XL LVL3 (GOWN DISPOSABLE) ×3 IMPLANT
PACK ANTERIOR HIP CUSTOM (KITS) ×3 IMPLANT
STRIP CLOSURE SKIN 1/2X4 (GAUZE/BANDAGES/DRESSINGS) ×2 IMPLANT
SUT ETHIBOND NAB CT1 #1 30IN (SUTURE) ×3 IMPLANT
SUT MNCRL AB 4-0 PS2 18 (SUTURE) ×3 IMPLANT
SUT VIC AB 2-0 CT1 27 (SUTURE) ×4
SUT VIC AB 2-0 CT1 TAPERPNT 27 (SUTURE) ×2 IMPLANT
SUT VLOC 180 0 24IN GS25 (SUTURE) ×3 IMPLANT
SYR 50ML LL SCALE MARK (SYRINGE) IMPLANT
TRAY FOLEY W/METER SILVER 14FR (SET/KITS/TRAYS/PACK) ×3 IMPLANT
TRAY FOLEY W/METER SILVER 16FR (SET/KITS/TRAYS/PACK) IMPLANT
YANKAUER SUCT BULB TIP 10FT TU (MISCELLANEOUS) ×3 IMPLANT

## 2016-05-04 NOTE — Anesthesia Postprocedure Evaluation (Signed)
Anesthesia Post Note  Patient: Erika Ramsey  Procedure(s) Performed: Procedure(s) (LRB): RIGHT TOTAL HIP ARTHROPLASTY ANTERIOR APPROACH (Right)  Patient location during evaluation: PACU Anesthesia Type: General Level of consciousness: awake and alert Pain management: pain level controlled Vital Signs Assessment: post-procedure vital signs reviewed and stable Respiratory status: spontaneous breathing, nonlabored ventilation, respiratory function stable and patient connected to nasal cannula oxygen Cardiovascular status: blood pressure returned to baseline and stable Postop Assessment: no signs of nausea or vomiting Anesthetic complications: no       Last Vitals:  Vitals:   05/04/16 1100 05/04/16 1119  BP: 131/64 125/65  Pulse: (!) 58 (!) 58  Resp: 12 14  Temp: 36.3 C 36.6 C    Last Pain:  Vitals:   05/04/16 1100  TempSrc:   PainSc: [place]

## 2016-05-04 NOTE — Evaluation (Signed)
Physical Therapy Evaluation Patient Details Name: Erika Ramsey MRN: [id number] DOB: [birth year] Today's Date: 05/04/2016   History of Present Illness  Pt s/p R THR  Clinical Impression  Pt s/p R THR and presents with decreased R LE strength/ROM and post op pain limiting functional mobility.  Pt should progress to dc home with family assist and HHPT follow up.   Follow Up Recommendations Home health PT    Equipment Recommendations  None recommended by PT    Recommendations for Other Services OT consult     Precautions / Restrictions Precautions Precautions: Fall Restrictions Weight Bearing Restrictions: No Other Position/Activity Restrictions: WBAT      Mobility  Bed Mobility Overal bed mobility: Needs Assistance Bed Mobility: Supine to Sit     Supine to sit: Min assist     General bed mobility comments: cues for sequence and use of L LE to self assist  Transfers Overall transfer level: Needs assistance Equipment used: Rolling walker (2 wheeled) Transfers: Sit to/from Stand Sit to Stand: Min assist         General transfer comment: cues for LE management and use of UEs to self assist  Ambulation/Gait Ambulation/Gait assistance: Min assist Ambulation Distance (Feet): 25 Feet Assistive device: Rolling walker (2 wheeled) Gait Pattern/deviations: Step-to pattern;Decreased step length - right;Decreased step length - left;Shuffle;Trunk flexed Gait velocity: decr Gait velocity interpretation: Below normal speed for age/gender General Gait Details: cues for posture, sequence and position from [employer]            Wheelchair Mobility    Modified Rankin (Stroke Patients Only)       Balance                                             Pertinent Vitals/Pain Pain Assessment: 0-10 Pain Score: 4  Pain Location: R hip Pain Descriptors / Indicators: Aching;Sore Pain Intervention(s): Limited activity within patient's tolerance;Monitored  during session;Premedicated before session;Ice applied    Home Living Family/patient expects to be discharged to:: Private residence Living Arrangements: Spouse/significant other Available Help at Discharge: Family Type of Home: House Home Access: Stairs to enter Entrance Stairs-Rails: Research scientist (medical) of Steps: 6 sans rail vs 10 with [place]: Able to live on main level with bedroom/bathroom Home Equipment: Walker - 2 wheels;Cane - single point      Prior Function                 Hand Dominance        Extremity/Trunk Assessment   Upper Extremity Assessment Upper Extremity Assessment: Overall WFL for tasks assessed    Lower Extremity Assessment Lower Extremity Assessment: RLE deficits/detail    Cervical / Trunk Assessment Cervical / Trunk Assessment: Normal  Communication   Communication: No difficulties  Cognition Arousal/Alertness: Awake/alert Behavior During Therapy: WFL for tasks assessed/performed Overall Cognitive Status: Within Functional Limits for tasks assessed                      General Comments      Exercises     Assessment/Plan    PT Assessment Patient needs continued PT services  PT Problem List Decreased strength;Decreased range of motion;Decreased activity tolerance;Decreased mobility;Decreased knowledge of use of DME;Pain       PT Treatment Interventions Gait training;DME instruction;Stair training;Functional mobility training;Therapeutic activities;Therapeutic exercise;Patient/family education  PT Goals (Current goals can be found in the Care Plan section)  Acute Rehab PT Goals Patient Stated Goal: Regain IND PT Goal Formulation: With patient Time For Goal Achievement: 05/07/16 Potential to Achieve Goals: Good    Frequency 7X/week   Barriers to discharge        Co-evaluation               End of Session Equipment Utilized During Treatment: Gait belt Activity Tolerance: Patient  tolerated treatment well Patient left: in chair;with call bell/phone within reach;with chair alarm set Nurse Communication: Mobility status PT Visit Diagnosis: Difficulty in walking, not elsewhere classified (R26.2)         Time: 3735-7897 PT Time Calculation (min) (ACUTE ONLY): 23 min   Charges:   PT Evaluation $PT Eval Low Complexity: 1 Procedure PT Treatments $Gait Training: 8-22 mins   PT G Codes:         Andrena Margerum 05/04/2016, 4:32 PM

## 2016-05-04 NOTE — Anesthesia Preprocedure Evaluation (Addendum)
Anesthesia Evaluation  Patient identified by MRN, date of birth, ID band Patient awake    Reviewed: Allergy & Precautions, NPO status , Patient's Chart, lab work & pertinent test results  Airway Mallampati: II  TM Distance: >3 FB Neck ROM: Full    Dental  (+) Teeth Intact, Dental Advisory Given   Pulmonary former smoker,    Pulmonary exam normal breath sounds clear to auscultation       Cardiovascular hypertension, Pt. on medications Normal cardiovascular exam Rhythm:Regular Rate:Normal     Neuro/Psych negative neurological ROS     GI/Hepatic negative GI ROS, Neg liver ROS,   Endo/Other  negative endocrine ROS  Renal/GU negative Renal ROS     Musculoskeletal negative musculoskeletal ROS (+)   Abdominal   Peds  Hematology Plt 233k   Anesthesia Other Findings Day of surgery medications reviewed with the patient.  Reproductive/Obstetrics                             Anesthesia Physical Anesthesia Plan  ASA: II  Anesthesia Plan: General   Post-op Pain Management:    Induction: Intravenous  Airway Management Planned: Oral ETT  Additional Equipment:   Intra-op Plan:   Post-operative Plan: Extubation in OR  Informed Consent: I have reviewed the patients History and Physical, chart, labs and discussed the procedure including the risks, benefits and alternatives for the proposed anesthesia with the patient or authorized representative who has indicated his/her understanding and acceptance.   Dental advisory given  Plan Discussed with: CRNA  Anesthesia Plan Comments: (Risks/benefits of general anesthesia discussed with patient including risk of damage to teeth, lips, gum, and tongue, nausea/vomiting, allergic reactions to medications, and the possibility of heart attack, stroke and death.  All patient questions answered.  Patient wishes to proceed.)        Anesthesia Quick  Evaluation

## 2016-05-04 NOTE — Anesthesia Procedure Notes (Signed)
Procedure Name: Intubation Date/Time: 05/04/2016 8:33 AM Performed by: Cynda Familia Pre-anesthesia Checklist: Patient identified, Emergency Drugs available, Suction available and Patient being monitored Patient Re-evaluated:Patient Re-evaluated prior to inductionOxygen Delivery Method: Circle System Utilized Preoxygenation: Pre-oxygenation with 100% oxygen Intubation Type: IV induction Ventilation: Mask ventilation without difficulty Laryngoscope Size: Miller and 2 Grade View: Grade I Tube type: Oral Number of attempts: 1 Airway Equipment and Method: Stylet Placement Confirmation: ETT inserted through vocal cords under direct vision,  positive ETCO2 and breath sounds checked- equal and bilateral Secured at: 20 cm Tube secured with: Tape Dental Injury: Teeth and Oropharynx as per pre-operative assessment  Comments: Smooth IV induction--  Gifford Shave MD--  Intubation AM CRNA atraumatic--- teeth and mouth as preop --   bilat BS Gifford Shave

## 2016-05-04 NOTE — Transfer of Care (Signed)
Immediate Anesthesia Transfer of Care Note  Patient: Erika Ramsey  Procedure(s) Performed: Procedure(s): RIGHT TOTAL HIP ARTHROPLASTY ANTERIOR APPROACH (Right)  Patient Location: PACU  Anesthesia Type:General  Level of Consciousness: awake and alert   Airway & Oxygen Therapy: Patient Spontanous Breathing and Patient connected to face mask oxygen  Post-op Assessment: Report given to RN and Post -op Vital signs reviewed and stable  Post vital signs: Reviewed and stable  Last Vitals:  Vitals:   05/04/16 0604  BP: (!) 142/64  Pulse: 78  Resp: 18  Temp: 36.7 C    Last Pain:  Vitals:   05/04/16 0614  TempSrc:   PainSc: 2       Patients Stated Pain Goal: 3 (01/65/80 0634)  Complications: No apparent anesthesia complications

## 2016-05-04 NOTE — H&P (View-Only) (Signed)
Erika Ramsey DOB: 08-29-1945 Single / Language: Cleophus Molt / Race: White Female Date of Admission:  05/04/2016 CC:  Right Hip Pain History of Present Illness  The patient is a 71 year old female who comes in for a preoperative History and Physical. The patient is scheduled for a right total hip arthroplasty (anterior) to be performed by Dr. Dione Plover. Aluisio, MD at [facility] on 05-04-2016 . The patient is a 71 year old female who presented for follow up of their hip. The patient is being followed for their right. Symptoms reported include: pain, pain with weightbearing and difficulty ambulating. The patient feels that they are doing poorly and report their pain level to be mild to moderate. She has been found that she had end-stage arthritis of the right hip. At that point, she was not quite ready for surgical treatment int he past. Unfortunately, her problems have gotten progressively worse and she is at a stage now where the hip is hurting at all times and limiting what she can and cannot do. She is now at a point where she would like to proceed with surgical fixation. They have been treated conservatively in the past for the above stated problem and despite conservative measures, they continue to have progressive pain and severe functional limitations and dysfunction. They have failed non-operative management including home exercise, medications. It is felt that they would benefit from undergoing total joint replacement. Risks and benefits of the procedure have been discussed with the patient and they elect to proceed with surgery. There are no active contraindications to surgery such as ongoing infection or rapidly progressive neurological disease.  Problem List/Past Medical Primary osteoarthritis of right hip (M16.11)  High blood pressure  Pneumonia  Menopause  Osteopenia   Allergies No Known Drug Allergies   Family History  Cancer  Father, Maternal Grandfather, Mother, Paternal  Jon Gills, Paternal Grandmother. Cerebrovascular Accident  Father. Congestive Heart Failure  Mother. Heart Disease  Father, Sister. Hypertension  Mother, Sister.  Social History Children  0 Current drinker  11/12/2015: Currently drinks beer 5-7 times per week Current work status  retired Exercise  does running / walking and other Living situation  live alone Marital status  divorced No history of drug/alcohol rehab  Not under pain contract  Number of flights of stairs before winded  2-3 Tobacco / smoke exposure  11/12/2015: no Tobacco use  Former smoker. 11/12/2015: smoke(d) 1/2 pack(s) per day Advance Directives  Living Will, Healthcare POA  Medication History  Losartan Potassium (100MG  Tablet, Oral) Active. AmLODIPine Besylate (5MG  Tablet, Oral) Active. Tylenol (325MG  Tablet, Oral) Active. ALPRAZolam (0.25MG  Tablet, Oral) Active. Chlorthalidone (25MG  Tablet, 1/2 tablet Oral) Active. Aspirin (81MG  Tablet Chewable, Oral) Active. Boran Active. One-A-Day Vitamin Active. Calcium Supplement Active. Fish Oil Active. Cherry Juice Active.   Past Surgical History Hand Surgery - Foreign Body Removal  around 2003   Review of Systems  General Not Present- Chills, Fatigue, Fever, Memory Loss, Night Sweats, Weight Gain and Weight Loss. Skin Not Present- Eczema, Hives, Itching, Lesions and Rash. HEENT Not Present- Dentures, Double Vision, Headache, Hearing Loss, Tinnitus and Visual Loss. Respiratory Not Present- Allergies, Chronic Cough, Coughing up blood, Shortness of breath at rest and Shortness of breath with exertion. Cardiovascular Not Present- Chest Pain, Difficulty Breathing Lying Down, Murmur, Palpitations, Racing/skipping heartbeats and Swelling. Gastrointestinal Not Present- Abdominal Pain, Bloody Stool, Constipation, Diarrhea, Difficulty Swallowing, Heartburn, Jaundice, Loss of appetitie, Nausea and Vomiting. Female Genitourinary Not Present-  Blood in Urine, Discharge, Flank  Pain, Incontinence, Painful Urination, Urgency, Urinary frequency, Urinary Retention, Urinating at Night and Weak urinary stream. Musculoskeletal Present- Joint Pain. Not Present- Back Pain, Joint Swelling, Morning Stiffness, Muscle Pain, Muscle Weakness and Spasms. Neurological Not Present- Blackout spells, Difficulty with balance, Dizziness, Paralysis, Tremor and Weakness. Psychiatric Not Present- Insomnia.  Vitals Weight: 130 lb Height: 62.5in Body Surface Area: 1.6 m Body Mass Index: 23.4 kg/m  Pulse: 64 (Regular)  BP: 148/78 (Sitting, Right Arm, Standard)  Physical Exam General Mental Status -Alert, cooperative and good historian. General Appearance-pleasant, Not in acute distress. Orientation-Oriented X3. Build & Nutrition-Well nourished and Well developed.  Head and Neck Head-normocephalic, atraumatic . Neck Global Assessment - supple, no bruit auscultated on the right, no bruit auscultated on the left.  Eye Vision-Wears contact lenses. Pupil - Bilateral-Regular and Round. Motion - Bilateral-EOMI.  Chest and Lung Exam Auscultation Breath sounds - clear at anterior chest wall and clear at posterior chest wall. Adventitious sounds - No Adventitious sounds.  Cardiovascular Auscultation Rhythm - Regular rate and rhythm. Heart Sounds - S1 WNL and S2 WNL. Murmurs & Other Heart Sounds - Auscultation of the heart reveals - No Murmurs.  Abdomen Palpation/Percussion Tenderness - Abdomen is non-tender to palpation. Rigidity (guarding) - Abdomen is soft. Auscultation Auscultation of the abdomen reveals - Bowel sounds normal.  Female Genitourinary Note: Not done, not pertinent to present illness   Musculoskeletal Note: On exam, she is alert and oriented, no apparent distress. Right hip can be flexed to 100 with no internal rotation, about 20 or 30 of external rotation, 20 of abduction. Left hip has normal motion.  She has significantly antalgic gait pattern. Pulse, sensation, and motor are intact in right lower extremity.  RADIOGRAPHS I reviewed her radiographs again, she has got bone on bone arthritis of the right hip with some superolateral erosion in the femoral head.  Assessment & Plan Primary osteoarthritis of right hip (M16.11)  Note:Surgical Plans: Right Total Hip Replacement - Anterior Approach  Disposition: Home with Sister to help, She wants to start with HHPT following the hospital stay  PCP: Dr. Ival Bible - Patient has been seen preoperatively and felt to be stable for surgery.  IV TXA  Anesthesia Issues: None  Patient was instructed on what medications to stop prior to surgery.  Signed electronically by Ok Edwards, III PA-C

## 2016-05-04 NOTE — Op Note (Signed)
OPERATIVE REPORT- TOTAL HIP ARTHROPLASTY   PREOPERATIVE DIAGNOSIS: Osteoarthritis of the Right hip.   POSTOPERATIVE DIAGNOSIS: Osteoarthritis of the Right  hip.   PROCEDURE: Right total hip arthroplasty, anterior approach.   SURGEON: [name], MD   ASSISTANT: Arlee Muslim, PA-C  ANESTHESIA:  General  ESTIMATED BLOOD LOSS:-225 ml   DRAINS: Hemovac x1.   COMPLICATIONS: None   CONDITION: PACU - hemodynamically stable.   BRIEF CLINICAL NOTE: Erika Ramsey is a 71 y.o. female who has advanced end-  stage arthritis of their Right  hip with progressively worsening pain and  dysfunction.The patient has failed nonoperative management and presents for  total hip arthroplasty.   PROCEDURE IN DETAIL: After successful administration of spinal  anesthetic, the traction boots for the [facility] bed were placed on both  feet and the patient was placed onto the [facility] bed, boots placed into the leg  holders. The Right hip was then isolated from the perineum with plastic  drapes and prepped and draped in the usual sterile fashion. ASIS and  greater trochanter were marked and a oblique incision was made, starting  at about 1 cm lateral and 2 cm distal to the ASIS and coursing towards  the anterior cortex of the femur. The skin was cut with a 10 blade  through subcutaneous tissue to the level of the fascia overlying the  tensor fascia lata muscle. The fascia was then incised in line with the  incision at the junction of the anterior third and posterior 2/3rd. The  muscle was teased off the fascia and then the interval between the TFL  and the rectus was developed. The Hohmann retractor was then placed at  the top of the femoral neck over the capsule. The vessels overlying the  capsule were cauterized and the fat on top of the capsule was removed.  A Hohmann retractor was then placed anterior underneath the rectus  femoris to give exposure to the entire anterior capsule. A T-shaped   capsulotomy was performed. The edges were tagged and the femoral head  was identified.       Osteophytes are removed off the superior acetabulum.  The femoral neck was then cut in situ with an oscillating saw. Traction  was then applied to the left lower extremity utilizing the [facility]  traction. The femoral head was then removed. Retractors were placed  around the acetabulum and then circumferential removal of the labrum was  performed. Osteophytes were also removed. Reaming starts at 43 mm to  medialize and  Increased in 2 mm increments to 47 mm. We reamed in  approximately 40 degrees of abduction, 20 degrees anteversion. A 48 mm  pinnacle acetabular shell was then impacted in anatomic position under  fluoroscopic guidance with excellent purchase. We did not need to place  any additional dome screws. A 28 mm neutral + 4 marathon liner was then  placed into the acetabular shell.       The femoral lift was then placed along the lateral aspect of the femur  just distal to the vastus ridge. The leg was  externally rotated and capsule  was stripped off the inferior aspect of the femoral neck down to the  level of the lesser trochanter, this was done with electrocautery. The femur was lifted after this was performed. The  leg was then placed in an extended and adducted position essentially delivering the femur. We also removed the capsule superiorly and the piriformis from the piriformis fossa  to gain excellent exposure of the  proximal femur. Rongeur was used to remove some cancellous bone to get  into the lateral portion of the proximal femur for placement of the  initial starter reamer. The starter broaches was placed  the starter broach  and was shown to go down the center of the canal. Broaching  with the  Corail system was then performed starting at size 8, coursing  Up to size 10. A size 10 had excellent torsional and rotational  and axial stability. The trial standard offset neck was then  placed  with a 28 + 1.5 trial head. The hip was then reduced. We confirmed that  the stem was in the canal both on AP and lateral x-rays. It also has excellent sizing. The hip was reduced with outstanding stability through full extension and full external rotation.. AP pelvis was taken and the leg lengths were measured and found to be equal. Hip was then dislocated again and the femoral head and neck removed. The  femoral broach was removed. Size 10 Corail stem with a standard offset  neck was then impacted into the femur following native anteversion. Has  excellent purchase in the canal. Excellent torsional and rotational and  axial stability. It is confirmed to be in the canal on AP and lateral  fluoroscopic views. The 28 + 1.5 ceramic head was placed and the hip  reduced with outstanding stability. Again AP pelvis was taken and it  confirmed that the leg lengths were equal. The wound was then copiously  irrigated with saline solution and the capsule reattached and repaired  with Ethibond suture. 30 ml of .25% Bupivicaine was  injected into the capsule and into the edge of the tensor fascia lata as well as subcutaneous tissue. The fascia overlying the tensor fascia lata was then closed with a running #1 V-Loc. Subcu was closed with interrupted 2-0 Vicryl and subcuticular running 4-0 Monocryl. Incision was cleaned  and dried. Steri-Strips and a bulky sterile dressing applied. Hemovac  drain was hooked to suction and then the patient was awakened and transported to  recovery in stable condition.        Please note that a surgical assistant was a medical necessity for this procedure to perform it in a safe and expeditious manner. Assistant was necessary to provide appropriate retraction of vital neurovascular structures and to prevent femoral fracture and allow for anatomic placement of the prosthesis.  [name], M.D.

## 2016-05-04 NOTE — Interval H&P Note (Signed)
History and Physical Interval Note:  05/04/2016 7:58 AM  Erika Ramsey  has presented today for surgery, with the diagnosis of OA Right hip  The various methods of treatment have been discussed with the patient and family. After consideration of risks, benefits and other options for treatment, the patient has consented to  Procedure(s): RIGHT TOTAL HIP ARTHROPLASTY ANTERIOR APPROACH (Right) as a surgical intervention .  The patient's history has been reviewed, patient examined, no change in status, stable for surgery.  I have reviewed the patient's chart and labs.  Questions were answered to the patient's satisfaction.     Gearlean Alf

## 2016-05-05 LAB — BASIC METABOLIC PANEL
ANION GAP: 6 (ref 5–15)
BUN: 12 mg/dL (ref 6–20)
CHLORIDE: 107 mmol/L (ref 101–111)
CO2: 24 mmol/L (ref 22–32)
CREATININE: 0.75 mg/dL (ref 0.44–1.00)
Calcium: 7.8 mg/dL — ABNORMAL LOW (ref 8.9–10.3)
GFR calc non Af Amer: >60 mL/min (ref >60)
GLUCOSE: 114 mg/dL — AB (ref 65–99)
Potassium: 3.3 mmol/L — ABNORMAL LOW (ref 3.5–5.1)
Sodium: 137 mmol/L (ref 135–145)

## 2016-05-05 LAB — CBC
HEMATOCRIT: 28.9 % — AB (ref 36.0–46.0)
HEMOGLOBIN: 10 g/dL — AB (ref 12.0–15.0)
MCH: 31.8 pg (ref 26.0–34.0)
MCHC: 34.6 g/dL (ref 30.0–36.0)
MCV: 92 fL (ref 78.0–100.0)
Platelets: 171 10*3/uL (ref 150–400)
RBC: 3.14 MIL/uL — ABNORMAL LOW (ref 3.87–5.11)
RDW: 12.4 % (ref 11.5–15.5)
WBC: 11.7 10*3/uL — ABNORMAL HIGH (ref 4.0–10.5)

## 2016-05-05 LAB — TYPE AND SCREEN
ABO/RH(D): A NEG
ANTIBODY SCREEN: NEGATIVE

## 2016-05-05 MED ORDER — RIVAROXABAN 10 MG PO TABS: 10.0000 mg | ORAL_TABLET | Freq: Every day | ORAL | 0 refills | Status: DC

## 2016-05-05 MED ORDER — TRAMADOL HCL 50 MG PO TABS: 50.0000 mg | ORAL_TABLET | Freq: Four times a day (QID) | ORAL | 0 refills | Status: DC | PRN

## 2016-05-05 MED ORDER — OXYCODONE HCL 5 MG PO TABS: 5.0000 mg | ORAL_TABLET | ORAL | 0 refills | Status: DC | PRN

## 2016-05-05 MED ORDER — METHOCARBAMOL 500 MG PO TABS: 500.0000 mg | ORAL_TABLET | Freq: Four times a day (QID) | ORAL | 0 refills | Status: DC | PRN

## 2016-05-05 NOTE — Discharge Instructions (Addendum)
° °Dr. Frank Aluisio °Total Joint Specialist °[facility] °[address] °[place], [place] [address] °[phone] ° °ANTERIOR APPROACH TOTAL HIP REPLACEMENT POSTOPERATIVE DIRECTIONS ° ° °Hip Rehabilitation, Guidelines Following Surgery  °The results of a hip operation are greatly improved after range of motion and muscle strengthening exercises. Follow all safety measures which are given to protect your hip. If any of these exercises cause increased pain or swelling in your joint, decrease the amount until you are comfortable again. Then slowly increase the exercises. Call your caregiver if you have problems or questions.  ° °HOME CARE INSTRUCTIONS  °Remove items at home which could result in a fall. This includes throw rugs or furniture in walking pathways.  °· ICE to the affected hip every three hours for 30 minutes at a time and then as needed for pain and swelling.  Continue to use ice on the hip for pain and swelling from surgery. You may notice swelling that will progress down to the foot and ankle.  This is normal after surgery.  Elevate the leg when you are not up walking on it.   °· Continue to use the breathing machine which will help keep your temperature down.  It is common for your temperature to cycle up and down following surgery, especially at night when you are not up moving around and exerting yourself.  The breathing machine keeps your lungs expanded and your temperature down. ° ° °DIET °You may resume your previous home diet once your are discharged from the hospital. ° °DRESSING / WOUND CARE / SHOWERING °You may shower 3 days after surgery, but keep the wounds dry during showering.  You may use an occlusive plastic wrap (Press'n Seal for example), NO SOAKING/SUBMERGING IN THE BATHTUB.  If the bandage gets wet, change with a clean dry gauze.  If the incision gets wet, pat the wound dry with a clean towel. °You may start showering once you are discharged home but do not  submerge the incision under water. Just pat the incision dry and apply a dry gauze dressing on daily. °Change the surgical dressing daily and reapply a dry dressing each time. ° °ACTIVITY °Walk with your walker as instructed. °Use walker as long as suggested by your caregivers. °Avoid periods of inactivity such as sitting longer than an hour when not asleep. This helps prevent blood clots.  °You may resume a sexual relationship in one month or when given the OK by your doctor.  °You may return to work once you are cleared by your doctor.  °Do not drive a car for 6 weeks or until released by you surgeon.  °Do not drive while taking narcotics. ° °WEIGHT BEARING °Weight bearing as tolerated with assist device (walker, cane, etc) as directed, use it as long as suggested by your surgeon or therapist, typically at least 4-6 weeks. ° °POSTOPERATIVE CONSTIPATION PROTOCOL °Constipation - defined medically as fewer than three stools per week and severe constipation as less than one stool per week. ° °One of the most common issues patients have following surgery is constipation.  Even if you have a regular bowel pattern at home, your normal regimen is likely to be disrupted due to multiple reasons following surgery.  Combination of anesthesia, postoperative narcotics, change in appetite and fluid intake all can affect your bowels.  In order to avoid complications following surgery, here are some recommendations in order to help you during your recovery period. ° °Colace (docusate) - Pick up an over-the-counter   form of Colace or another stool softener and take twice a day as long as you are requiring postoperative pain medications.  Take with a full glass of water daily.  If you experience loose stools or diarrhea, hold the colace until you stool forms back up.  If your symptoms do not get better within 1 week or if they get worse, check with your doctor. ° °Dulcolax (bisacodyl) - Pick up over-the-counter and take as directed  by the product packaging as needed to assist with the movement of your bowels.  Take with a full glass of water.  Use this product as needed if not relieved by Colace only.  ° °MiraLax (polyethylene glycol) - Pick up over-the-counter to have on hand.  MiraLax is a solution that will increase the amount of water in your bowels to assist with bowel movements.  Take as directed and can mix with a glass of water, juice, soda, coffee, or tea.  Take if you go more than two days without a movement. °Do not use MiraLax more than once per day. Call your doctor if you are still constipated or irregular after using this medication for 7 days in a row. ° °If you continue to have problems with postoperative constipation, please contact the office for further assistance and recommendations.  If you experience "the worst abdominal pain ever" or develop nausea or vomiting, please contact the office immediatly for further recommendations for treatment. ° °ITCHING ° If you experience itching with your medications, try taking only a single pain pill, or even half a pain pill at a time.  You can also use Benadryl over the counter for itching or also to help with sleep.  ° °TED HOSE STOCKINGS °Wear the elastic stockings on both legs for three weeks following surgery during the day but you may remove then at night for sleeping. ° °MEDICATIONS °See your medication summary on the “After Visit Summary” that the nursing staff will review with you prior to discharge.  You may have some home medications which will be placed on hold until you complete the course of blood thinner medication.  It is important for you to complete the blood thinner medication as prescribed by your surgeon.  Continue your approved medications as instructed at time of discharge. ° °PRECAUTIONS °If you experience chest pain or shortness of breath - call 911 immediately for transfer to the hospital emergency department.  °If you develop a fever greater that 101 F,  purulent drainage from wound, increased redness or drainage from wound, foul odor from the wound/dressing, or calf pain - CONTACT YOUR SURGEON.   °                                                °FOLLOW-UP APPOINTMENTS °Make sure you keep all of your appointments after your operation with your surgeon and caregivers. You should call the office at the above phone number and make an appointment for approximately two weeks after the date of your surgery or on the date instructed by your surgeon outlined in the "After Visit Summary". ° °RANGE OF MOTION AND STRENGTHENING EXERCISES  °These exercises are designed to help you keep full movement of your hip joint. Follow your caregiver's or physical therapist's instructions. Perform all exercises about fifteen times, three times per day or as directed. Exercise both hips, even if you   have had only one joint replacement. These exercises can be done on a training (exercise) mat, on the floor, on a table or on a bed. Use whatever works the best and is most comfortable for you. Use music or television while you are exercising so that the exercises are a pleasant break in your day. This will make your life better with the exercises acting as a break in routine you can look forward to.  °Lying on your back, slowly slide your foot toward your buttocks, raising your knee up off the floor. Then slowly slide your foot back down until your leg is straight again.  °Lying on your back spread your legs as far apart as you can without causing discomfort.  °Lying on your side, raise your upper leg and foot straight up from the floor as far as is comfortable. Slowly lower the leg and repeat.  °Lying on your back, tighten up the muscle in the front of your thigh (quadriceps muscles). You can do this by keeping your leg straight and trying to raise your heel off the floor. This helps strengthen the largest muscle supporting your knee.  °Lying on your back, tighten up the muscles of your  buttocks both with the legs straight and with the knee bent at a comfortable angle while keeping your heel on the floor.  ° °IF YOU ARE TRANSFERRED TO A SKILLED REHAB FACILITY °If the patient is transferred to a skilled rehab facility following release from the hospital, a list of the current medications will be sent to the facility for the patient to continue.  When discharged from the skilled rehab facility, please have the facility set up the patient's Home Health Physical Therapy prior to being released. Also, the skilled facility will be responsible for providing the patient with their medications at time of release from the facility to include their pain medication, the muscle relaxants, and their blood thinner medication. If the patient is still at the rehab facility at time of the two week follow up appointment, the skilled rehab facility will also need to assist the patient in arranging follow up appointment in our office and any transportation needs. ° °MAKE SURE YOU:  °Understand these instructions.  °Get help right away if you are not doing well or get worse.  ° ° °Pick up stool softner and laxative for home use following surgery while on pain medications. °Do not submerge incision under water. °Please use good hand washing techniques while changing dressing each day. °May shower starting three days after surgery. °Please use a clean towel to pat the incision dry following showers. °Continue to use ice for pain and swelling after surgery. °Do not use any lotions or creams on the incision until instructed by your surgeon. ° °Take Xarelto for two and a half more weeks following discharge from the hospital, then discontinue Xarelto. °Once the patient has completed the Xarelto, they may resume the 81 mg Aspirin. ° ° ° °Information on my medicine - XARELTO® (Rivaroxaban) ° °This medication education was reviewed with me or my healthcare representative as part of my discharge preparation.  ° °Why was Xarelto®  prescribed for you? °Xarelto® was prescribed for you to reduce the risk of blood clots forming after orthopedic surgery. The medical term for these abnormal blood clots is venous thromboembolism (VTE). ° °What do you need to know about xarelto® ? °Take your Xarelto® ONCE DAILY at the same time every day. °You may take it either with or without food. ° °  If you have difficulty swallowing the tablet whole, you may crush it and mix in applesauce just prior to taking your dose. ° °Take Xarelto® exactly as prescribed by your doctor and DO NOT stop taking Xarelto® without talking to the doctor who prescribed the medication.  Stopping without other VTE prevention medication to take the place of Xarelto® may increase your risk of developing a clot. ° °After discharge, you should have regular check-up appointments with your healthcare provider that is prescribing your Xarelto®.   ° °What do you do if you miss a dose? °If you miss a dose, take it as soon as you remember on the same day then continue your regularly scheduled once daily regimen the next day. Do not take two doses of Xarelto® on the same day.  ° °Important Safety Information °A possible side effect of Xarelto® is bleeding. You should call your healthcare provider right away if you experience any of the following: °? Bleeding from an injury or your nose that does not stop. °? Unusual colored urine (red or dark brown) or unusual colored stools (red or black). °? Unusual bruising for unknown reasons. °? A serious fall or if you hit your head (even if there is no bleeding). ° °Some medicines may interact with Xarelto® and might increase your risk of bleeding while on Xarelto®. To help avoid this, consult your healthcare provider or pharmacist prior to using any new prescription or non-prescription medications, including herbals, vitamins, non-steroidal anti-inflammatory drugs (NSAIDs) and supplements. ° °This website has more information on Xarelto®:  www.xarelto.com. ° ° °

## 2016-05-05 NOTE — Care Management Note (Signed)
Case Management Note  Patient Details  Name: MACALA BALDONADO MRN: [id number] Date of Birth: 02-16-1946  Subjective/Objective:                  RIGHT TOTAL HIP ARTHROPLASTY ANTERIOR APPROACH (Right) Action/Plan: Discharge planning Expected Discharge Date:  05/05/16               Expected Discharge Plan:  [place]  In-House Referral:     Discharge planning Services  CM Consult  Post Acute Care Choice:  Home Health, [place] Choice offered to:  Patient  DME Arranged:  N/A DME Agency:  NA  HH Arranged:  PT [place] Agency:  [facility] ([facility]  Status of Service:  Completed, signed off  If discussed at [employer], dates discussed:    Additional Comments: CM met with pt in room to offer choice of home health agency. Pt chooses [facility] to render HHPT. Referral given to [facility] rep, Tim. Pt states she has all the DME needed at home. NO other CM needs were communicated. Dellie Catholic, RN 05/05/2016, 11:49 AM

## 2016-05-05 NOTE — Progress Notes (Signed)
   Subjective: 1 Day Post-Op Procedure(s) (LRB): RIGHT TOTAL HIP ARTHROPLASTY ANTERIOR APPROACH (Right) Patient reports pain as mild and moderate.   Patient seen in rounds by Dr. Wynelle Link. Patient is well, but has had some minor complaints of pain in the hip, requiring pain medications We will resume therapy today. She was able to walk 25 feet with therapy yesterday following surgery.  If they do well with therapy and meets all goals today, then will allow home later this afternoon following therapy.  She does have many stairs at home and will need to work with the therapist on stair training prior to discharge.   Plan is to go Home after hospital stay.  Objective: Vital signs in last 24 hours: Temp:  [97.4 F (36.3 C)-98.4 F (36.9 C)] 98.2 F (36.8 C) (03/08 0603) Pulse Rate:  [58-94] 65 (03/08 0603) Resp:  [12-17] 17 (03/08 0603) BP: (108-140)/(57-79) 127/64 (03/08 0603) SpO2:  [96 %-100 %] 96 % (03/08 0603)  Intake/Output from previous day:  Intake/Output Summary (Last 24 hours) at 05/05/16 0802 Last data filed at 05/05/16 0200  Gross per 24 hour  Intake             4380 ml  Output             3515 ml  Net              865 ml    Intake/Output this shift: No intake/output data recorded.  Labs:  Recent Labs  05/05/16 0421  HGB 10.0*    Recent Labs  05/05/16 0421  WBC 11.7*  RBC 3.14*  HCT 28.9*  PLT 171    Recent Labs  05/05/16 0421  NA 137  K 3.3*  CL 107  CO2 24  BUN 12  CREATININE 0.75  GLUCOSE 114*  CALCIUM 7.8*   No results for input(s): LABPT, INR in the last 72 hours.  EXAM General - Patient is Alert, Appropriate and Oriented Extremity - Neurovascular intact Sensation intact distally Intact pulses distally Dressing - dressing C/D/I Motor Function - intact, moving foot and toes well on exam.  Hemovac pulled without difficulty.  Past Medical History:  Diagnosis Date  . Atrophic vaginitis   . Breast cyst   . 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 Decreased libido   . Hypertension   . Osteopenia 02/2012   T score -1.6 FRAX 9.6%/ 1.3%    Assessment/Plan: 1 Day Post-Op Procedure(s) (LRB): RIGHT TOTAL HIP ARTHROPLASTY ANTERIOR APPROACH (Right) Principal Problem:   OA (osteoarthritis) of hip  Estimated body mass index is 23.21 kg/m as calculated from the following:   Height as of this encounter: 5\' 3"  (1.6 m).   Weight as of this encounter: 59.4 kg (131 lb). Up with therapy Discharge home with home health  DVT Prophylaxis - Xarelto Weight Bearing As Tolerated right Leg Hemovac Pulled Begin Therapy  If meets goals and able to go home: Up with therapy Discharge home with home health Diet - Cardiac diet Follow up - in 2 weeks Activity - WBAT Disposition - Home Condition Upon Discharge - pending therapy goals D/C Meds - See DC Summary DVT Prophylaxis - Xarelto  Arlee Muslim, PA-C Orthopaedic Surgery 05/05/2016, 8:02 AM

## 2016-05-05 NOTE — Evaluation (Signed)
Occupational Therapy Evaluation Patient Details Name: Erika Ramsey MRN: [id number] DOB: 10-24-1945 Today's Date: 05/05/2016    History of Present Illness Pt s/p R THR, DA   Clinical Impression   This 71 year old female was admitted for the above sx. All education was completed. No further OT is needed at this time    Follow Up Recommendations  No OT follow up    Equipment Recommendations  None recommended by OT    Recommendations for Other Services       Precautions / Restrictions Precautions Precautions: Fall Restrictions Weight Bearing Restrictions: No Other Position/Activity Restrictions: WBAT      Mobility Bed Mobility               General bed mobility comments: oob  Transfers Overall transfer level: Needs assistance Equipment used: Rolling walker (2 wheeled) Transfers: Sit to/from Stand Sit to Stand: Supervision         General transfer comment: cues for LE management and use of UEs to self assist    Balance                                            ADL Overall ADL's : Needs assistance/impaired Eating/Feeding: Independent   Grooming: Supervision/safety;Standing   Upper Body Bathing: Set up;Sitting   Lower Body Bathing: Minimal assistance;Sit to/from stand   Upper Body Dressing : Set up;Sitting   Lower Body Dressing: Minimal assistance;Sit to/from stand   Toilet Transfer: Min guard;RW;Ambulation   Toileting- Water quality scientist and Hygiene: Supervision/safety;Sit to/from stand   Tub/ Shower Transfer: Walk-in shower;Min guard;Shower seat;Ambulation     General ADL Comments: pt has a reacher and long sponge. She will have family assist with TED hose and wasn't interested in sock aide. Educated on sidestepping through tight space in bathroom, shower seat and clamp on bar placement as well as rigging up shower curtain so that door can remain open as it swings out     Vision         Perception     Praxis       Pertinent Vitals/Pain Pain Assessment: 0-10 Pain Score: 3  Pain Location: R hip Pain Descriptors / Indicators: Aching;Sore Pain Intervention(s): Limited activity within patient's tolerance;Monitored during session;Premedicated before session;Ice applied     Hand Dominance     Extremity/Trunk Assessment Upper Extremity Assessment Upper Extremity Assessment: Overall WFL for tasks assessed           Communication Communication Communication: No difficulties   Cognition Arousal/Alertness: Awake/alert Behavior During Therapy: WFL for tasks assessed/performed Overall Cognitive Status: Within Functional Limits for tasks assessed                     General Comments       Exercises       Shoulder Instructions      Home Living Family/patient expects to be discharged to:: Private residence Living Arrangements: Alone Available Help at Discharge: Family               Bathroom Shower/Tub: Walk-in Corporate treasurer Toilet: Standard     Home Equipment: Bedside commode;Shower seat   Additional Comments: sister and brother in law will be staying with her for as long as needed      Prior Functioning/Environment Level of Independence: Independent  OT Problem List:        OT Treatment/Interventions:      OT Goals(Current goals can be found in the care plan section) Acute Rehab OT Goals Patient Stated Goal: Regain IND OT Goal Formulation: All assessment and education complete, DC therapy  OT Frequency:     Barriers to D/C:            Co-evaluation              End of Session    Activity Tolerance: Patient tolerated treatment well Patient left: in chair;with call bell/phone within reach  OT Visit Diagnosis: Pain Pain - Right/Left: Right Pain - part of body: Hip                ADL either performed or assessed with clinical judgement  Time: 1045-1110 OT Time Calculation (min): 25 min Charges:  OT General  Charges $OT Visit: 1 Procedure OT Evaluation $OT Eval Low Complexity: 1 Procedure G-Codes:     [place], OTR/L [phone] 05/05/2016  Erika Ramsey 05/05/2016, 11:20 AM

## 2016-05-05 NOTE — Progress Notes (Signed)
Physical Therapy Treatment Patient Details Name: Erika Ramsey MRN: [id number] DOB: December 01, 1945 Today's Date: 05/05/2016    History of Present Illness Pt s/p R THR    PT Comments    Pt progressing well with mobility and eager for dc home.  Reviewed stairs and car transfers with pt and family.   Follow Up Recommendations  Home health PT     Equipment Recommendations  None recommended by PT    Recommendations for Other Services OT consult     Precautions / Restrictions Precautions Precautions: Fall Restrictions Weight Bearing Restrictions: No Other Position/Activity Restrictions: WBAT    Mobility  Bed Mobility                  Transfers Overall transfer level: Needs assistance Equipment used: Rolling walker (2 wheeled) Transfers: Sit to/from Stand Sit to Stand: Supervision         General transfer comment: cues for LE management and use of UEs to self assist  Ambulation/Gait Ambulation/Gait assistance: Min guard;Supervision Ambulation Distance (Feet): 300 Feet Assistive device: Rolling walker (2 wheeled) Gait Pattern/deviations: Step-to pattern;Decreased step length - right;Decreased step length - left;Shuffle;Trunk flexed Gait velocity: decr Gait velocity interpretation: Below normal speed for age/gender General Gait Details: min cues for posture, sequence and position from RW   Stairs Stairs: Yes   Stair Management: No rails;Step to pattern;Forwards;With walker Number of Stairs: 6 General stair comments: 2 stairs 3 times with cues for sequence and foot/RW placement.  Sister and brother-in-law assisting  Wheelchair Mobility    Modified Rankin (Stroke Patients Only)       Balance                                    Cognition Arousal/Alertness: Awake/alert Behavior During Therapy: WFL for tasks assessed/performed Overall Cognitive Status: Within Functional Limits for tasks assessed                      Exercises       General Comments        Pertinent Vitals/Pain Pain Assessment: 0-10 Pain Score: 3  Pain Location: R hip Pain Descriptors / Indicators: Aching;Sore Pain Intervention(s): Limited activity within patient's tolerance;Monitored during session;Premedicated before session;Ice applied    Home Living                      Prior Function            PT Goals (current goals can now be found in the care plan section) Acute Rehab PT Goals Patient Stated Goal: Regain IND PT Goal Formulation: With patient Time For Goal Achievement: 05/07/16 Potential to Achieve Goals: Good Progress towards PT goals: Progressing toward goals    Frequency    7X/week      PT Plan Current plan remains appropriate    Co-evaluation             End of Session Equipment Utilized During Treatment: Gait belt Activity Tolerance: Patient tolerated treatment well Patient left: in chair;with call bell/phone within reach;with chair alarm set Nurse Communication: Mobility status PT Visit Diagnosis: Difficulty in walking, not elsewhere classified (R26.2)     Time: 1322-1410 PT Time Calculation (min) (ACUTE ONLY): 48 min  Charges:  $Gait Training: 23-37 mins $Therapeutic Activity: 8-22 mins  G Codes:       Sargent Mankey 05/30/16, 4:58 PM

## 2016-05-05 NOTE — Discharge Summary (Signed)
Physician Discharge Summary   Patient ID: IMARI REEN MRN: [id number] DOB/AGE: 08-29-1945 71 y.o.  Admit date: 05/04/2016 Discharge date: 05/05/2016  Primary Diagnosis:  Osteoarthritis of the Right hip.   Admission Diagnoses:  Past Medical History:  Diagnosis Date  . Atrophic vaginitis   . Breast cyst   . 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 Decreased libido   . Hypertension   . Osteopenia 02/2012   T score -1.6 FRAX 9.6%/ 1.3%   Discharge Diagnoses:   Principal Problem:   OA (osteoarthritis) of hip  Estimated body mass index is 23.21 kg/m as calculated from the following:   Height as of this encounter: 5' 3"  (1.6 m).   Weight as of this encounter: 59.4 kg (131 lb).  Procedure(s) (LRB): RIGHT TOTAL HIP ARTHROPLASTY ANTERIOR APPROACH (Right)   Consults: None  HPI: Erika Ramsey is a 71 y.o. female who has advanced end-  stage arthritis of their Right  hip with progressively worsening pain and  dysfunction.The patient has failed nonoperative management and presents for  total hip arthroplasty.   Laboratory Data: Admission on 05/04/2016  Component Date Value Ref Range Status  . WBC 05/05/2016 11.7* 4.0 - 10.5 K/uL Final  . RBC 05/05/2016 3.14* 3.87 - 5.11 MIL/uL Final  . Hemoglobin 05/05/2016 10.0* 12.0 - 15.0 g/dL Final  . HCT 05/05/2016 28.9* 36.0 - 46.0 % Final  . MCV 05/05/2016 92.0  78.0 - 100.0 fL Final  . MCH 05/05/2016 31.8  26.0 - 34.0 pg Final  . MCHC 05/05/2016 34.6  30.0 - 36.0 g/dL Final  . RDW 05/05/2016 12.4  11.5 - 15.5 % Final  . Platelets 05/05/2016 171  150 - 400 K/uL Final  . Sodium 05/05/2016 137  135 - 145 mmol/L Final  . Potassium 05/05/2016 3.3* 3.5 - 5.1 mmol/L Final  . Chloride 05/05/2016 107  101 - 111 mmol/L Final  . CO2 05/05/2016 24  22 - 32 mmol/L Final  . Glucose, Bld 05/05/2016 114* 65 - 99 mg/dL Final  . BUN 05/05/2016 12  6 - 20 mg/dL Final  . Creatinine, Ser 05/05/2016 0.75  0.44 - 1.00 mg/dL Final  . Calcium 05/05/2016 7.8*  8.9 - 10.3 mg/dL Final  . GFR calc non Af Amer 05/05/2016 >60  >60 mL/min Final  . GFR calc Af Amer 05/05/2016 >60  >60 mL/min Final   Comment: (NOTE) The eGFR has been calculated using the CKD EPI equation. This calculation has not been validated in all clinical situations. eGFR's persistently <60 mL/min signify possible Chronic Kidney Disease.   Georgiann Hahn gap 05/05/2016 6  5 - 15 Final  Hospital Outpatient Visit on 04/28/2016  Component Date Value Ref Range Status  . aPTT 04/28/2016 34  24 - 36 seconds Final  . WBC 04/28/2016 8.6  4.0 - 10.5 K/uL Final  . RBC 04/28/2016 4.45  3.87 - 5.11 MIL/uL Final  . Hemoglobin 04/28/2016 14.2  12.0 - 15.0 g/dL Final  . HCT 04/28/2016 41.1  36.0 - 46.0 % Final  . MCV 04/28/2016 92.4  78.0 - 100.0 fL Final  . MCH 04/28/2016 31.9  26.0 - 34.0 pg Final  . MCHC 04/28/2016 34.5  30.0 - 36.0 g/dL Final  . RDW 04/28/2016 12.3  11.5 - 15.5 % Final  . Platelets 04/28/2016 233  150 - 400 K/uL Final  . Sodium 04/28/2016 139  135 - 145 mmol/L Final  . Potassium 04/28/2016 4.3  3.5 - 5.1 mmol/L Final  .  Chloride 04/28/2016 105  101 - 111 mmol/L Final  . CO2 04/28/2016 27  22 - 32 mmol/L Final  . Glucose, Bld 04/28/2016 95  65 - 99 mg/dL Final  . BUN 04/28/2016 19  6 - 20 mg/dL Final  . Creatinine, Ser 04/28/2016 0.91  0.44 - 1.00 mg/dL Final  . Calcium 04/28/2016 9.5  8.9 - 10.3 mg/dL Final  . Total Protein 04/28/2016 7.0  6.5 - 8.1 g/dL Final  . Albumin 04/28/2016 4.0  3.5 - 5.0 g/dL Final  . AST 04/28/2016 25  15 - 41 U/L Final  . ALT 04/28/2016 19  14 - 54 U/L Final  . Alkaline Phosphatase 04/28/2016 52  38 - 126 U/L Final  . Total Bilirubin 04/28/2016 0.7  0.3 - 1.2 mg/dL Final  . GFR calc non Af Amer 04/28/2016 >60  >60 mL/min Final  . GFR calc Af Amer 04/28/2016 >60  >60 mL/min Final   Comment: (NOTE) The eGFR has been calculated using the CKD EPI equation. This calculation has not been validated in all clinical situations. eGFR's persistently  <60 mL/min signify possible Chronic Kidney Disease.   . Anion gap 04/28/2016 7  5 - 15 Final  . Prothrombin Time 04/28/2016 12.5  11.4 - 15.2 seconds Final  . INR 04/28/2016 0.93   Final  . ABO/RH(D) 04/28/2016 A NEG   Final  . Antibody Screen 04/28/2016 NEG   Final  . Sample Expiration 04/28/2016 05/08/2016   Final  . Extend sample reason 04/28/2016 NO TRANSFUSIONS OR PREGNANCY IN THE PAST 3 MONTHS   Final  . MRSA, PCR 04/28/2016 NEGATIVE  NEGATIVE Final  . Staphylococcus aureus 04/28/2016 NEGATIVE  NEGATIVE Final   Comment:        The Xpert SA Assay (FDA approved for NASAL specimens in patients over 31 years of age), is one component of a comprehensive surveillance program.  Test performance has been validated by [facility] for patients greater than or equal to 81 year old. It is not intended to diagnose infection nor to guide or monitor treatment.   . ABO/RH(D) 04/28/2016 A NEG   Final  Office Visit on 04/04/2016  Component Date Value Ref Range Status  . Specimen adequacy: 04/04/2016 SEE NOTE   Final  . FINAL DIAGNOSIS: 04/04/2016 SEE NOTE   Final   Comment: - NEGATIVE FOR INTRAEPITHELIAL LESIONS OR MALIGNANCY.   Marland Kitchen COMMENTS: 04/04/2016 SEE NOTE   Final  . Cytotechnologist: 04/04/2016 SEE NOTE   Final   Comment: [facility], BS CT(ASCP) *  The Pap is a screening test for cervical cancer. It is not a  diagnostic test and is subject to false negative and false positive  results. It is most reliable when a satisfactory sample, regularly  obtained, is submitted with relevant clinical findings and history,  and when the Pap result is evaluated along with historic and current  clinical information.      X-Rays:Dg Pelvis Portable  Result Date: 05/04/2016 CLINICAL DATA:  71 year old female status post hip replacement. Initial encounter. EXAM: PORTABLE PELVIS 1-2 VIEWS COMPARISON:  Right hip series 4 2017. FINDINGS: Portable AP supine view at 1016 hours. Sequelae of right bipolar  type hip arthroplasty placement. Hardware appears intact and normally aligned in the AP view. Postoperative drain in place. No unexpected osseous changes identified. IMPRESSION: Right bipolar hip arthroplasty with no adverse features. Electronically Signed   By: Genevie Ann M.D.   On: 05/04/2016 10:31   Dg C-arm 1-60 Min-no Report  Result Date:  05/04/2016 Fluoroscopy was utilized by the requesting physician.  No radiographic interpretation.    EKG: Orders placed or performed in visit on 06/25/15  . EKG 12-Lead     Hospital Course: Patient was admitted to [facility] and taken to the OR and underwent the above state procedure without complications.  Patient tolerated the procedure well and was later transferred to the recovery room and then to the orthopaedic floor for postoperative care.  They were given PO and IV analgesics for pain control following their surgery.  They were given 24 hours of postoperative antibiotics of  Anti-infectives    Start     Dose/Rate Route Frequency Ordered Stop   05/04/16 1600  ceFAZolin (ANCEF) IVPB 2g/100 mL premix     2 g 200 mL/hr over 30 Minutes Intravenous Every 6 hours 05/04/16 1138 05/04/16 2203   05/04/16 0603  ceFAZolin (ANCEF) IVPB 2g/100 mL premix     2 g 200 mL/hr over 30 Minutes Intravenous On call to O.R. 05/04/16 0160 05/04/16 1093     and started on DVT prophylaxis in the form of Xarelto.   PT and OT were ordered for total hip protocol.  The patient was allowed to be WBAT with therapy. Discharge planning was consulted to help with postop disposition and equipment needs.  Patient had a decent night on the evening of surgery.  They started to get up OOB with therapy on day one.  Hemovac drain was pulled without difficulty.  Dressing was checked and was clean and dry. Patient was seen in rounds on day one and was setup and after meeting therapy goals, was ready to go home.  Discharge home with home health Diet - Cardiac diet Follow up - in 2  weeks Activity - WBAT Disposition - Home Condition Upon Discharge - improving D/C Meds - See DC Summary DVT Prophylaxis - Xarelto   Discharge Instructions    Call MD / Call 911    Complete by:  As directed    If you experience chest pain or shortness of breath, CALL 911 and be transported to the hospital emergency room.  If you develope a fever above 101 F, pus (white drainage) or increased drainage or redness at the wound, or calf pain, call your surgeon's office.   Change dressing    Complete by:  As directed    You may change your dressing dressing daily with sterile 4 x 4 inch gauze dressing and paper tape.  Do not submerge the incision under water.   Constipation Prevention    Complete by:  As directed    Drink plenty of fluids.  Prune juice may be helpful.  You may use a stool softener, such as Colace (over the counter) 100 mg twice a day.  Use MiraLax (over the counter) for constipation as needed.   Diet - low sodium heart healthy    Complete by:  As directed    Discharge instructions    Complete by:  As directed    Pick up stool softner and laxative for home use following surgery while on pain medications. Do not submerge incision under water. Please use good hand washing techniques while changing dressing each day. May shower starting three days after surgery. Please use a clean towel to pat the incision dry following showers. Continue to use ice for pain and swelling after surgery. Do not use any lotions or creams on the incision until instructed by your surgeon.  Wear both TED hose on both  legs during the day every day for three weeks, but may have off at night at home.  Postoperative Constipation Protocol  Constipation - defined medically as fewer than three stools per week and severe constipation as less than one stool per week.  One of the most common issues patients have following surgery is constipation.  Even if you have a regular bowel pattern at home, your  normal regimen is likely to be disrupted due to multiple reasons following surgery.  Combination of anesthesia, postoperative narcotics, change in appetite and fluid intake all can affect your bowels.  In order to avoid complications following surgery, here are some recommendations in order to help you during your recovery period.  Colace (docusate) - Pick up an over-the-counter form of Colace or another stool softener and take twice a day as long as you are requiring postoperative pain medications.  Take with a full glass of water daily.  If you experience loose stools or diarrhea, hold the colace until you stool forms back up.  If your symptoms do not get better within 1 week or if they get worse, check with your doctor.  Dulcolax (bisacodyl) - Pick up over-the-counter and take as directed by the product packaging as needed to assist with the movement of your bowels.  Take with a full glass of water.  Use this product as needed if not relieved by Colace only.   MiraLax (polyethylene glycol) - Pick up over-the-counter to have on hand.  MiraLax is a solution that will increase the amount of water in your bowels to assist with bowel movements.  Take as directed and can mix with a glass of water, juice, soda, coffee, or tea.  Take if you go more than two days without a movement. Do not use MiraLax more than once per day. Call your doctor if you are still constipated or irregular after using this medication for 7 days in a row.  If you continue to have problems with postoperative constipation, please contact the office for further assistance and recommendations.  If you experience "the worst abdominal pain ever" or develop nausea or vomiting, please contact the office immediatly for further recommendations for treatment.   Take Xarelto for two and a half more weeks, then discontinue Xarelto. Once the patient has completed the Xarelto, they may resume the 81 mg Aspirin.   Do not sit on low chairs, stoools  or toilet seats, as it may be difficult to get up from low surfaces    Complete by:  As directed    Driving restrictions    Complete by:  As directed    No driving until released by the physician.   Increase activity slowly as tolerated    Complete by:  As directed    Lifting restrictions    Complete by:  As directed    No lifting until released by the physician.   Patient may shower    Complete by:  As directed    You may shower without a dressing once there is no drainage.  Do not wash over the wound.  If drainage remains, do not shower until drainage stops.   TED hose    Complete by:  As directed    Use stockings (TED hose) for 3 weeks on both leg(s).  You may remove them at night for sleeping.   Weight bearing as tolerated    Complete by:  As directed      Allergies as of 05/05/2016  Reactions   Pollen Extract    Tomato    Tongue swells      Medication List    STOP taking these medications   aspirin 81 MG tablet   CALCIUM + D PO   FISH OIL + D3 1200-1000 MG-UNIT Caps   multivitamin capsule     TAKE these medications   ALPRAZolam 0.25 MG tablet Commonly known as:  XANAX Take 1 tablet (0.25 mg total) by mouth daily. What changed:  how much to take   amLODipine 5 MG tablet Commonly known as:  NORVASC Take 1 tablet (5 mg total) by mouth daily.   CHLORTHALIDONE PO Take 1 tablet by mouth daily.   cloNIDine 0.1 MG tablet Commonly known as:  CATAPRES Take 1 tablet (0.1 mg total) by mouth 2 (two) times daily. As needed for blood pressure > 188 systolic What changed:  when to take this  reasons to take this  additional instructions   losartan 100 MG tablet Commonly known as:  COZAAR Take 1 tablet (100 mg total) by mouth daily.   methocarbamol 500 MG tablet Commonly known as:  ROBAXIN Take 1 tablet (500 mg total) by mouth every 6 (six) hours as needed for muscle spasms.   oxyCODONE 5 MG immediate release tablet Commonly known as:  Oxy  IR/ROXICODONE Take 1-2 tablets (5-10 mg total) by mouth every 4 (four) hours as needed for moderate pain or severe pain.   rivaroxaban 10 MG Tabs tablet Commonly known as:  XARELTO Take 1 tablet (10 mg total) by mouth daily with breakfast. Take Xarelto for two and a half more weeks following discharge from the hospital, then discontinue Xarelto. Once the patient has completed the Xarelto, they may resume the 81 mg Aspirin.   traMADol 50 MG tablet Commonly known as:  ULTRAM Take 1-2 tablets (50-100 mg total) by mouth every 6 (six) hours as needed for moderate pain.      Follow-up Information    Gearlean Alf, MD. Schedule an appointment as soon as possible for a visit on 05/17/2016.   Specialty:  Orthopedic Surgery Contact information: [address] [place] [address] [phone]           Signed: Arlee Muslim, PA-C Orthopaedic Surgery 05/05/2016, 8:12 AM

## 2016-05-05 NOTE — Progress Notes (Signed)
Physical Therapy Treatment Patient Details Name: Erika Ramsey MRN: [id number] DOB: [birth year] Today's Date: 05/05/2016    History of Present Illness Pt s/p R THR    PT Comments    Pt very motivated and progressing well with mobility.   Follow Up Recommendations  Home health PT     Equipment Recommendations  None recommended by PT    Recommendations for Other Services OT consult     Precautions / Restrictions Precautions Precautions: Fall Restrictions Weight Bearing Restrictions: No Other Position/Activity Restrictions: WBAT    Mobility  Bed Mobility               General bed mobility comments: oob  Transfers Overall transfer level: Needs assistance Equipment used: Rolling walker (2 wheeled) Transfers: Sit to/from Stand Sit to Stand: Supervision         General transfer comment: cues for LE management and use of UEs to self assist  Ambulation/Gait Ambulation/Gait assistance: Min assist;Min guard Ambulation Distance (Feet): 200 Feet (and 15' to bathroom) Assistive device: Rolling walker (2 wheeled) Gait Pattern/deviations: Step-to pattern;Decreased step length - right;Decreased step length - left;Shuffle;Trunk flexed Gait velocity: decr Gait velocity interpretation: Below normal speed for age/gender General Gait Details: cues for posture, sequence and position from [employer]            Wheelchair Mobility    Modified Rankin (Stroke Patients Only)       Balance                                    Cognition Arousal/Alertness: Awake/alert Behavior During Therapy: WFL for tasks assessed/performed Overall Cognitive Status: Within Functional Limits for tasks assessed                      Exercises Total Joint Exercises Ankle Circles/Pumps: AROM;Both;20 reps;Supine Quad Sets: AROM;Both;10 reps;Supine Heel Slides: AAROM;Right;20 reps;Supine Hip ABduction/ADduction: AAROM;Right;15 reps;Supine    General  Comments        Pertinent Vitals/Pain Pain Assessment: 0-10 Pain Score: 3  Pain Location: R hip Pain Descriptors / Indicators: Aching;Sore Pain Intervention(s): Limited activity within patient's tolerance;Monitored during session;Premedicated before session;Ice applied    Home Living Family/patient expects to be discharged to:: Private residence Living Arrangements: Alone Available Help at Discharge: Family         Home Equipment: Bedside commode;Shower seat Additional Comments: sister and brother in law will be staying with her for as long as needed    Prior Function Level of Independence: Independent          PT Goals (current goals can now be found in the care plan section) Acute Rehab PT Goals Patient Stated Goal: Regain IND PT Goal Formulation: With patient Time For Goal Achievement: 05/07/16 Potential to Achieve Goals: Good Progress towards PT goals: Progressing toward goals    Frequency    7X/week      PT Plan Current plan remains appropriate    Co-evaluation             End of Session Equipment Utilized During Treatment: Gait belt Activity Tolerance: Patient tolerated treatment well Patient left: in chair;with call bell/phone within reach;with chair alarm set Nurse Communication: Mobility status PT Visit Diagnosis: Difficulty in walking, not elsewhere classified (R26.2)     Time: 2330-0762 PT Time Calculation (min) (ACUTE ONLY): 39 min  Charges:  $Gait Training: 8-22 mins $Therapeutic Exercise: 8-22 mins $Therapeutic  Activity: 8-22 mins                    G Codes:       Erika Ramsey 2016/05/26, 11:24 AM

## 2016-06-16 IMAGING — DX DG LUMBAR SPINE COMPLETE 4+V
5 series · 5 of 5 positions shown · non-contrast
Comparison: None.

CLINICAL DATA: Lumbago

EXAM:
LUMBAR SPINE - COMPLETE 4+ VIEW

[l-spine ap]
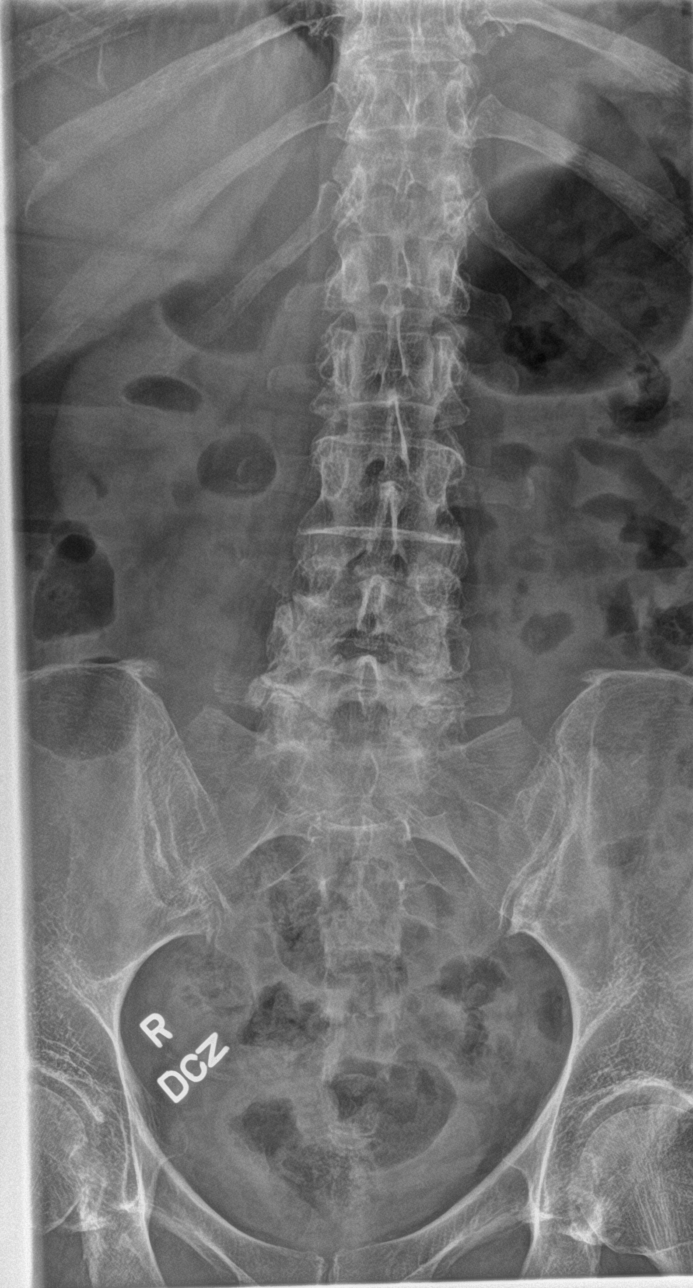

[l-spine obl (1 of 2)]
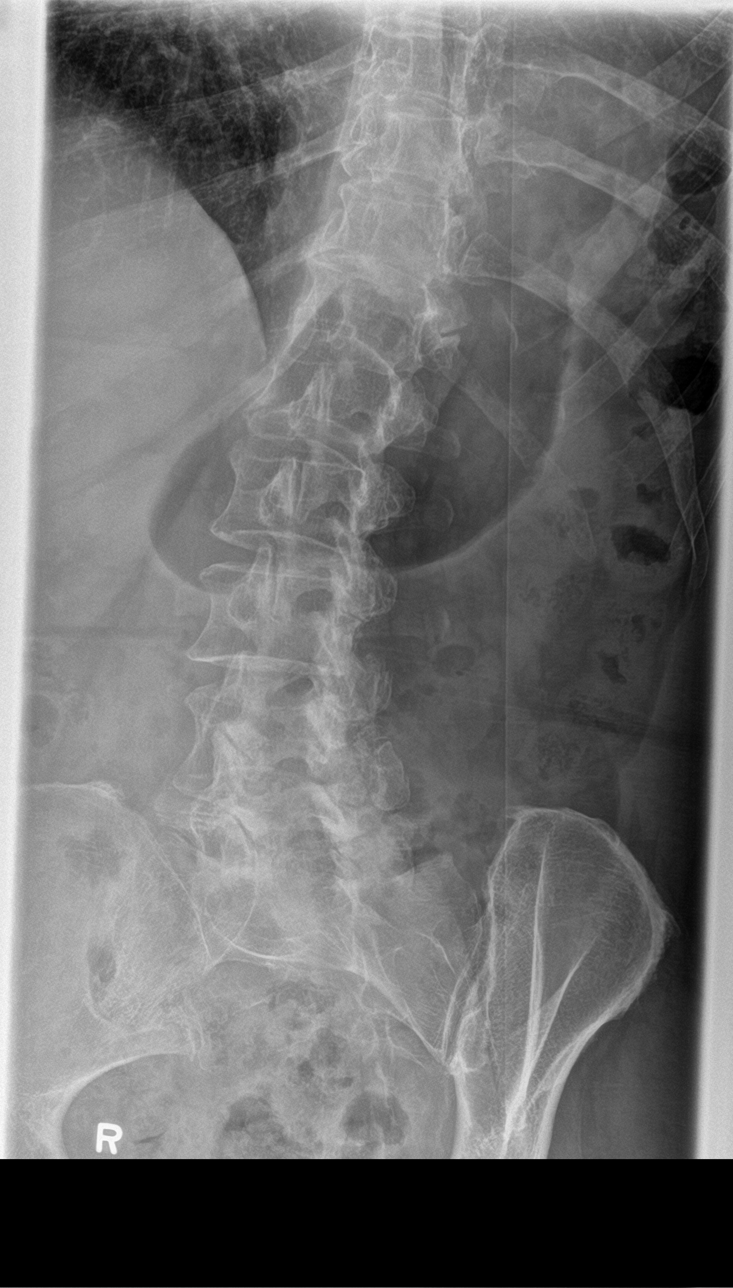

[l-spine obl (2 of 2)]
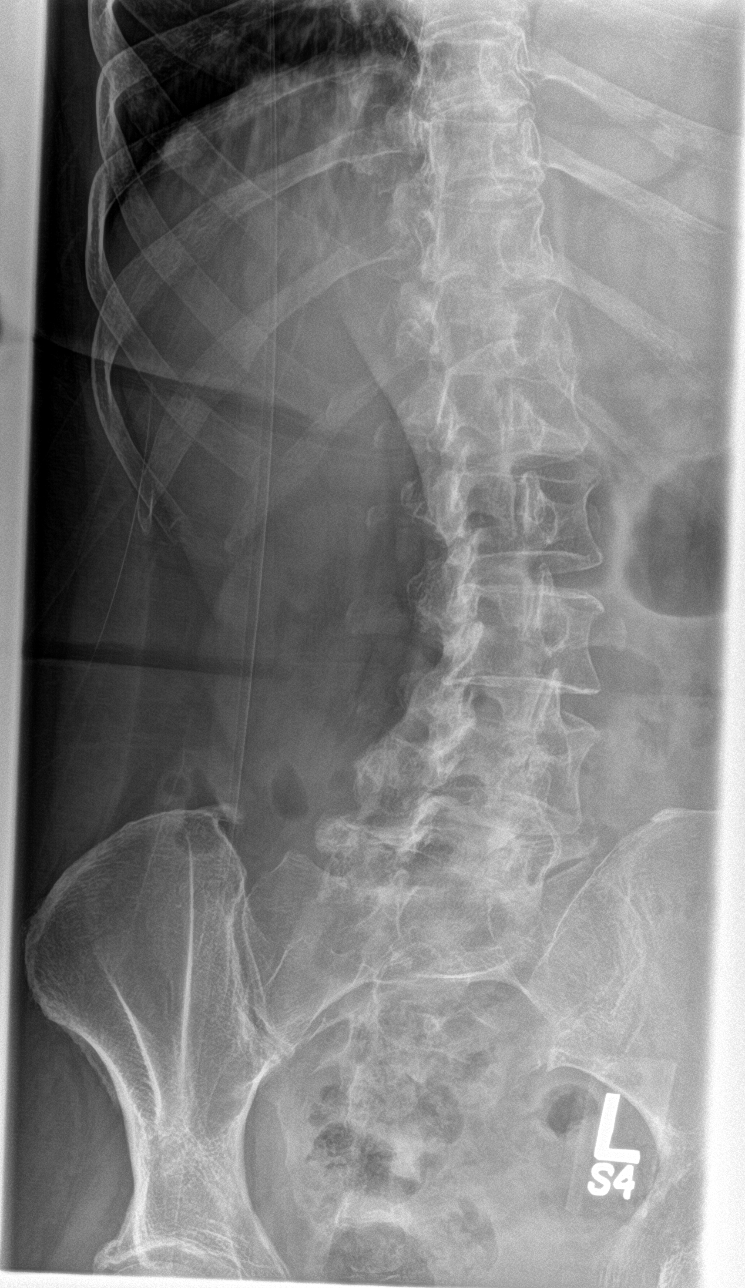

[l-spine lat]
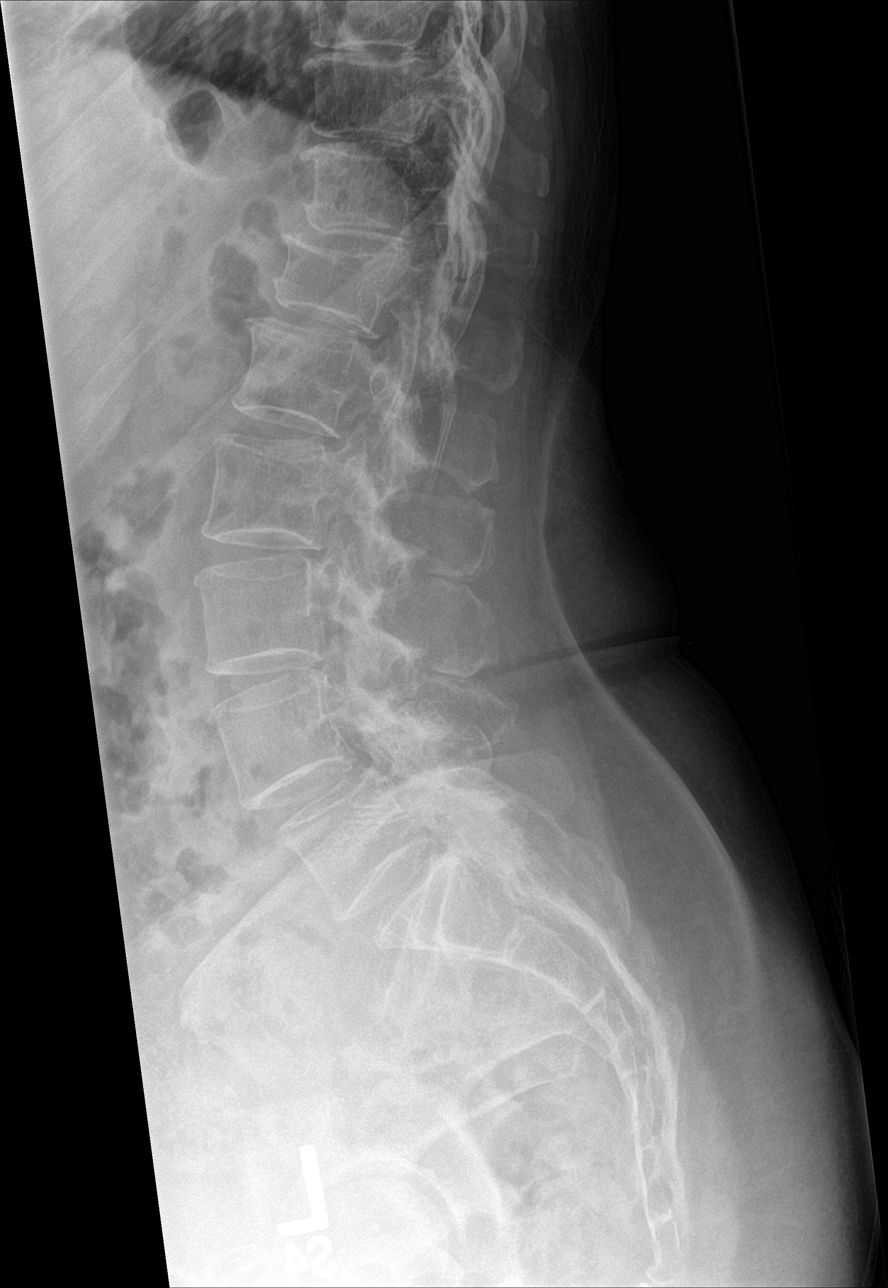

[l-spine spot]
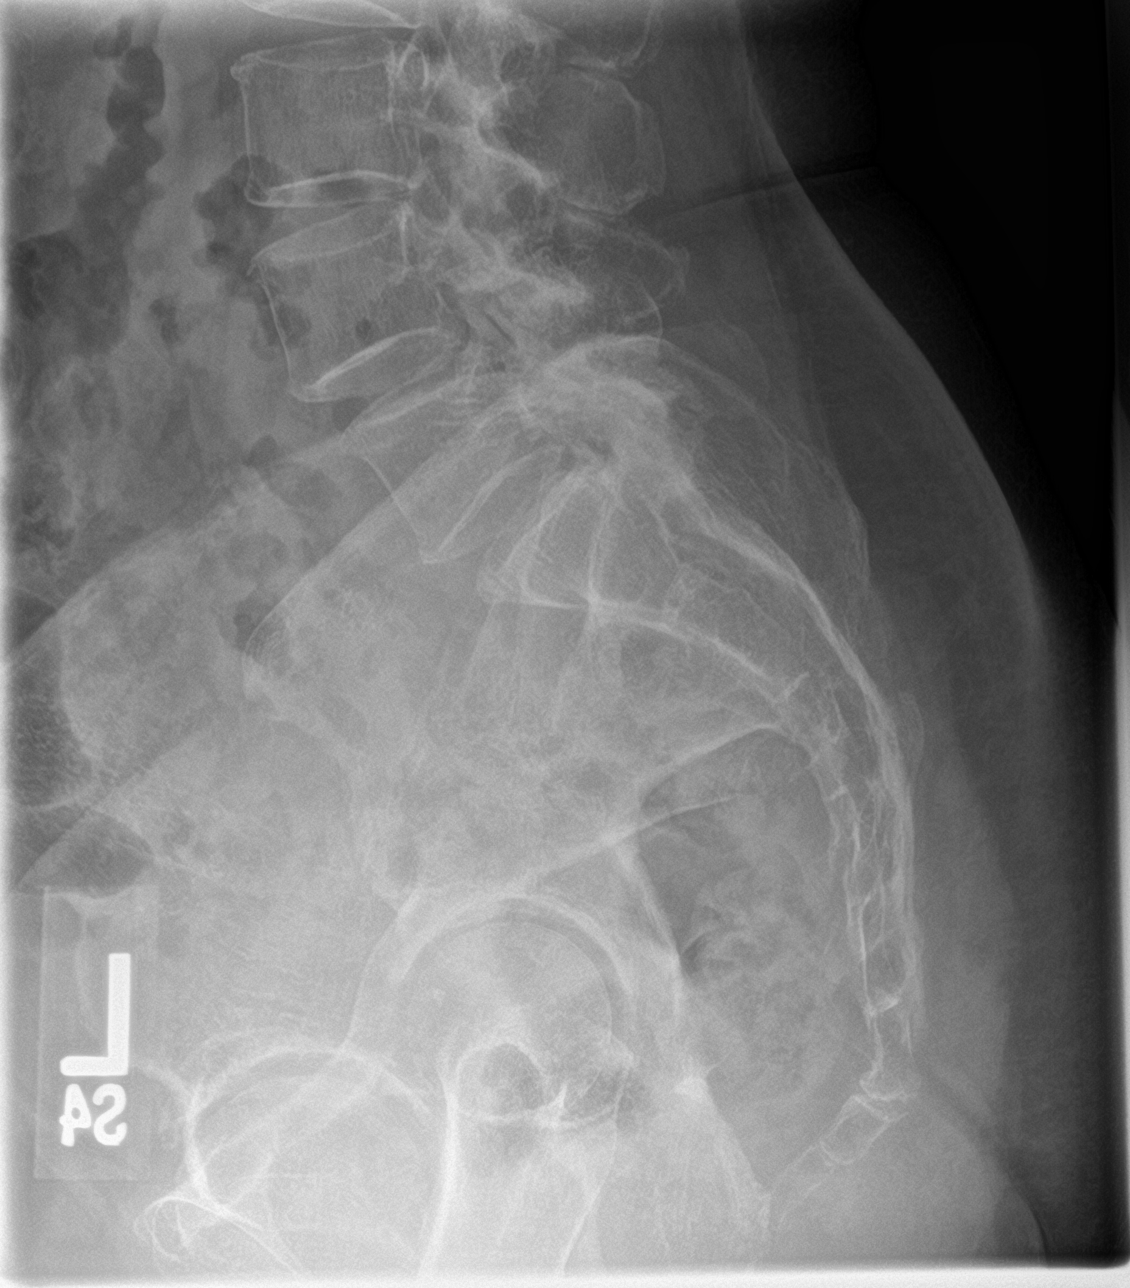

[5 of 5 positions shown; findings below may reference images not displayed]

FINDINGS: Frontal, lateral, spot lumbosacral lateral, and bilateral oblique
views were obtained. There are 5 non-rib-bearing lumbar type
vertebral bodies. There is age uncertain mild anterior wedging of
the T12 vertebral body. There is no other fracture. There is minimal
anterolisthesis of L4 on L5. No other spondylolisthesis is evident.
There is mild disc space narrowing at all levels in the lumbar
region. There is facet osteoarthritic change at L4-5 and L5-S1
bilaterally.
IMPRESSION: Age uncertain anterior wedging of the T12 vertebral body. No other
fracture evident slight spondylolisthesis at L4-5, likely due to
underlying spondylosis. There is osteoarthritic change at several
levels, most notably at L4-5 and L5-S1.

## 2016-06-20 ENCOUNTER — Ambulatory Visit
Admission: RE | Admit: 2016-06-20 | Discharge: 2016-06-20 | Disposition: A | Discharge status: Home / Self Care | Payer: Medicare Other | Source: Ambulatory Visit | Attending: Gynecology | Admitting: Gynecology

## 2016-06-20 DIAGNOSIS — Z1231 Encounter for screening mammogram for malignant neoplasm of breast: Secondary | ICD-10-CM

## 2016-06-29 ENCOUNTER — Other Ambulatory Visit: Payer: Self-pay | Admitting: Family Medicine

## 2016-06-29 DIAGNOSIS — I10 Essential (primary) hypertension: Secondary | ICD-10-CM

## 2016-07-13 ENCOUNTER — Encounter: Payer: Self-pay | Admitting: Gynecology

## 2017-02-28 HISTORY — PX: KNEE SURGERY: SHX244

## 2017-05-19 ENCOUNTER — Other Ambulatory Visit: Payer: Self-pay | Admitting: Women's Health

## 2017-05-19 DIAGNOSIS — Z1231 Encounter for screening mammogram for malignant neoplasm of breast: Secondary | ICD-10-CM

## 2017-05-30 ENCOUNTER — Other Ambulatory Visit: Payer: Self-pay | Admitting: Family Medicine

## 2017-06-01 ENCOUNTER — Other Ambulatory Visit: Payer: Self-pay | Admitting: Family Medicine

## 2017-06-01 DIAGNOSIS — S8991XA Unspecified injury of right lower leg, initial encounter: Secondary | ICD-10-CM

## 2017-06-09 ENCOUNTER — Ambulatory Visit
Admission: RE | Admit: 2017-06-09 | Discharge: 2017-06-09 | Disposition: A | Discharge status: Home / Self Care | Payer: Medicare Other | Source: Ambulatory Visit | Attending: Family Medicine | Admitting: Family Medicine

## 2017-06-09 DIAGNOSIS — S8991XA Unspecified injury of right lower leg, initial encounter: Secondary | ICD-10-CM

## 2017-06-22 ENCOUNTER — Ambulatory Visit
Admission: RE | Admit: 2017-06-22 | Discharge: 2017-06-22 | Disposition: A | Discharge status: Home / Self Care | Payer: Medicare Other | Source: Ambulatory Visit | Attending: Women's Health | Admitting: Women's Health

## 2017-06-22 DIAGNOSIS — Z1231 Encounter for screening mammogram for malignant neoplasm of breast: Secondary | ICD-10-CM

## 2017-07-04 ENCOUNTER — Encounter (INDEPENDENT_AMBULATORY_CARE_PROVIDER_SITE_OTHER): Payer: Self-pay

## 2017-07-28 ENCOUNTER — Ambulatory Visit: Payer: Medicare Other | Admitting: Obstetrics & Gynecology

## 2017-07-28 ENCOUNTER — Encounter: Payer: Self-pay | Admitting: Obstetrics & Gynecology

## 2017-07-28 VITALS — BP 112/68 | Ht 61.0 in | Wt 131.0 lb

## 2017-07-28 DIAGNOSIS — Z13820 Encounter for screening for osteoporosis: Secondary | ICD-10-CM | POA: Diagnosis not present

## 2017-07-28 DIAGNOSIS — Z780 Asymptomatic menopausal state: Secondary | ICD-10-CM

## 2017-07-28 DIAGNOSIS — M8589 Other specified disorders of bone density and structure, multiple sites: Secondary | ICD-10-CM | POA: Diagnosis not present

## 2017-07-28 DIAGNOSIS — Z01419 Encounter for gynecological examination (general) (routine) without abnormal findings: Secondary | ICD-10-CM

## 2017-07-28 NOTE — Progress Notes (Signed)
Erika Ramsey Jul 31, 1945 [id number]   History:    72 y.o. G0 Companion living around [place].  RP:  Established patient presenting for annual gyn exam   HPI: Menopause, well without HRT.  No PMB.  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  Rt knee arthroscopy <2 weeks ago.  Episode of Shingles.  No pelvic pain.  Normal vaginal secretions.  Abstinent. H/O CIN 3 Cryotherapy in 1997, paps normal since then. Urine and bowel movements normal.  Breasts normal.  Health labs with family physician.  Past medical history,surgical history, family history and social history were all reviewed and documented in the EPIC chart.  Gynecologic History No LMP recorded. Patient is postmenopausal. Contraception: abstinence and post menopausal status Last Pap: 03/2016. Results were: Negative Last mammogram: 05/2017. Results were: Negative Bone Density: 03/2014 Osteopenia Colonoscopy: 2012  Obstetric History OB History  Gravida Para Term Preterm AB Living  0 0 0 0 0 0  SAB TAB Ectopic Multiple Live Births  0 0 0 0 0     ROS: A ROS was performed and pertinent positives and negatives are included in the history.  GENERAL: No fevers or chills. HEENT: No change in vision, no earache, sore throat or sinus congestion. NECK: No pain or stiffness. CARDIOVASCULAR: No chest pain or pressure. No palpitations. PULMONARY: No shortness of breath, cough or wheeze. GASTROINTESTINAL: No abdominal pain, nausea, vomiting or diarrhea, melena or bright red blood per rectum. GENITOURINARY: No urinary frequency, urgency, hesitancy or dysuria. MUSCULOSKELETAL: No joint or muscle pain, no back pain, no recent trauma. DERMATOLOGIC: No rash, no itching, no lesions. ENDOCRINE: No polyuria, polydipsia, no heat or cold intolerance. No recent change in weight. HEMATOLOGICAL: No anemia or easy bruising or bleeding. NEUROLOGIC: No headache, seizures, numbness, tingling or weakness. PSYCHIATRIC: No depression, no loss of interest in normal activity or  change in sleep pattern.     Exam:   BP 112/68   Ht 5\' 1"  (1.549 m)   Wt 131 lb (59.4 kg)   BMI 24.75 kg/m   Body mass index is 24.75 kg/m.  General appearance : Well developed well nourished female. No acute distress HEENT: Eyes: no retinal hemorrhage or exudates,  Neck supple, trachea midline, no carotid bruits, no thyroidmegaly Lungs: Clear to auscultation, no rhonchi or wheezes, or rib retractions  Heart: Regular rate and rhythm, no murmurs or gallops Breast:Examined in sitting and supine position were symmetrical in appearance, no palpable masses or tenderness,  no skin retraction, no nipple inversion, no nipple discharge, no skin discoloration, no axillary or supraclavicular lymphadenopathy Abdomen: no palpable masses or tenderness, no rebound or guarding Extremities: no edema or skin discoloration or tenderness  Pelvic: Vulva: Normal             Vagina: No gross lesions or discharge  Cervix: No gross lesions or discharge  Uterus  AV, normal size, shape and consistency, non-tender and mobile  Adnexa  Without masses or tenderness  Anus: Normal   Assessment/Plan:  72 y.o. female for annual exam   1. Well female exam with routine gynecological exam Normal gynecologic exam.  Pap negative in February 2018.  May repeat at 3 years per patient given history of CIN-3 treated with cryotherapy in 1997.  Breast exam normal.  Screening mammogram was negative in April 2019.  Health labs with family physician.  2. Menopause present Well on no hormone replacement therapy.  No postmenopausal bleeding.  3. Osteopenia of multiple sites Vitamin D supplements, calcium rich nutrition and  regular weightbearing physical activity recommended.  Patient will follow up here for bone density. - DG Bone Density; Future  4. Screening for osteoporosis As above. - DG Bone Density; Future  Princess Bruins MD, 2:14 PM 07/28/2017

## 2017-07-30 ENCOUNTER — Encounter: Payer: Self-pay | Admitting: Obstetrics & Gynecology

## 2017-07-30 NOTE — Patient Instructions (Signed)
1. Well female exam with routine gynecological exam Normal gynecologic exam.  Pap negative in February 2018.  May repeat at 3 years per patient given history of CIN-3 treated with cryotherapy in 1997.  Breast exam normal.  Screening mammogram was negative in April 2019.  Health labs with family physician.  2. Menopause present Well on no hormone replacement therapy.  No postmenopausal bleeding.  3. Osteopenia of multiple sites Vitamin D supplements, calcium rich nutrition and regular weightbearing physical activity recommended.  Patient will follow up here for bone density. - DG Bone Density; Future  4. Screening for osteoporosis As above. - DG Bone Density; Future  Erika Ramsey, it was a pleasure meeting you today!

## 2017-08-21 ENCOUNTER — Other Ambulatory Visit: Payer: Self-pay | Admitting: Gynecology

## 2017-08-21 ENCOUNTER — Other Ambulatory Visit: Payer: Self-pay | Admitting: Obstetrics & Gynecology

## 2017-08-21 DIAGNOSIS — Z780 Asymptomatic menopausal state: Secondary | ICD-10-CM

## 2017-08-21 DIAGNOSIS — M8589 Other specified disorders of bone density and structure, multiple sites: Secondary | ICD-10-CM

## 2017-08-30 ENCOUNTER — Ambulatory Visit (INDEPENDENT_AMBULATORY_CARE_PROVIDER_SITE_OTHER): Payer: Medicare Other

## 2017-08-30 DIAGNOSIS — M8589 Other specified disorders of bone density and structure, multiple sites: Secondary | ICD-10-CM

## 2017-08-30 DIAGNOSIS — Z780 Asymptomatic menopausal state: Secondary | ICD-10-CM

## 2017-09-01 ENCOUNTER — Encounter: Payer: Self-pay | Admitting: Gynecology

## 2018-12-06 ENCOUNTER — Encounter: Payer: Self-pay | Admitting: Gynecology

## 2018-12-26 ENCOUNTER — Other Ambulatory Visit: Payer: Self-pay | Admitting: Women's Health

## 2018-12-26 DIAGNOSIS — Z1231 Encounter for screening mammogram for malignant neoplasm of breast: Secondary | ICD-10-CM

## 2019-02-15 ENCOUNTER — Other Ambulatory Visit: Payer: Self-pay

## 2019-02-15 ENCOUNTER — Ambulatory Visit
Admission: RE | Admit: 2019-02-15 | Discharge: 2019-02-15 | Disposition: A | Discharge status: Home / Self Care | Payer: Medicare Other | Source: Ambulatory Visit | Attending: Women's Health | Admitting: Women's Health

## 2019-02-15 DIAGNOSIS — Z1231 Encounter for screening mammogram for malignant neoplasm of breast: Secondary | ICD-10-CM

## 2019-03-28 ENCOUNTER — Other Ambulatory Visit: Payer: Self-pay

## 2019-03-29 ENCOUNTER — Encounter: Payer: Medicare PPO | Admitting: Obstetrics & Gynecology

## 2019-04-22 ENCOUNTER — Ambulatory Visit: Payer: Medicare Other

## 2019-06-03 ENCOUNTER — Encounter: Payer: Medicare PPO | Admitting: Obstetrics & Gynecology

## 2019-06-20 ENCOUNTER — Other Ambulatory Visit: Payer: Self-pay

## 2019-06-21 ENCOUNTER — Encounter: Payer: Self-pay | Admitting: Obstetrics & Gynecology

## 2019-06-21 ENCOUNTER — Ambulatory Visit: Payer: Medicare PPO | Admitting: Obstetrics & Gynecology

## 2019-06-21 VITALS — BP 118/78 | Ht 61.0 in | Wt 134.0 lb

## 2019-06-21 DIAGNOSIS — Z01419 Encounter for gynecological examination (general) (routine) without abnormal findings: Secondary | ICD-10-CM | POA: Diagnosis not present

## 2019-06-21 DIAGNOSIS — Z780 Asymptomatic menopausal state: Secondary | ICD-10-CM

## 2019-06-21 DIAGNOSIS — M8589 Other specified disorders of bone density and structure, multiple sites: Secondary | ICD-10-CM

## 2019-06-21 NOTE — Progress Notes (Signed)
Erika Ramsey 1945-10-07 [id number]   History:    74 y.o. G0 Companion living around [place].  RP:  Established patient presenting for annual gyn exam   HPI: Menopause, well without HRT.  No PMB.  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  Rt knee arthroscopy 2 yrs ago.  Episode of Shingles 2 yrs ago.  No pelvic pain.  Normal vaginal secretions.  Abstinent. H/O CIN 3 Cryotherapy in 1997, paps normal since then. Urine and bowel movements normal.  Breasts normal.  Health labs with family physician.   Past medical history,surgical history, family history and social history were all reviewed and documented in the EPIC chart.  Gynecologic History No LMP recorded. Patient is postmenopausal.  Obstetric History OB History  Gravida Para Term Preterm AB Living  0 0 0 0 0 0  SAB TAB Ectopic Multiple Live Births  0 0 0 0 0     ROS: A ROS was performed and pertinent positives and negatives are included in the history.  GENERAL: No fevers or chills. HEENT: No change in vision, no earache, sore throat or sinus congestion. NECK: No pain or stiffness. CARDIOVASCULAR: No chest pain or pressure. No palpitations. PULMONARY: No shortness of breath, cough or wheeze. GASTROINTESTINAL: No abdominal pain, nausea, vomiting or diarrhea, melena or bright red blood per rectum. GENITOURINARY: No urinary frequency, urgency, hesitancy or dysuria. MUSCULOSKELETAL: No joint or muscle pain, no back pain, no recent trauma. DERMATOLOGIC: No rash, no itching, no lesions. ENDOCRINE: No polyuria, polydipsia, no heat or cold intolerance. No recent change in weight. HEMATOLOGICAL: No anemia or easy bruising or bleeding. NEUROLOGIC: No headache, seizures, numbness, tingling or weakness. PSYCHIATRIC: No depression, no loss of interest in normal activity or change in sleep pattern.     Exam:   BP 118/78 (BP Location: Right Arm, Patient Position: Sitting, Cuff Size: Normal)   Ht 5\' 1"  (1.549 m)   Wt 134 lb (60.8 kg)   BMI  25.32 kg/m   Body mass index is 25.32 kg/m.  General appearance : Well developed well nourished female. No acute distress HEENT: Eyes: no retinal hemorrhage or exudates,  Neck supple, trachea midline, no carotid bruits, no thyroidmegaly Lungs: Clear to auscultation, no rhonchi or wheezes, or rib retractions  Heart: Regular rate and rhythm, no murmurs or gallops Breast:Examined in sitting and supine position were symmetrical in appearance, no palpable masses or tenderness,  no skin retraction, no nipple inversion, no nipple discharge, no skin discoloration, no axillary or supraclavicular lymphadenopathy Abdomen: no palpable masses or tenderness, no rebound or guarding Extremities: no edema or skin discoloration or tenderness  Pelvic: Vulva: Normal             Vagina: No gross lesions or discharge  Cervix: No gross lesions or discharge.  Pap reflex done.   Uterus  AV, normal size, shape and consistency, non-tender and mobile  Adnexa  Without masses or tenderness  Anus: Normal   Assessment/Plan:  74 y.o. female for annual exam   1. Well female exam with routine gynecological exam Normal gynecologic exam.  Pap reflex done.  Breast exam normal.  Screening mammogram December 2020 was negative.  Colonoscopy 2021, 2 polyps removed 1 of which was dysplastic.  Father with history of colon cancer.  Health labs with family physician.  Good body mass index at 25.32.  Continue with fitness and healthy nutrition. - Pap IG w/ reflex to HPV when ASC-U  2. Postmenopause Well on no hormone replacement therapy.  No  postmenopausal bleeding.  3. Osteopenia of multiple sites Very mild osteopenia on bone density July 2019, only at the left femoral neck with a T score of -1.6.  All other sites were normal.  We will repeat a bone density at 3 years July 2022.  Continue with vitamin D supplements, calcium intake of 1200 mg daily and regular weightbearing physical activities.  Princess Bruins MD, 3:52 PM  06/21/2019

## 2019-06-21 NOTE — Patient Instructions (Signed)
1. Well female exam with routine gynecological exam Normal gynecologic exam.  Pap reflex done.  Breast exam normal.  Screening mammogram December 2020 was negative.  Colonoscopy 2021, 2 polyps removed 1 of which was dysplastic.  Father with history of colon cancer.  Health labs with family physician.  Good body mass index at 25.32.  Continue with fitness and healthy nutrition. - Pap IG w/ reflex to HPV when ASC-U  2. Postmenopause Well on no hormone replacement therapy.  No postmenopausal bleeding.  3. Osteopenia of multiple sites Very mild osteopenia on bone density July 2019, only at the left femoral neck with a T score of -1.6.  All other sites were normal.  We will repeat a bone density at 3 years July 2022.  Continue with vitamin D supplements, calcium intake of 1200 mg daily and regular weightbearing physical activities.  Erika Ramsey, it was a pleasure seeing you today!  I will inform you of your results as soon as they are available.

## 2019-06-24 LAB — PAP IG W/ RFLX HPV ASCU

## 2019-07-01 ENCOUNTER — Encounter: Payer: Self-pay | Admitting: *Deleted

## 2020-01-06 ENCOUNTER — Encounter: Payer: Self-pay | Admitting: Allergy

## 2020-01-06 ENCOUNTER — Other Ambulatory Visit: Payer: Self-pay

## 2020-01-06 ENCOUNTER — Ambulatory Visit: Payer: Medicare PPO | Admitting: Allergy

## 2020-01-06 VITALS — BP 128/72 | HR 60 | Temp 98.1°F | Resp 16 | Ht 62.0 in | Wt 130.4 lb

## 2020-01-06 DIAGNOSIS — L259 Unspecified contact dermatitis, unspecified cause: Secondary | ICD-10-CM | POA: Diagnosis not present

## 2020-01-06 NOTE — Patient Instructions (Signed)
.   Patches placed today. . Please avoid strenuous physical activities and do not get the patches on the back wet. No showering until final patch reading done. Erika Ramsey to take antihistamines for itching but avoid placing any creams on the back where the patches are. . We will remove the patches on Wednesday and will do our initial read. . Then you will come back on Friday for a final read.

## 2020-01-06 NOTE — Progress Notes (Signed)
New Patient Note  RE: Erika Ramsey MRN: [id number] DOB: 11-Dec-1945 Date of Office Visit: 01/06/2020  Referring provider: [name], MD Primary care provider: [name], MD  Chief Complaint: Patch Testing  History of Present Illness: I had the pleasure of seeing Erika Ramsey for initial evaluation at the [facility] on 01/06/2020. She is a 74 y.o. female, who is referred here by Dr. Wynelle Link (orthopedics) for the evaluation of nickel allergy.  Patient is scheduled to have her first right knee replacement on January 27, 2020.  Patient had right hip replacement done in 2018 with no issues.  Nickel containing earrings would cause itchy/red rashes on her earlobes in the past.  Similar issues with nickel rings.   No issues with silver or gold jewelry.  Assessment and Plan: Erika Ramsey is a 74 y.o. female with: Contact dermatitis Concern for nickel allergy as had issues with earrings and rings. Scheduled to have right knee replacement later this month. Had right hip replaced in 2018 with no issues.   Metal patches placed today.  Return Wednesday and Friday for patch reading.  Discussed with patient that it is reassuring that she had no issues with her prior joint replacement.  Return in about 2 days (around 01/08/2020) for Patch reading.  Other allergy screening: Asthma: no Rhino conjunctivitis: yes  Mild rhinitis symptoms in the spring but does not take any medications for this.  Food allergy: yes  Eating too many tomatoes cause swelling in the arms and face but tolerates limited amounts. This has not happened since the 1980s. 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   Chronic low back pain 06/18/2015   History of cervical dysplasia 08/07/2012   Osteopenia    Atrophic vaginitis    Hypertension    Past Medical History:  Diagnosis Date   Atrophic vaginitis    Breast cyst    Cervical dysplasia 1977   CIN 3   Decreased libido    Hypertension    Osteopenia 08/2017   T score -1.6 FRAX 11% / 2% stable from prior DEXA   Shingles    Past Surgical History: Past Surgical History:  Procedure Laterality Date   BREAST CYST ASPIRATION     COLPOSCOPY  1977   CIN 3   HAND SURGERY     KNEE SURGERY  2019   PELVIC LAPAROSCOPY     Diag Lap   TOTAL HIP ARTHROPLASTY Right 05/04/2016   Procedure: RIGHT TOTAL HIP ARTHROPLASTY ANTERIOR APPROACH;  Surgeon: [name], MD;  Location: [facility];  Service: Orthopedics;  Laterality: Right;   Medication List:  Current Outpatient Medications  Medication Sig Dispense Refill   amLODipine (NORVASC) 5 MG tablet Take 1 tablet (5 mg total) by mouth daily. 30 tablet 0   Calcium Carbonate-Vit D-Min (CALCIUM 1200 PO) Take by mouth.     CHLORTHALIDONE PO Take 1 tablet by mouth daily.     glucosamine-chondroitin 500-400 MG tablet Take 1 tablet by mouth 3 (three) times daily.     losartan (COZAAR) 100 MG tablet Take 1 tablet (100 mg total) by mouth daily. 90 tablet 3   Multiple Vitamin (MULTIVITAMIN) tablet Take 1 tablet by mouth daily.     Omega-3 Fatty Acids (  FISH OIL) 1000 MG CAPS Take by mouth.     No current facility-administered medications for this visit.   Allergies: Allergies  Allergen Reactions   Pollen Extract    Tomato     Tongue swells   Social History: Social History   Socioeconomic History   Marital status: Single    Spouse name: Not on file   Number of children: Not on file   Years of education: Not on file   Highest education level: Not on file  Occupational History   Not on file  Tobacco Use   Smoking status: Former Smoker    Quit date: 1979     Years since quitting: 42.8   Smokeless tobacco: Never Used  Scientific laboratory technician Use: Never used  Substance and Sexual Activity   Alcohol use: Yes    Alcohol/week: 7.0 - 8.0 standard drinks    Types: 1 - 2 Cans of beer, 6 Standard drinks or equivalent per week   Drug use: No   Sexual activity: Not Currently    Partners: Male    Birth control/protection: Post-menopausal    Comment: intercourse age 42, sexual partners less than 5  Other Topics Concern   Not on file  Social History Narrative   Not on file   Social Determinants of Health   Financial Resource Strain:    Difficulty of Paying Living Expenses: Not on file  Food Insecurity:    Worried About Charity fundraiser in the Last Year: Not on file   [employer] of Food in the Last Year: Not on file  Transportation Needs:    Lack of Transportation (Medical): Not on file   Lack of Transportation (Non-Medical): Not on file  Physical Activity:    Days of Exercise per Week: Not on file   Minutes of Exercise per Session: Not on file  Stress:    Feeling of Stress : Not on file  Social Connections:    Frequency of Communication with Friends and Family: Not on file   Frequency of Social Gatherings with Friends and Family: Not on file   Attends Religious Services: Not on file   Active Member of Clubs or Organizations: Not on file   Attends Archivist Meetings: Not on file   Marital Status: Not on file   Lives in a house which is 100 year ols. Smoking: denies Occupation: retired  Programme researcher, broadcasting/film/video HistoryFreight forwarder in the house: no Charity fundraiser in the family room: no Carpet in the bedroom: no Heating: gas Cooling: central Pet: yes 1 cat  Family History: Family History  Problem Relation Age of Onset   Hypertension Mother    Heart disease Mother    Cancer Father        Colon cancer   Heart disease Father    Allergic rhinitis Father    Diabetes Maternal Grandmother    Cancer Paternal  Grandfather        Colon cancer   Breast cancer Maternal Aunt        Great aunt 47's   Allergic rhinitis Sister    Angioedema Neg Hx    Asthma Neg Hx    Atopy Neg Hx    Eczema Neg Hx    Immunodeficiency Neg Hx    Urticaria Neg Hx    Review of Systems  Constitutional: Negative for appetite change, chills, fever and unexpected weight change.  HENT: Negative for congestion and rhinorrhea.   Eyes: Negative for itching.  Respiratory: Negative  for cough, chest tightness, shortness of breath and wheezing.   Cardiovascular: Negative for chest pain.  Gastrointestinal: Negative for abdominal pain.  Genitourinary: Negative for difficulty urinating.  Skin: Negative for rash.  Neurological: Negative for headaches.   Objective: BP 128/72    Pulse 60    Temp 98.1 F (36.7 C) (Temporal)    Resp 16    Ht 5\' 2"  (1.575 m)    Wt 130 lb 6.4 oz (59.1 kg)    SpO2 98%    BMI 23.85 kg/m  Body mass index is 23.85 kg/m. Physical Exam Vitals and nursing note reviewed.  Constitutional:      Appearance: Normal appearance. She is well-developed.  HENT:     Head: Normocephalic and atraumatic.     Right Ear: External ear normal. There is impacted cerumen.     Left Ear: External ear normal.     Nose: Nose normal.     Mouth/Throat:     Mouth: Mucous membranes are moist.     Pharynx: Oropharynx is clear.  Eyes:     Conjunctiva/sclera: Conjunctivae normal.  Cardiovascular:     Rate and Rhythm: Normal rate and regular rhythm.     Heart sounds: Normal heart sounds. No murmur heard.  No friction rub. No gallop.   Pulmonary:     Effort: Pulmonary effort is normal.     Breath sounds: Normal breath sounds. No wheezing, rhonchi or rales.  Abdominal:     Palpations: Abdomen is soft.  Musculoskeletal:     Cervical back: Neck supple.  Skin:    General: Skin is warm.     Findings: No rash.  Neurological:     Mental Status: She is alert and oriented to person, place, and time.  Psychiatric:         Behavior: Behavior normal.    The plan was reviewed with the patient/family, and all questions/concerned were addressed.  It was my pleasure to see Erika Ramsey today and participate in her care. Please feel free to contact me with any questions or concerns.  Sincerely,  Rexene Alberts, DO Allergy & Immunology  [facility] office: [place] office: [phone]

## 2020-01-06 NOTE — Assessment & Plan Note (Addendum)
Concern for nickel allergy as had issues with earrings and rings. Scheduled to have right knee replacement later this month. Had right hip replaced in 2018 with no issues.  . Metal patches placed today. . Return Wednesday and Friday for patch reading. . Discussed with patient that it is reassuring that she had no issues with her prior joint replacement.

## 2020-01-07 NOTE — Progress Notes (Signed)
   Follow Up Note  RE: Erika Ramsey MRN: [id number] DOB: 1945-06-30 Date of Office Visit: 01/08/2020  Referring provider: [name], MD Primary care provider: [name], MD  History of Present Illness: I had the pleasure of seeing Minervia Osso for a follow up visit at the [facility] on 01/08/2020. She is a 74 y.o. female, who is being followed for possible contact dermatitis to nickel. Today she is here for initial patch test interpretation, given suspected history of contact dermatitis.   Patient states she got titanium in the hip and there's some type of new composite that they are using for knee replacement as per her surgeon.  Diagnostics:  Metal patch test48 hour reading:   Metals Patch - 01/08/20 1141    Time Antigen Placed 1440    Location Back    Number of Test 11    Reading Interval Day 1;Day 3;Day 5    Select Select    Aluminum Hydroxide 10% 0    Chromium chloride 1% 0    Cobalt chloride hexahydrate 1% 0    Molybdenum chloride 0.5% 0    Nickel sulfate hexahydrate 5% 0    Potassium dichromate 0.25% 0    Copper sulfate pentahydrate 2% 0    Tantal 1% 0    Titanium 0.1% 0    Manganese chloride 0.5% 0    Vanadium Pentoxide 10% 0            Assessment and Plan: Shelene is a 74 y.o. female with: Contact dermatitis Past history - Concern for nickel allergy as had issues with earrings and rings. Scheduled to have right knee replacement later this month. Had right hip replaced in 2018 with no issues.  Interim history - Patient states she got titanium hip replacement and there's some type of new composite that they are using for knee replacement as per her surgeon.  Metal patches removed today. All negative.  . Return Friday for final read.  Return in about 2 days (around 01/10/2020).  It was my pleasure to see Erika Ramsey today and participate in her care. Please feel free to contact me with any questions or  concerns.  Sincerely,  Rexene Alberts, DO Allergy & Immunology  [facility] office: [phone] [facility] office: [place] office: [phone]

## 2020-01-08 ENCOUNTER — Ambulatory Visit: Payer: Medicare PPO | Admitting: Allergy

## 2020-01-08 ENCOUNTER — Other Ambulatory Visit: Payer: Self-pay

## 2020-01-08 ENCOUNTER — Encounter: Payer: Self-pay | Admitting: Allergy

## 2020-01-08 DIAGNOSIS — L259 Unspecified contact dermatitis, unspecified cause: Secondary | ICD-10-CM

## 2020-01-08 NOTE — Assessment & Plan Note (Signed)
Past history - Concern for nickel allergy as had issues with earrings and rings. Scheduled to have right knee replacement later this month. Had right hip replaced in 2018 with no issues.  Interim history - Patient states she got titanium hip replacement and there's some type of new composite that they are using for knee replacement as per her surgeon.  Metal patches removed today. All negative.  . Return Friday for final read.

## 2020-01-10 ENCOUNTER — Encounter: Payer: Self-pay | Admitting: Family

## 2020-01-10 ENCOUNTER — Encounter: Payer: Medicare PPO | Admitting: Family

## 2020-01-10 ENCOUNTER — Other Ambulatory Visit: Payer: Self-pay

## 2020-01-10 ENCOUNTER — Ambulatory Visit: Payer: Medicare PPO | Admitting: Family

## 2020-01-10 DIAGNOSIS — L259 Unspecified contact dermatitis, unspecified cause: Secondary | ICD-10-CM

## 2020-01-10 NOTE — Progress Notes (Signed)
Channah returns to the office today for the final metal patch test interpretation, given suspected history of contact dermatitis. She reports that one ring that she wears on her left hand when it is warm outside causes it to break out. She has had this ring on for the past 2 days and has not had any problems though. She also is able to wear a silver ring and bracelet on her right hand and wrist without any problems. In the past earrings have bothered her. She   had a right hip titanium replacement in the past with no problems.   Diagnostics:   Metal patch test 96-hour hour reading: negative to: chromium chloride 1%, cobalt chloride hexahydrate 1%, molybdenum chloride 0.5%, tantal, nickel sulfate hexahydrate 5%, potassium dichromate 0.25%, copper sulfate pentahydrate 2%, titanium 0.1%, magnese chloride 0.5%, aluminum hydroxide 10%, vanadium pentoxide 10%  Plan:   Allergic contact dermatitis  96 hour metal patch test reading today was all negative.   Thank you for the opportunity to care for [facility].  Please let us know if you have any questions.  Marda Breidenbach,FNP [facility] [place]

## 2020-01-14 NOTE — Patient Instructions (Addendum)
DUE TO COVID-19 ONLY ONE VISITOR IS ALLOWED TO COME WITH YOU AND STAY IN THE WAITING ROOM ONLY DURING PRE OP AND PROCEDURE DAY OF SURGERY. THE 1 VISITOR  MAY VISIT WITH YOU AFTER SURGERY IN YOUR PRIVATE ROOM DURING VISITING HOURS ONLY!  YOU NEED TO HAVE A COVID 19 TEST ON__11/26_____ @_2 :45______, THIS TEST MUST BE DONE BEFORE SURGERY,  COVID TESTING SITE [place] [id number], IT IS ON THE RIGHT GOING OUT [address] APPROXIMATELY  2 MINUTES PAST [employer] ON THE RIGHT. ONCE YOUR COVID TEST IS COMPLETED,  PLEASE BEGIN THE QUARANTINE INSTRUCTIONS AS OUTLINED IN YOUR HANDOUT.                Erie Noe Sinagra    Your procedure is scheduled on:01/27/20    Report to [facility]  Entrance   Report to admitting at   9:05 AM     Call this number if you have problems the morning of surgery [place], NO CHEWING GUM [place].   No food after midnight.    You may have clear liquid until 8:30 AM.    At 8:30 AM drink pre surgery drink  . Nothing by mouth after 8:30 AM.    Take these medicines the morning of surgery with A SIP OF WATER: Amlodipine                                 You may not have any metal on your body including hair pins and              piercings  Do not wear jewelry, make-up, lotions, powders or perfumes, deodorant             Do not wear nail polish on your fingernails.  Do not shave  48 hours prior to surgery.                 Do not bring valuables to the hospital. [place].  Contacts, dentures or bridgework may not be worn into surgery.       Name and phone number of your driver:  Special Instructions: N/A              Please read over the following fact sheets you were given: _____________________________________________________________________             [facility] - Preparing for  Surgery Before surgery, you can play an important role.   Because skin is not sterile, your skin needs to be as free of germs as possible.   You can reduce the number of germs on your skin by washing with CHG (chlorahexidine gluconate) soap before surgery.   CHG is an antiseptic cleaner which kills germs and bonds with the skin to continue killing germs even after washing. Please DO NOT use if you have an allergy to CHG or antibacterial soaps.   If your skin becomes reddened/irritated stop using the CHG and inform your nurse when you arrive at Short Stay. Do not shave (including legs and underarms) for at least 48 hours prior to the first CHG shower.    Please follow these instructions carefully:  1.  Shower with CHG Soap the night  before surgery and the  morning of Surgery.  2.  If you choose to wash your hair, wash your hair first as usual with your  normal  shampoo.  3.  After you shampoo, rinse your hair and body thoroughly to remove the  shampoo.                                        4.  Use CHG as you would any other liquid soap.  You can apply chg directly  to the skin and wash                       Gently with a scrungie or clean washcloth.  5.  Apply the CHG Soap to your body ONLY FROM THE NECK DOWN.   Do not use on face/ open                           Wound or open sores. Avoid contact with eyes, ears mouth and genitals (private parts).                       Wash face,  Genitals (private parts) with your normal soap.             6.  Wash thoroughly, paying special attention to the area where your surgery  will be performed.  7.  Thoroughly rinse your body with warm water from the neck down.  8.  DO NOT shower/wash with your normal soap after using and rinsing off  the CHG Soap.             9.  Pat yourself dry with a clean towel.            10.  Wear clean pajamas.            11.  Place clean sheets on your bed the night of your first shower and do not  sleep with pets. Day of  Surgery : Do not apply any lotions/deodorants the morning of surgery.  Please wear clean clothes to the hospital/surgery center.  FAILURE TO FOLLOW THESE INSTRUCTIONS MAY RESULT IN THE CANCELLATION OF YOUR SURGERY PATIENT SIGNATURE_________________________________  NURSE SIGNATURE__________________________________  ________________________________________________________________________   Adam Phenix  An incentive spirometer is a tool that can help keep your lungs clear and active. This tool measures how well you are filling your lungs with each breath. Taking long deep breaths may help reverse or decrease the chance of developing breathing (pulmonary) problems (especially infection) following:  A long period of time when you are unable to move or be active. BEFORE THE PROCEDURE   If the spirometer includes an indicator to show your best effort, your nurse or respiratory therapist will set it to a desired goal.  If possible, sit up straight or lean slightly forward. Try not to slouch.  Hold the incentive spirometer in an upright position. INSTRUCTIONS FOR USE  1. Sit on the edge of your bed if possible, or sit up as far as you can in bed or on a chair. 2. Hold the incentive spirometer in an upright position. 3. Breathe out normally. 4. Place the mouthpiece in your mouth and seal your lips tightly around it. 5. Breathe in slowly and as deeply as possible, raising the piston or the ball toward the top of  the column. 6. Hold your breath for 3-5 seconds or for as long as possible. Allow the piston or ball to fall to the bottom of the column. 7. Remove the mouthpiece from your mouth and breathe out normally. 8. Rest for a few seconds and repeat Steps 1 through 7 at least 10 times every 1-2 hours when you are awake. Take your time and take a few normal breaths between deep breaths. 9. The spirometer may include an indicator to show your best effort. Use the indicator as a goal to  work toward during each repetition. 10. After each set of 10 deep breaths, practice coughing to be sure your lungs are clear. If you have an incision (the cut made at the time of surgery), support your incision when coughing by placing a pillow or rolled up towels firmly against it. Once you are able to get out of bed, walk around indoors and cough well. You may stop using the incentive spirometer when instructed by your caregiver.  RISKS AND COMPLICATIONS  Take your time so you do not get dizzy or light-headed.  If you are in pain, you may need to take or ask for pain medication before doing incentive spirometry. It is harder to take a deep breath if you are having pain. AFTER USE  Rest and breathe slowly and easily.  It can be helpful to keep track of a log of your progress. Your caregiver can provide you with a simple table to help with this. If you are using the spirometer at home, follow these instructions: [place] IF:   You are having difficultly using the spirometer.  You have trouble using the spirometer as often as instructed.  Your pain medication is not giving enough relief while using the spirometer.  You develop fever of 100.5 F (38.1 C) or higher. SEEK IMMEDIATE MEDICAL CARE IF:   You cough up bloody sputum that had not been present before.  You develop fever of 102 F (38.9 C) or greater.  You develop worsening pain at or near the incision site. MAKE SURE YOU:   Understand these instructions.  Will watch your condition.  Will get help right away if you are not doing well or get worse. Document Released: 06/27/2006 Document Revised: 05/09/2011 Document Reviewed: 08/28/2006 [employer] [facility] [employer] Patient Information 2014 [place], [place].   ________________________________________________________________________

## 2020-01-15 ENCOUNTER — Encounter (HOSPITAL_COMMUNITY)
Admission: RE | Admit: 2020-01-15 | Discharge: 2020-01-15 | Disposition: A | Discharge status: Home / Self Care | Payer: Medicare PPO | Source: Ambulatory Visit | Attending: Orthopedic Surgery | Admitting: Orthopedic Surgery

## 2020-01-15 ENCOUNTER — Encounter (HOSPITAL_COMMUNITY): Payer: Self-pay

## 2020-01-15 ENCOUNTER — Other Ambulatory Visit: Payer: Self-pay

## 2020-01-15 DIAGNOSIS — Z01818 Encounter for other preprocedural examination: Secondary | ICD-10-CM | POA: Insufficient documentation

## 2020-01-15 HISTORY — DX: Unspecified osteoarthritis, unspecified site: M19.90

## 2020-01-15 HISTORY — DX: Myoneural disorder, unspecified: G70.9

## 2020-01-15 LAB — COMPREHENSIVE METABOLIC PANEL
ALT: 18 U/L (ref 0–44)
AST: 21 U/L (ref 15–41)
Albumin: 4.4 g/dL (ref 3.5–5.0)
Alkaline Phosphatase: 61 U/L (ref 38–126)
Anion gap: 10 (ref 5–15)
BUN: 17 mg/dL (ref 8–23)
CO2: 30 mmol/L (ref 22–32)
Calcium: 9.8 mg/dL (ref 8.9–10.3)
Chloride: 101 mmol/L (ref 98–111)
Creatinine, Ser: 0.88 mg/dL (ref 0.44–1.00)
GFR, Estimated: >60 mL/min (ref >60)
Glucose, Bld: 107 mg/dL — ABNORMAL HIGH (ref 70–99)
Potassium: 4.2 mmol/L (ref 3.5–5.1)
Sodium: 141 mmol/L (ref 135–145)
Total Bilirubin: 0.5 mg/dL (ref 0.3–1.2)
Total Protein: 7.2 g/dL (ref 6.5–8.1)

## 2020-01-15 LAB — TYPE AND SCREEN
ABO/RH(D): A NEG
Antibody Screen: NEGATIVE

## 2020-01-15 LAB — CBC
HCT: 42.8 % (ref 36.0–46.0)
Hemoglobin: 14.6 g/dL (ref 12.0–15.0)
MCH: 32.5 pg (ref 26.0–34.0)
MCHC: 34.1 g/dL (ref 30.0–36.0)
MCV: 95.3 fL (ref 80.0–100.0)
Platelets: 197 10*3/uL (ref 150–400)
RBC: 4.49 MIL/uL (ref 3.87–5.11)
RDW: 12.1 % (ref 11.5–15.5)
WBC: 7.1 10*3/uL (ref 4.0–10.5)
nRBC: 0 % (ref 0.0–0.2)

## 2020-01-15 LAB — PROTIME-INR
INR: 0.9 (ref 0.8–1.2)
Prothrombin Time: 11.7 seconds (ref 11.4–15.2)

## 2020-01-15 LAB — APTT: aPTT: 31 seconds (ref 24–36)

## 2020-01-15 LAB — SURGICAL PCR SCREEN
MRSA, PCR: NEGATIVE
Staphylococcus aureus: NEGATIVE

## 2020-01-15 NOTE — Progress Notes (Signed)
COVID Vaccine Completed:Yes Date COVID Vaccine completed:05/02/19- Booster 12/24/19 COVID vaccine manufacturer:   Moderna     PCP - Dr. Doristine Section Cardiologist - none  Chest x-ray - no EKG - 01/15/20-Epic and chart Stress Test - no ECHO - no Cardiac Cath - no Pacemaker/ICD device last checked:NA  Sleep Study - no CPAP -   Fasting Blood Sugar - NA Checks Blood Sugar _____ times a day  Blood Thinner Instructions:NA Aspirin Instructions: Last Dose:  Anesthesia review:   Patient denies shortness of breath, fever, cough and chest pain at PAT appointment yes   Patient verbalized understanding of instructions that were given to them at the PAT appointment. Patient was also instructed that they will need to review over the PAT instructions again at home before surgery. Yes Pt can climb 2-3 flights of stairs, do housework and ADLs without SOB . She has a tremor that affects her voice.

## 2020-01-24 ENCOUNTER — Other Ambulatory Visit (HOSPITAL_COMMUNITY)
Admission: RE | Admit: 2020-01-24 | Discharge: 2020-01-24 | Disposition: A | Discharge status: Home / Self Care | Payer: Medicare PPO | Source: Ambulatory Visit | Attending: Orthopedic Surgery | Admitting: Orthopedic Surgery

## 2020-01-24 ENCOUNTER — Other Ambulatory Visit (HOSPITAL_COMMUNITY): Payer: Medicare PPO

## 2020-01-24 DIAGNOSIS — Z20822 Contact with and (suspected) exposure to covid-19: Secondary | ICD-10-CM | POA: Diagnosis not present

## 2020-01-24 DIAGNOSIS — Z01812 Encounter for preprocedural laboratory examination: Secondary | ICD-10-CM | POA: Insufficient documentation

## 2020-01-24 LAB — SARS CORONAVIRUS 2 (TAT 6-24 HRS): SARS Coronavirus 2: NEGATIVE

## 2020-01-26 ENCOUNTER — Encounter (HOSPITAL_COMMUNITY): Payer: Self-pay | Admitting: Orthopedic Surgery

## 2020-01-26 MED ORDER — BUPIVACAINE LIPOSOME 1.3 % IJ SUSP
20.0000 mL | Freq: Once | INTRAMUSCULAR | Status: DC
  Filled 2020-01-26: qty 20

## 2020-01-26 NOTE — H&P (Signed)
TOTAL KNEE ADMISSION H&P  Patient is being admitted for right total knee arthroplasty.  Subjective:  Chief Complaint: Right knee pain.  HPI: Erika Ramsey, 74 y.o. female has a history of pain and functional disability in the right knee due to arthritis and has failed non-surgical conservative treatments for greater than 12 weeks to include corticosteriod injections, use of assistive devices and activity modification. Onset of symptoms was gradual, starting >10 years ago with gradually worsening course since that time. The patient noted no past surgery on the right knee.  Patient currently rates pain in the right knee at 7 out of 10 with activity. Patient has worsening of pain with activity and weight bearing, pain that interferes with activities of daily living and instability. Patient has evidence of bone-on-bone arthritis in the medial compartment of the right knee with a large osteochondral defect. She has significant patellofemoral narrowing where it is close to bone-on-bone by imaging studies. There is no active infection.  Patient Active Problem List   Diagnosis Date Noted  . Contact dermatitis 01/06/2020  . OA (osteoarthritis) of hip 05/04/2016  . 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 Chronic low back pain 06/18/2015  . History of cervical dysplasia 08/07/2012  . Osteopenia   . Atrophic vaginitis   . Hypertension     Past Medical History:  Diagnosis Date  . Arthritis    hips back, knees.hand  . Atrophic vaginitis   . Breast cyst   . 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 Decreased libido   . Hypertension   . Neuromuscular disorder (HCC)    tremors  . Osteopenia 08/2017   T score -1.6 FRAX 11% / 2% stable from prior DEXA  . Shingles     Past Surgical History:  Procedure Laterality Date  . BREAST CYST ASPIRATION    . COLPOSCOPY  1977   CIN 3  . HAND SURGERY    . KNEE SURGERY  2019  . PELVIC LAPAROSCOPY     Diag Lap  . TOTAL HIP ARTHROPLASTY Right 05/04/2016    Procedure: RIGHT TOTAL HIP ARTHROPLASTY ANTERIOR APPROACH;  Surgeon: [name], MD;  Location: [facility];  Service: Orthopedics;  Laterality: Right;    Prior to Admission medications   Medication Sig Start Date End Date Taking? Authorizing Provider  acetaminophen (TYLENOL) 500 MG tablet Take 500 mg by mouth every 6 (six) hours as needed for moderate pain or headache.   Yes [provider]  amLODipine (NORVASC) 5 MG tablet Take 1 tablet (5 mg total) by mouth daily. 07/07/15  Yes Shawnee Knapp, MD  Calcium Citrate-Vitamin D (CALCIUM + D PO) Take 1 tablet by mouth daily.   Yes [provider]  chlorthalidone (HYGROTON) 25 MG tablet Take 12.5 mg by mouth daily.   Yes [provider]  losartan (COZAAR) 100 MG tablet Take 1 tablet (100 mg total) by mouth daily. 04/08/15  Yes Copland, Gay Filler, MD  Multiple Vitamin (MULTIVITAMIN) tablet Take 1 tablet by mouth daily.   Yes [provider]  Probiotic Product (PROBIOTIC PO) Take 1 capsule by mouth daily.   Yes [provider]  TURMERIC PO Take 1 tablet by mouth daily.   Yes [provider]    Allergies  Allergen Reactions  . Pollen Extract   . Tomato     Tongue swells    Social History   Socioeconomic History  . Marital status: Single    Spouse name: Not on file  .  Number of children: Not on file  . Years of education: Not on file  . Highest education level: Not on file  Occupational History  . Not on file  Tobacco Use  . Smoking status: Former Smoker    Quit date: 1979    Years since quitting: 42.9  . Smokeless tobacco: Never Used  Vaping Use  . Vaping Use: Never used  Substance and Sexual Activity  . Alcohol use: Yes    Alcohol/week: 7.0 - 8.0 standard drinks    Types: 1 - 2 Cans of beer, 6 Standard drinks or equivalent per week  . Drug use: No  . Sexual activity: Not Currently    Partners: Male    Birth control/protection: Post-menopausal    Comment: intercourse age 49, sexual  partners less than 5  Other Topics Concern  . Not on file  Social History Narrative  . Not on file   Social Determinants of Health   Financial Resource Strain:   . Difficulty of Paying Living Expenses: Not on file  Food Insecurity:   . Worried About Charity fundraiser in the Last Year: Not on file  . Ran Out of Food in the Last Year: Not on file  Transportation Needs:   . Lack of Transportation (Medical): Not on file  . Lack of Transportation (Non-Medical): Not on file  Physical Activity:   . Days of Exercise per Week: Not on file  . Minutes of Exercise per Session: Not on file  Stress:   . Feeling of Stress : Not on file  Social Connections:   . Frequency of Communication with Friends and Family: Not on file  . Frequency of Social Gatherings with Friends and Family: Not on file  . Attends Religious Services: Not on file  . Active Member of Clubs or Organizations: Not on file  . Attends Archivist Meetings: Not on file  . Marital Status: Not on file  Intimate Partner Violence:   . Fear of Current or Ex-Partner: Not on file  . Emotionally Abused: Not on file  . Physically Abused: Not on file  . Sexually Abused: Not on file      Tobacco Use: Medium Risk  . Smoking Tobacco Use: Former Smoker  . Smokeless Tobacco Use: Never Used   Social History   Substance and Sexual Activity  Alcohol Use Yes  . Alcohol/week: 7.0 - 8.0 standard drinks  . Types: 1 - 2 Cans of beer, 6 Standard drinks or equivalent per week    Family History  Problem Relation Age of Onset  . Hypertension Mother   . Heart disease Mother   . Cancer Father        Colon cancer  . Heart disease Father   . Allergic rhinitis Father   . Diabetes Maternal Grandmother   . Cancer Paternal Grandfather        Colon cancer  . Breast cancer Maternal Aunt        Great aunt 52's  . Allergic rhinitis Sister   . Angioedema Neg Hx   . Asthma Neg Hx   . Atopy Neg Hx   . Eczema Neg Hx   .  Immunodeficiency Neg Hx   . Urticaria Neg Hx     Review of Systems  Constitutional: Negative for chills and fever.  HENT: Negative for congestion, sore throat and tinnitus.   Eyes: Negative for double vision, photophobia and pain.  Respiratory: Negative for cough, shortness of breath and wheezing.  Cardiovascular: Negative for chest pain, palpitations and orthopnea.  Gastrointestinal: Negative for heartburn, nausea and vomiting.  Genitourinary: Negative for dysuria, frequency and urgency.  Musculoskeletal: Positive for joint pain.  Neurological: Negative for dizziness, weakness and headaches.    Objective:  Physical Exam: Well nourished and well developed.  General: Alert and oriented x3, cooperative and pleasant, no acute distress.  Head: normocephalic, atraumatic, neck supple.  Eyes: EOMI.  Respiratory: breath sounds clear in all fields, no wheezing, rales, or rhonchi. Cardiovascular: Regular rate and rhythm, no murmurs, gallops or rubs.  Abdomen: non-tender to palpation and soft, normoactive bowel sounds. Musculoskeletal:  Right Knee Exam:  No effusion present. No swelling present.  Slight varus deformity.  The range of motion is: 5 to 125 degrees.  Marked crepitus on range of motion of the knee.  Positive medial joint line tenderness.  No lateral joint line tenderness.  The knee is stable.   Calves soft and nontender. Motor function intact in LE. Strength 5/5 LE bilaterally. Neuro: Distal pulses 2+. Sensation to light touch intact in LE.  Imaging Review Plain radiographs demonstrate severe degenerative joint disease of the right knee. The overall alignment is neutral. The bone quality appears to be adequate for age and reported activity level.  Assessment/Plan:  End stage arthritis, right knee   The patient history, physical examination, clinical judgment of the provider and imaging studies are consistent with end stage degenerative joint disease of the right knee  and total knee arthroplasty is deemed medically necessary. The treatment options including medical management, injection therapy arthroscopy and arthroplasty were discussed at length. The risks and benefits of total knee arthroplasty were presented and reviewed. The risks due to aseptic loosening, infection, stiffness, patella tracking problems, thromboembolic complications and other imponderables were discussed. The patient acknowledged the explanation, agreed to proceed with the plan and consent was signed. Patient is being admitted for inpatient treatment for surgery, pain control, PT, OT, prophylactic antibiotics, VTE prophylaxis, progressive ambulation and ADLs and discharge planning. The patient is planning to be discharged home.   Patient's anticipated LOS is less than 2 midnights, meeting these requirements: - Younger than 36 - Lives within 1 hour of care - Has a competent adult at home to recover with post-op recover - NO history of  - Chronic pain requiring opiods  - Diabetes  - Coronary Artery Disease  - Heart failure  - Heart attack  - Stroke  - DVT/VTE  - Cardiac arrhythmia  - Respiratory Failure/COPD  - Renal failure  - Anemia  - Advanced Liver disease  Therapy Plans: Outpatient therapy at [facility] Disposition: Home with sister and brother-in-law Planned DVT Prophylaxis: Aspirin 325 mg BID DME Needed: None PCP: Horald Pollen, MD (appt on 10/14) TXA: IV Allergies: NKDA Anesthesia Concerns: None BMI: 23.9 Last HgbA1c: Not diabetic Pharmacy: [facility]  Other:  - Potential nickel allergy - patch test on 11/8-11/12 (FINAL RESULT: NEGATIVE)  - Patient was instructed on what medications to stop prior to surgery. - Follow-up visit in 2 weeks with Dr. Wynelle Link - Begin physical therapy following surgery - Pre-operative lab work as pre-surgical testing - Prescriptions will be provided in hospital at time of discharge  Theresa Duty, PA-C Orthopedic  Surgery [facility] [place]

## 2020-01-27 ENCOUNTER — Encounter (HOSPITAL_COMMUNITY): Payer: Self-pay | Admitting: Orthopedic Surgery

## 2020-01-27 ENCOUNTER — Ambulatory Visit (HOSPITAL_COMMUNITY): Payer: Medicare PPO | Admitting: Anesthesiology

## 2020-01-27 ENCOUNTER — Telehealth (HOSPITAL_COMMUNITY): Payer: Self-pay | Admitting: *Deleted

## 2020-01-27 ENCOUNTER — Other Ambulatory Visit: Payer: Self-pay

## 2020-01-27 ENCOUNTER — Encounter (HOSPITAL_COMMUNITY)
Admission: RE | Disposition: A | Discharge status: Home / Self Care | Payer: Self-pay | Source: Ambulatory Visit | Attending: Orthopedic Surgery

## 2020-01-27 ENCOUNTER — Observation Stay (HOSPITAL_COMMUNITY)
Admission: RE | Admit: 2020-01-27 | Discharge: 2020-01-28 | Disposition: A | Discharge status: Home / Self Care | Payer: Medicare PPO | Source: Ambulatory Visit | Attending: Orthopedic Surgery | Admitting: Orthopedic Surgery

## 2020-01-27 DIAGNOSIS — Z87891 Personal history of nicotine dependence: Secondary | ICD-10-CM | POA: Diagnosis not present

## 2020-01-27 DIAGNOSIS — Z7952 Long term (current) use of systemic steroids: Secondary | ICD-10-CM | POA: Insufficient documentation

## 2020-01-27 DIAGNOSIS — I10 Essential (primary) hypertension: Secondary | ICD-10-CM | POA: Diagnosis not present

## 2020-01-27 DIAGNOSIS — Z96641 Presence of right artificial hip joint: Secondary | ICD-10-CM | POA: Insufficient documentation

## 2020-01-27 DIAGNOSIS — M1711 Unilateral primary osteoarthritis, right knee: Principal | ICD-10-CM | POA: Insufficient documentation

## 2020-01-27 DIAGNOSIS — Z79899 Other long term (current) drug therapy: Secondary | ICD-10-CM | POA: Insufficient documentation

## 2020-01-27 DIAGNOSIS — M1710 Unilateral primary osteoarthritis, unspecified knee: Secondary | ICD-10-CM | POA: Diagnosis present

## 2020-01-27 DIAGNOSIS — Z7982 Long term (current) use of aspirin: Secondary | ICD-10-CM | POA: Diagnosis not present

## 2020-01-27 DIAGNOSIS — M25561 Pain in right knee: Secondary | ICD-10-CM | POA: Diagnosis present

## 2020-01-27 DIAGNOSIS — M179 Osteoarthritis of knee, unspecified: Secondary | ICD-10-CM | POA: Diagnosis present

## 2020-01-27 HISTORY — PX: TOTAL KNEE ARTHROPLASTY: SHX125

## 2020-01-27 SURGERY — ARTHROPLASTY, KNEE, TOTAL
Anesthesia: Regional | Site: Knee | Laterality: Right

## 2020-01-27 MED ORDER — BUPIVACAINE LIPOSOME 1.3 % IJ SUSP
INTRAMUSCULAR | Status: DC | PRN
  Administered 2020-01-27: 20 mL

## 2020-01-27 MED ORDER — SODIUM CHLORIDE (PF) 0.9 % IJ SOLN
INTRAMUSCULAR | Status: AC
  Filled 2020-01-27: qty 10

## 2020-01-27 MED ORDER — PHENOL 1.4 % MT LIQD: 1.0000 | OROMUCOSAL | Status: DC | PRN

## 2020-01-27 MED ORDER — TRANEXAMIC ACID-NACL 1000-0.7 MG/100ML-% IV SOLN
1000.0000 mg | INTRAVENOUS | Status: AC
  Administered 2020-01-27: 1000 mg via INTRAVENOUS
  Filled 2020-01-27: qty 100

## 2020-01-27 MED ORDER — PHENYLEPHRINE HCL-NACL 10-0.9 MG/250ML-% IV SOLN
INTRAVENOUS | Status: DC | PRN
  Administered 2020-01-27: 25 ug/min via INTRAVENOUS

## 2020-01-27 MED ORDER — SODIUM CHLORIDE (PF) 0.9 % IJ SOLN
INTRAMUSCULAR | Status: AC
  Filled 2020-01-27: qty 50

## 2020-01-27 MED ORDER — PROPOFOL 10 MG/ML IV BOLUS
INTRAVENOUS | Status: DC | PRN
  Administered 2020-01-27: 20 mg via INTRAVENOUS

## 2020-01-27 MED ORDER — SODIUM CHLORIDE (PF) 0.9 % IJ SOLN
INTRAMUSCULAR | Status: DC | PRN
  Administered 2020-01-27: 60 mL

## 2020-01-27 MED ORDER — LOSARTAN POTASSIUM 50 MG PO TABS
100.0000 mg | ORAL_TABLET | Freq: Every day | ORAL | Status: DC
  Administered 2020-01-28: 100 mg via ORAL
  Filled 2020-01-27: qty 2

## 2020-01-27 MED ORDER — EPHEDRINE 5 MG/ML INJ
INTRAVENOUS | Status: AC
  Filled 2020-01-27: qty 10

## 2020-01-27 MED ORDER — AMLODIPINE BESYLATE 5 MG PO TABS
5.0000 mg | ORAL_TABLET | Freq: Every day | ORAL | Status: DC
  Administered 2020-01-28: 5 mg via ORAL
  Filled 2020-01-27: qty 1

## 2020-01-27 MED ORDER — POVIDONE-IODINE 10 % EX SWAB
2.0000 "application " | Freq: Once | CUTANEOUS | Status: AC
  Administered 2020-01-27: 2 via TOPICAL

## 2020-01-27 MED ORDER — FENTANYL CITRATE (PF) 100 MCG/2ML IJ SOLN: 25.0000 ug | INTRAMUSCULAR | Status: DC | PRN

## 2020-01-27 MED ORDER — ONDANSETRON HCL 4 MG PO TABS: 4.0000 mg | ORAL_TABLET | Freq: Four times a day (QID) | ORAL | Status: DC | PRN

## 2020-01-27 MED ORDER — CHLORHEXIDINE GLUCONATE 0.12 % MT SOLN
15.0000 mL | Freq: Once | OROMUCOSAL | Status: AC
  Administered 2020-01-27: 15 mL via OROMUCOSAL

## 2020-01-27 MED ORDER — METOCLOPRAMIDE HCL 5 MG/ML IJ SOLN: 5.0000 mg | Freq: Three times a day (TID) | INTRAMUSCULAR | Status: DC | PRN

## 2020-01-27 MED ORDER — 0.9 % SODIUM CHLORIDE (POUR BTL) OPTIME
TOPICAL | Status: DC | PRN
  Administered 2020-01-27: 1000 mL

## 2020-01-27 MED ORDER — POLYETHYLENE GLYCOL 3350 17 G PO PACK: 17.0000 g | PACK | Freq: Every day | ORAL | Status: DC | PRN

## 2020-01-27 MED ORDER — ONDANSETRON HCL 4 MG/2ML IJ SOLN
INTRAMUSCULAR | Status: DC | PRN
  Administered 2020-01-27: 4 mg via INTRAVENOUS

## 2020-01-27 MED ORDER — ONDANSETRON HCL 4 MG/2ML IJ SOLN: 4.0000 mg | Freq: Once | INTRAMUSCULAR | Status: DC | PRN

## 2020-01-27 MED ORDER — ONDANSETRON HCL 4 MG/2ML IJ SOLN: 4.0000 mg | Freq: Four times a day (QID) | INTRAMUSCULAR | Status: DC | PRN

## 2020-01-27 MED ORDER — METHOCARBAMOL 500 MG PO TABS
500.0000 mg | ORAL_TABLET | Freq: Four times a day (QID) | ORAL | Status: DC | PRN
  Administered 2020-01-27 – 2020-01-28 (×2): 500 mg via ORAL
  Filled 2020-01-27 (×2): qty 1

## 2020-01-27 MED ORDER — CHLORTHALIDONE 25 MG PO TABS
12.5000 mg | ORAL_TABLET | Freq: Every day | ORAL | Status: DC
  Administered 2020-01-28: 12.5 mg via ORAL
  Filled 2020-01-27: qty 1

## 2020-01-27 MED ORDER — FLEET ENEMA 7-19 GM/118ML RE ENEM: 1.0000 | ENEMA | Freq: Once | RECTAL | Status: DC | PRN

## 2020-01-27 MED ORDER — LACTATED RINGERS IV SOLN: INTRAVENOUS | Status: DC

## 2020-01-27 MED ORDER — PROPOFOL 10 MG/ML IV BOLUS
INTRAVENOUS | Status: AC
  Filled 2020-01-27: qty 40

## 2020-01-27 MED ORDER — METOCLOPRAMIDE HCL 5 MG PO TABS: 5.0000 mg | ORAL_TABLET | Freq: Three times a day (TID) | ORAL | Status: DC | PRN

## 2020-01-27 MED ORDER — EPHEDRINE SULFATE 50 MG/ML IJ SOLN
INTRAMUSCULAR | Status: DC | PRN
  Administered 2020-01-27: 5 mg via INTRAVENOUS

## 2020-01-27 MED ORDER — DIPHENHYDRAMINE HCL 12.5 MG/5ML PO ELIX: 12.5000 mg | ORAL_SOLUTION | ORAL | Status: DC | PRN

## 2020-01-27 MED ORDER — ACETAMINOPHEN 10 MG/ML IV SOLN
1000.0000 mg | Freq: Four times a day (QID) | INTRAVENOUS | Status: DC
  Administered 2020-01-27: 1000 mg via INTRAVENOUS
  Filled 2020-01-27: qty 100

## 2020-01-27 MED ORDER — MORPHINE SULFATE (PF) 2 MG/ML IV SOLN: 0.5000 mg | INTRAVENOUS | Status: DC | PRN

## 2020-01-27 MED ORDER — ASPIRIN EC 325 MG PO TBEC
325.0000 mg | DELAYED_RELEASE_TABLET | Freq: Two times a day (BID) | ORAL | Status: DC
  Administered 2020-01-28: 325 mg via ORAL
  Filled 2020-01-27: qty 1

## 2020-01-27 MED ORDER — DEXAMETHASONE SODIUM PHOSPHATE 10 MG/ML IJ SOLN
INTRAMUSCULAR | Status: DC | PRN
  Administered 2020-01-27: 8 mg via INTRAVENOUS

## 2020-01-27 MED ORDER — FENTANYL CITRATE (PF) 100 MCG/2ML IJ SOLN
50.0000 ug | INTRAMUSCULAR | Status: DC
  Filled 2020-01-27: qty 2

## 2020-01-27 MED ORDER — ORAL CARE MOUTH RINSE: 15.0000 mL | Freq: Once | OROMUCOSAL | Status: AC

## 2020-01-27 MED ORDER — BUPIVACAINE IN DEXTROSE 0.75-8.25 % IT SOLN
INTRATHECAL | Status: DC | PRN
  Administered 2020-01-27: 1.4 mL via INTRATHECAL

## 2020-01-27 MED ORDER — CEFAZOLIN SODIUM-DEXTROSE 2-4 GM/100ML-% IV SOLN
2.0000 g | Freq: Four times a day (QID) | INTRAVENOUS | Status: AC
  Administered 2020-01-27 (×2): 2 g via INTRAVENOUS
  Filled 2020-01-27 (×2): qty 100

## 2020-01-27 MED ORDER — BISACODYL 10 MG RE SUPP: 10.0000 mg | Freq: Every day | RECTAL | Status: DC | PRN

## 2020-01-27 MED ORDER — CEFAZOLIN SODIUM-DEXTROSE 2-4 GM/100ML-% IV SOLN
2.0000 g | INTRAVENOUS | Status: AC
  Administered 2020-01-27: 2 g via INTRAVENOUS
  Filled 2020-01-27: qty 100

## 2020-01-27 MED ORDER — DEXAMETHASONE SODIUM PHOSPHATE 10 MG/ML IJ SOLN
10.0000 mg | Freq: Once | INTRAMUSCULAR | Status: AC
  Administered 2020-01-28: 10 mg via INTRAVENOUS
  Filled 2020-01-27: qty 1

## 2020-01-27 MED ORDER — PROPOFOL 500 MG/50ML IV EMUL
INTRAVENOUS | Status: DC | PRN
  Administered 2020-01-27: 100 ug/kg/min via INTRAVENOUS

## 2020-01-27 MED ORDER — ROPIVACAINE HCL 5 MG/ML IJ SOLN
INTRAMUSCULAR | Status: DC | PRN
  Administered 2020-01-27: 30 mL via PERINEURAL

## 2020-01-27 MED ORDER — SODIUM CHLORIDE 0.9 % IR SOLN
Status: DC | PRN
  Administered 2020-01-27: 1000 mL

## 2020-01-27 MED ORDER — SODIUM CHLORIDE 0.9 % IV SOLN: INTRAVENOUS | Status: DC

## 2020-01-27 MED ORDER — DEXAMETHASONE SODIUM PHOSPHATE 10 MG/ML IJ SOLN: 8.0000 mg | Freq: Once | INTRAMUSCULAR | Status: DC

## 2020-01-27 MED ORDER — MENTHOL 3 MG MT LOZG: 1.0000 | LOZENGE | OROMUCOSAL | Status: DC | PRN

## 2020-01-27 MED ORDER — STERILE WATER FOR IRRIGATION IR SOLN
Status: DC | PRN
  Administered 2020-01-27: 2000 mL

## 2020-01-27 MED ORDER — TRAMADOL HCL 50 MG PO TABS
50.0000 mg | ORAL_TABLET | Freq: Four times a day (QID) | ORAL | Status: DC | PRN
  Administered 2020-01-28: 50 mg via ORAL
  Filled 2020-01-27: qty 1

## 2020-01-27 MED ORDER — MIDAZOLAM HCL 2 MG/2ML IJ SOLN
1.0000 mg | INTRAMUSCULAR | Status: AC
  Administered 2020-01-27: 1 mg via INTRAVENOUS
  Filled 2020-01-27: qty 2

## 2020-01-27 MED ORDER — OXYCODONE HCL 5 MG PO TABS
5.0000 mg | ORAL_TABLET | ORAL | Status: DC | PRN
  Administered 2020-01-27: 5 mg via ORAL
  Filled 2020-01-27: qty 1

## 2020-01-27 MED ORDER — DOCUSATE SODIUM 100 MG PO CAPS
100.0000 mg | ORAL_CAPSULE | Freq: Two times a day (BID) | ORAL | Status: DC
  Administered 2020-01-27 – 2020-01-28 (×2): 100 mg via ORAL
  Filled 2020-01-27 (×2): qty 1

## 2020-01-27 MED ORDER — METHOCARBAMOL 500 MG IVPB - SIMPLE MED
500.0000 mg | Freq: Four times a day (QID) | INTRAVENOUS | Status: DC | PRN
  Filled 2020-01-27: qty 50

## 2020-01-27 MED ORDER — GABAPENTIN 300 MG PO CAPS
300.0000 mg | ORAL_CAPSULE | Freq: Three times a day (TID) | ORAL | Status: DC
  Administered 2020-01-27 – 2020-01-28 (×4): 300 mg via ORAL
  Filled 2020-01-27 (×4): qty 1

## 2020-01-27 MED ORDER — ACETAMINOPHEN 500 MG PO TABS
1000.0000 mg | ORAL_TABLET | Freq: Four times a day (QID) | ORAL | Status: AC
  Administered 2020-01-27 – 2020-01-28 (×4): 1000 mg via ORAL
  Filled 2020-01-27 (×4): qty 2

## 2020-01-27 SURGICAL SUPPLY — 54 items
ATTUNE PSFEM RTSZ4 NARCEM KNEE (Femur) ×3 IMPLANT
ATTUNE PSRP INSR SZ4 8 KNEE (Insert) ×2 IMPLANT
ATTUNE PSRP INSR SZ4 8MM KNEE (Insert) ×1 IMPLANT
BAG ZIPLOCK 12X15 (MISCELLANEOUS) ×3 IMPLANT
BASE TIBIAL ROT PLAT SZ 3 KNEE (Knees) ×1 IMPLANT
BLADE SAG 18X100X1.27 (BLADE) ×3 IMPLANT
BLADE SAW SGTL 11.0X1.19X90.0M (BLADE) ×3 IMPLANT
BLADE SURG SZ10 CARB STEEL (BLADE) ×6 IMPLANT
BNDG ELASTIC 6X5.8 VLCR STR LF (GAUZE/BANDAGES/DRESSINGS) ×3 IMPLANT
BOWL SMART MIX CTS (DISPOSABLE) ×3 IMPLANT
CEMENT HV SMART SET (Cement) ×6 IMPLANT
CLOSURE WOUND 1/2 X4 (GAUZE/BANDAGES/DRESSINGS) ×2
COVER SURGICAL LIGHT HANDLE (MISCELLANEOUS) ×3 IMPLANT
COVER WAND RF STERILE (DRAPES) ×3 IMPLANT
CUFF TOURN SGL QUICK 34 (TOURNIQUET CUFF) ×3
CUFF TRNQT CYL 34X4.125X (TOURNIQUET CUFF) ×1 IMPLANT
DECANTER SPIKE VIAL GLASS SM (MISCELLANEOUS) ×3 IMPLANT
DRAPE U-SHAPE 47X51 STRL (DRAPES) ×3 IMPLANT
DRSG AQUACEL AG ADV 3.5X10 (GAUZE/BANDAGES/DRESSINGS) ×3 IMPLANT
DURAPREP 26ML APPLICATOR (WOUND CARE) ×3 IMPLANT
ELECT REM PT RETURN 15FT ADLT (MISCELLANEOUS) ×3 IMPLANT
GLOVE BIO SURGEON STRL SZ7 (GLOVE) ×3 IMPLANT
GLOVE BIO SURGEON STRL SZ8 (GLOVE) ×3 IMPLANT
GLOVE BIOGEL PI IND STRL 7.0 (GLOVE) ×1 IMPLANT
GLOVE BIOGEL PI IND STRL 8 (GLOVE) ×1 IMPLANT
GLOVE BIOGEL PI INDICATOR 7.0 (GLOVE) ×2
GLOVE BIOGEL PI INDICATOR 8 (GLOVE) ×2
GOWN STRL REUS W/TWL LRG LVL3 (GOWN DISPOSABLE) ×6 IMPLANT
HANDPIECE INTERPULSE COAX TIP (DISPOSABLE) ×3
HOLDER FOLEY CATH W/STRAP (MISCELLANEOUS) ×3 IMPLANT
IMMOBILIZER KNEE 20 (SOFTGOODS) ×3
IMMOBILIZER KNEE 20 THIGH 36 (SOFTGOODS) ×1 IMPLANT
KIT TURNOVER KIT A (KITS) IMPLANT
MANIFOLD NEPTUNE II (INSTRUMENTS) ×3 IMPLANT
NS IRRIG 1000ML POUR BTL (IV SOLUTION) ×3 IMPLANT
PACK TOTAL KNEE CUSTOM (KITS) ×3 IMPLANT
PADDING CAST COTTON 6X4 STRL (CAST SUPPLIES) ×3 IMPLANT
PATELLA MEDIAL ATTUN 35MM KNEE (Knees) ×3 IMPLANT
PENCIL SMOKE EVACUATOR (MISCELLANEOUS) ×3 IMPLANT
PIN DRILL FIX HALF THREAD (BIT) ×3 IMPLANT
PIN STEINMAN FIXATION KNEE (PIN) ×3 IMPLANT
PROTECTOR NERVE ULNAR (MISCELLANEOUS) ×3 IMPLANT
SET HNDPC FAN SPRY TIP SCT (DISPOSABLE) ×1 IMPLANT
STRIP CLOSURE SKIN 1/2X4 (GAUZE/BANDAGES/DRESSINGS) ×4 IMPLANT
SUT MNCRL AB 4-0 PS2 18 (SUTURE) ×3 IMPLANT
SUT STRATAFIX 0 PDS 27 VIOLET (SUTURE) ×3
SUT VIC AB 2-0 CT1 27 (SUTURE) ×9
SUT VIC AB 2-0 CT1 TAPERPNT 27 (SUTURE) ×3 IMPLANT
SUTURE STRATFX 0 PDS 27 VIOLET (SUTURE) ×1 IMPLANT
SYR BULB IRRIG 60ML STRL (SYRINGE) ×3 IMPLANT
TIBIAL BASE ROT PLAT SZ 3 KNEE (Knees) ×3 IMPLANT
TRAY FOLEY MTR SLVR 14FR STAT (SET/KITS/TRAYS/PACK) ×3 IMPLANT
WATER STERILE IRR 1000ML POUR (IV SOLUTION) ×6 IMPLANT
WRAP KNEE MAXI GEL POST OP (GAUZE/BANDAGES/DRESSINGS) ×3 IMPLANT

## 2020-01-27 NOTE — Anesthesia Preprocedure Evaluation (Addendum)
Anesthesia Evaluation  Patient identified by MRN, date of birth, ID band Patient awake    Reviewed: Allergy & Precautions, NPO status , Patient's Chart, lab work & pertinent test results  Airway Mallampati: I  TM Distance: >3 FB Neck ROM: Full    Dental no notable dental hx.    Pulmonary former smoker,    Pulmonary exam normal breath sounds clear to auscultation       Cardiovascular hypertension, Pt. on medications Normal cardiovascular exam Rhythm:Regular Rate:Normal  ECG: NSR, rate 60   Neuro/Psych PSYCHIATRIC DISORDERS  Neuromuscular disease    GI/Hepatic negative GI ROS, Neg liver ROS,   Endo/Other  negative endocrine ROS  Renal/GU negative Renal ROS     Musculoskeletal  (+) Arthritis , Osteoarthritis,  Chronic low back pain   Abdominal   Peds  Hematology negative hematology ROS (+)   Anesthesia Other Findings right knee osteoarthritis  Reproductive/Obstetrics                            Anesthesia Physical Anesthesia Plan  ASA: II  Anesthesia Plan: Spinal and Regional   Post-op Pain Management:  Regional for Post-op pain   Induction: Intravenous  PONV Risk Score and Plan: 2 and Ondansetron, Dexamethasone, Propofol infusion and Treatment may vary due to age or medical condition  Airway Management Planned: Simple Face Mask  Additional Equipment:   Intra-op Plan:   Post-operative Plan:   Informed Consent: I have reviewed the patients History and Physical, chart, labs and discussed the procedure including the risks, benefits and alternatives for the proposed anesthesia with the patient or authorized representative who has indicated his/her understanding and acceptance.     Dental advisory given  Plan Discussed with: CRNA  Anesthesia Plan Comments:         Anesthesia Quick Evaluation

## 2020-01-27 NOTE — Interval H&P Note (Signed)
History and Physical Interval Note:  01/27/2020 9:29 AM  Erika Ramsey  has presented today for surgery, with the diagnosis of right knee osteoarthritis.  The various methods of treatment have been discussed with the patient and family. After consideration of risks, benefits and other options for treatment, the patient has consented to  Procedure(s) with comments: TOTAL KNEE ARTHROPLASTY (Right) - 56min as a surgical intervention.  The patient's history has been reviewed, patient examined, no change in status, stable for surgery.  I have reviewed the patient's chart and labs.  Questions were answered to the patient's satisfaction.     Pilar Plate Rejina Odle

## 2020-01-27 NOTE — Anesthesia Procedure Notes (Signed)
Anesthesia Regional Block: Adductor canal block   Pre-Anesthetic Checklist: ,, timeout performed, Correct Patient, Correct Site, Correct Laterality, Correct Procedure,, site marked, risks and benefits discussed, Surgical consent,  Pre-op evaluation,  At surgeon's request and post-op pain management  Laterality: Right  Prep: chloraprep       Needles:  Injection technique: Single-shot  Needle Type: Echogenic Stimulator Needle     Needle Length: 10cm  Needle Gauge: 20     Additional Needles:   Procedures:,,,, ultrasound used (permanent image in chart),,,,  Narrative:  Start time: 01/27/2020 9:50 AM End time: 01/27/2020 10:00 AM Injection made incrementally with aspirations every 5 mL.  Performed by: Personally  Anesthesiologist: Murvin Natal, MD  Additional Notes: Functioning IV was confirmed and monitors were applied. A time-out was performed. Hand hygiene and sterile gloves were used. The thigh was placed in a frog-leg position and prepped in a sterile fashion. A 17mm 20ga BBraun echogenic stimulator needle was placed using ultrasound guidance.  Negative aspiration and negative test dose prior to incremental administration of local anesthetic. The patient tolerated the procedure well.

## 2020-01-27 NOTE — Progress Notes (Signed)
Orthopedic Tech Progress Note Patient Details:  Erika Ramsey [facility] 1945/10/03 [id number]  CPM Right Knee CPM Right Knee: On Right Knee Flexion (Degrees): 10 Right Knee Extension (Degrees): 40    Ortho Devices Ortho Device/Splint Interventions: Ordered, Application       Braulio Bosch 01/27/2020, 12:29 PM

## 2020-01-27 NOTE — Anesthesia Postprocedure Evaluation (Signed)
Anesthesia Post Note  Patient: Zora Glendenning Dombkowski  Procedure(s) Performed: TOTAL KNEE ARTHROPLASTY (Right Knee)     Patient location during evaluation: PACU Anesthesia Type: Regional and Spinal Level of consciousness: oriented and awake and alert Pain management: pain level controlled Vital Signs Assessment: post-procedure vital signs reviewed and stable Respiratory status: spontaneous breathing, respiratory function stable and patient connected to nasal cannula oxygen Cardiovascular status: blood pressure returned to baseline and stable Postop Assessment: no headache, no backache and no apparent nausea or vomiting Anesthetic complications: no   No complications documented.  Last Vitals:  Vitals:   01/27/20 1330 01/27/20 1356  BP: 120/82 134/69  Pulse: (!) 49 62  Resp: 16 16  Temp: (!) 36.1 C 36.4 C  SpO2: 100% 100%    Last Pain:  Vitals:   01/27/20 1413  TempSrc:   PainSc: 0-No pain                 Rheagan Nayak P Travis Mastel

## 2020-01-27 NOTE — Anesthesia Procedure Notes (Signed)
Spinal  Patient location during procedure: OR Start time: 01/27/2020 10:25 AM End time: 01/27/2020 10:30 AM Staffing Performed: anesthesiologist  Anesthesiologist: Murvin Natal, MD Preanesthetic Checklist Completed: patient identified, IV checked, risks and benefits discussed, surgical consent, monitors and equipment checked, pre-op evaluation and timeout performed Spinal Block Patient position: sitting Prep: DuraPrep Patient monitoring: cardiac monitor, continuous pulse ox and blood pressure Approach: midline Location: L4-5 Injection technique: single-shot Needle Needle type: Pencan  Needle gauge: 24 G Needle length: 9 cm Assessment Sensory level: T10 Additional Notes Functioning IV was confirmed and monitors were applied. Sterile prep and drape, including hand hygiene and sterile gloves were used. The patient was positioned and the spine was prepped. The skin was anesthetized with lidocaine.  Free flow of clear CSF was obtained prior to injecting local anesthetic into the CSF.  The spinal needle aspirated freely following injection.  The needle was carefully withdrawn.  The patient tolerated the procedure well.

## 2020-01-27 NOTE — Progress Notes (Signed)
AssistedDr. Ellender with right, ultrasound guided, adductor canal block. Side rails up, monitors on throughout procedure. See vital signs in flow sheet. Tolerated Procedure well.  

## 2020-01-27 NOTE — Evaluation (Signed)
Physical Therapy Evaluation Patient Details Name: Erika Ramsey MRN: [id number] DOB: [birth year] Today's Date: 01/27/2020   History of Present Illness  s/p R TKA. PMH: R DA THA, HTN, tremors  Clinical Impression  Pt is s/p TKA resulting in the deficits listed below (see PT Problem List).  Pt amb ~ 56' with RW and min assist. anticipate steady progress in acute setting.  Pt will benefit from skilled PT to increase their independence and safety with mobility to allow discharge to the venue listed below.      Follow Up Recommendations Follow surgeon's recommendation for DC plan and follow-up therapies    Equipment Recommendations    none   Recommendations for Other Services       Precautions / Restrictions Precautions Precautions: Fall;Knee Required Braces or Orthoses: Knee Immobilizer - Right Knee Immobilizer - Right: Discontinue once straight leg raise with < 10 degree lag Restrictions Weight Bearing Restrictions: No Other Position/Activity Restrictions: WBAT      Mobility  Bed Mobility Overal bed mobility: Needs Assistance Bed Mobility: Supine to Sit     Supine to sit: Min guard;Min assist     General bed mobility comments: for safety, incr time, assist with RL E    Transfers Overall transfer level: Needs assistance Equipment used: Rolling walker (2 wheeled) Transfers: Sit to/from Stand Sit to Stand: Min assist         General transfer comment: light assist to rise and transition to RW  Ambulation/Gait Ambulation/Gait assistance: Min assist;Min guard Gait Distance (Feet): 50 Feet Assistive device: Rolling walker (2 wheeled) Gait Pattern/deviations: Step-to pattern;Decreased stance time - right;Decreased weight shift to right     General Gait Details: cues for sequence and RW position, steadying assist  Stairs            Wheelchair Mobility    Modified Rankin (Stroke Patients Only)       Balance                                              Pertinent Vitals/Pain Pain Assessment: 0-10 Pain Score: 5  Pain Location: right knee Pain Descriptors / Indicators: Burning;Sore Pain Intervention(s): Limited activity within patient's tolerance;Monitored during session;Repositioned;Premedicated before session    [place] expects to be discharged to:: Private residence Living Arrangements: Alone Available Help at Discharge: Family Type of Home: House Home Access: Stairs to enter Entrance Stairs-Rails: Psychiatric nurse of Steps: 3 to walk way and 3 Home Layout: One level Home Equipment: Environmental consultant - 2 wheels      Prior Function Level of Independence: Independent               Hand Dominance        Extremity/Trunk Assessment   Upper Extremity Assessment Upper Extremity Assessment: Overall WFL for tasks assessed    Lower Extremity Assessment Lower Extremity Assessment: RLE deficits/detail RLE Deficits / Details: ankle WFL, knee extension and hip flexion grossly 3/5.       Communication   Communication: No difficulties  Cognition Arousal/Alertness: Awake/alert Behavior During Therapy: WFL for tasks assessed/performed Overall Cognitive Status: Within Functional Limits for tasks assessed                                        General Comments  Exercises Total Joint Exercises Ankle Circles/Pumps: AROM;Both;10 reps   Assessment/Plan    PT Assessment Patient needs continued PT services  PT Problem List Decreased strength;Decreased mobility;Decreased activity tolerance;Decreased balance;Decreased knowledge of use of DME;Pain;Decreased range of motion       PT Treatment Interventions DME instruction;Therapeutic exercise;Gait training;Functional mobility training;Therapeutic activities;Patient/family education;Stair training    PT Goals (Current goals can be found in the Care Plan section)  Acute Rehab PT Goals Patient Stated  Goal: return to IND PT Goal Formulation: With patient Time For Goal Achievement: 02/03/20 Potential to Achieve Goals: Good    Frequency 7X/week   Barriers to discharge        Co-evaluation               AM-PAC PT "6 Clicks" Mobility  Outcome Measure Help needed turning from your back to your side while in a flat bed without using bedrails?: A Little Help needed moving from lying on your back to sitting on the side of a flat bed without using bedrails?: A Little Help needed moving to and from a bed to a chair (including a wheelchair)?: A Little Help needed standing up from a chair using your arms (e.g., wheelchair or bedside chair)?: A Little Help needed to walk in hospital room?: A Little Help needed climbing 3-5 steps with a railing? : A Little 6 Click Score: 18    End of Session Equipment Utilized During Treatment: Gait belt;Right knee immobilizer Activity Tolerance: Patient tolerated treatment well Patient left: with call bell/phone within reach;in chair;with chair alarm set   PT Visit Diagnosis: Difficulty in walking, not elsewhere classified (R26.2)    Time: 8250-0370 PT Time Calculation (min) (ACUTE ONLY): 25 min   Charges:   PT Evaluation $PT Eval Low Complexity: 1 Low PT Treatments $Gait Training: 8-22 mins        Baxter Flattery, PT  Acute Rehab Dept ([place]) [phone] Pager [phone]  01/27/2020   [facility] 01/27/2020, 5:11 PM

## 2020-01-27 NOTE — Op Note (Signed)
OPERATIVE REPORT-TOTAL KNEE ARTHROPLASTY   Pre-operative diagnosis- Osteoarthritis  Right knee(s)  Post-operative diagnosis- Osteoarthritis Right knee(s)  Procedure-  Right  Total Knee Arthroplasty  Surgeon- Dione Plover. Woodie Trusty, MD  Assistant- Molli Barrows, PA-C   Anesthesia-  Adductor canal block and spinal  EBL-20 mL   Drains None  Tourniquet time-  Total Tourniquet Time Documented: Thigh (Right) - 37 minutes Total: Thigh (Right) - 37 minutes     Complications- None  Condition-PACU - hemodynamically stable.   Brief Clinical Note  Erika Ramsey is a 74 y.o. year old female with end stage OA of her right knee with progressively worsening pain and dysfunction. She has constant pain, with activity and at rest and significant functional deficits with difficulties even with ADLs. She has had extensive non-op management including analgesics, injections of cortisone and viscosupplements, and home exercise program, but remains in significant pain with significant dysfunction.Radiographs show bone on bone arthritis medial and patellofemoral. She presents now for right Total Knee Arthroplasty.    Procedure in detail---   The patient is brought into the operating room and positioned supine on the operating table. After successful administration of  Adductor canal block and spinal,   a tourniquet is placed high on the  Right thigh(s) and the lower extremity is prepped and draped in the usual sterile fashion. Time out is performed by the operating team and then the  Right lower extremity is wrapped in Esmarch, knee flexed and the tourniquet inflated to 300 mmHg.       A midline incision is made with a ten blade through the subcutaneous tissue to the level of the extensor mechanism. A fresh blade is used to make a medial parapatellar arthrotomy. Soft tissue over the proximal medial tibia is subperiosteally elevated to the joint line with a knife and into the semimembranosus bursa with a  Cobb elevator. Soft tissue over the proximal lateral tibia is elevated with attention being paid to avoiding the patellar tendon on the tibial tubercle. The patella is everted, knee flexed 90 degrees and the ACL and PCL are removed. Findings are bone on bone medial and patellofemoral with large global osteophytes.        The drill is used to create a starting hole in the distal femur and the canal is thoroughly irrigated with sterile saline to remove the fatty contents. The 5 degree Right  valgus alignment guide is placed into the femoral canal and the distal femoral cutting block is pinned to remove 9 mm off the distal femur. Resection is made with an oscillating saw.      The tibia is subluxed forward and the menisci are removed. The extramedullary alignment guide is placed referencing proximally at the medial aspect of the tibial tubercle and distally along the second metatarsal axis and tibial crest. The block is pinned to remove 49mm off the more deficient medial  side. Resection is made with an oscillating saw. Size 3is the most appropriate size for the tibia and the proximal tibia is prepared with the modular drill and keel punch for that size.      The femoral sizing guide is placed and size 4 is most appropriate. Rotation is marked off the epicondylar axis and confirmed by creating a rectangular flexion gap at 90 degrees. The size 4 cutting block is pinned in this rotation and the anterior, posterior and chamfer cuts are made with the oscillating saw. The intercondylar block is then placed and that cut is made.  Trial size 3 tibial component, trial size 4 narrow posterior stabilized femur and a 8  mm posterior stabilized rotating platform insert trial is placed. Full extension is achieved with excellent varus/valgus and anterior/posterior balance throughout full range of motion. The patella is everted and thickness measured to be 22  mm. Free hand resection is taken to 12 mm, a 35 template is placed,  lug holes are drilled, trial patella is placed, and it tracks normally. Osteophytes are removed off the posterior femur with the trial in place. All trials are removed and the cut bone surfaces prepared with pulsatile lavage. Cement is mixed and once ready for implantation, the size 3 tibial implant, size  4 narrow posterior stabilized femoral component, and the size 35 patella are cemented in place and the patella is held with the clamp. The trial insert is placed and the knee held in full extension. The Exparel (20 ml mixed with 60 ml saline) is injected into the extensor mechanism, posterior capsule, medial and lateral gutters and subcutaneous tissues.  All extruded cement is removed and once the cement is hard the permanent 8 mm posterior stabilized rotating platform insert is placed into the tibial tray.      The wound is copiously irrigated with saline solution and the extensor mechanism closed with # 0 Stratofix suture. The tourniquet is released for a total tourniquet time of 37  minutes. Flexion against gravity is 140 degrees and the patella tracks normally. Subcutaneous tissue is closed with 2.0 vicryl and subcuticular with running 4.0 Monocryl. The incision is cleaned and dried and steri-strips and a bulky sterile dressing are applied. The limb is placed into a knee immobilizer and the patient is awakened and transported to recovery in stable condition.      Please note that a surgical assistant was a medical necessity for this procedure in order to perform it in a safe and expeditious manner. Surgical assistant was necessary to retract the ligaments and vital neurovascular structures to prevent injury to them and also necessary for proper positioning of the limb to allow for anatomic placement of the prosthesis.   Dione Plover Lunetta Marina, MD    01/27/2020, 11:32 AM

## 2020-01-27 NOTE — Transfer of Care (Signed)
Immediate Anesthesia Transfer of Care Note  Patient: Erika Ramsey  Procedure(s) Performed: Procedure(s) with comments: TOTAL KNEE ARTHROPLASTY (Right) - 7min  Patient Location: PACU  Anesthesia Type:Spinal  Level of Consciousness:  sedated, patient cooperative and responds to stimulation  Airway & Oxygen Therapy:Patient Spontanous Breathing and Patient connected to face mask oxgen  Post-op Assessment:  Report given to PACU RN and Post -op Vital signs reviewed and stable  Post vital signs:  Reviewed and stable  Last Vitals:  Vitals:   01/27/20 1012 01/27/20 1017  BP: 124/64 118/63  Pulse: (!) 53 (!) 57  Resp: 15 19  Temp:    SpO2: 833% 582%    Complications: No apparent anesthesia complications

## 2020-01-27 NOTE — Discharge Instructions (Signed)
 Frank Aluisio, MD Total Joint Specialist [facility] [address] , [place] [address] [phone]  TOTAL KNEE REPLACEMENT POSTOPERATIVE DIRECTIONS    Knee Rehabilitation, Guidelines Following Surgery  Results after knee surgery are often greatly improved when you follow the exercise, range of motion and muscle strengthening exercises prescribed by your doctor. Safety measures are also important to protect the knee from further injury. If any of these exercises cause you to have increased pain or swelling in your knee joint, decrease the amount until you are comfortable again and slowly increase them. If you have problems or questions, call your caregiver or physical therapist for advice.   BLOOD CLOT PREVENTION . Take a 325 mg Aspirin two times a day for three weeks following surgery. Then take an 81 mg Aspirin once a day for three weeks. Then discontinue Aspirin. . You may resume your vitamins/supplements upon discharge from the hospital. . Do not take any NSAIDs (Advil, Aleve, Ibuprofen, Meloxicam, etc.) until you have discontinued the 325 mg Aspirin.  HOME CARE INSTRUCTIONS  . Remove items at home which could result in a fall. This includes throw rugs or furniture in walking pathways.  . ICE to the affected knee as much as tolerated. Icing helps control swelling. If the swelling is well controlled you will be more comfortable and rehab easier. Continue to use ice on the knee for pain and swelling from surgery. You may notice swelling that will progress down to the foot and ankle. This is normal after surgery. Elevate the leg when you are not up walking on it.    . Continue to use the breathing machine which will help keep your temperature down. It is common for your temperature to cycle up and down following surgery, especially at night when you are not up moving around and exerting yourself. The breathing machine keeps your lungs expanded and your  temperature down. . Do not place pillow under the operative knee, focus on keeping the knee straight while resting  DIET You may resume your previous home diet once you are discharged from the hospital.  DRESSING / WOUND CARE / SHOWERING . Keep your bulky bandage on for 2 days. On the third post-operative day you may remove the Ace bandage and gauze. There is a waterproof adhesive bandage on your skin which will stay in place until your first follow-up appointment. Once you remove this you will not need to place another bandage . You may begin showering 3 days following surgery, but do not submerge the incision under water.  ACTIVITY For the first 5 days, the key is rest and control of pain and swelling . Do your home exercises twice a day starting on post-operative day 3. On the days you go to physical therapy, just do the home exercises once that day. . You should rest, ice and elevate the leg for 50 minutes out of every hour. Get up and walk/stretch for 10 minutes per hour. After 5 days you can increase your activity slowly as tolerated. . Walk with your walker as instructed. Use the walker until you are comfortable transitioning to a cane. Walk with the cane in the opposite hand of the operative leg. You may discontinue the cane once you are comfortable and walking steadily. . Avoid periods of inactivity such as sitting longer than an hour when not asleep. This helps prevent blood clots.  . You may discontinue the knee immobilizer once you are able to perform a straight   leg raise while lying down. . You may resume a sexual relationship in one month or when given the OK by your doctor.  . You may return to work once you are cleared by your doctor.  . Do not drive a car for 6 weeks or until released by your surgeon.  . Do not drive while taking narcotics.  TED HOSE STOCKINGS Wear the elastic stockings on both legs for three weeks following surgery during the day. You may remove them at night  for sleeping.  WEIGHT BEARING Weight bearing as tolerated with assist device (walker, cane, etc) as directed, use it as long as suggested by your surgeon or therapist, typically at least 4-6 weeks.  POSTOPERATIVE CONSTIPATION PROTOCOL Constipation - defined medically as fewer than three stools per week and severe constipation as less than one stool per week.  One of the most common issues patients have following surgery is constipation.  Even if you have a regular bowel pattern at home, your normal regimen is likely to be disrupted due to multiple reasons following surgery.  Combination of anesthesia, postoperative narcotics, change in appetite and fluid intake all can affect your bowels.  In order to avoid complications following surgery, here are some recommendations in order to help you during your recovery period.  . Colace (docusate) - Pick up an over-the-counter form of Colace or another stool softener and take twice a day as long as you are requiring postoperative pain medications.  Take with a full glass of water daily.  If you experience loose stools or diarrhea, hold the colace until you stool forms back up. If your symptoms do not get better within 1 week or if they get worse, check with your doctor. . Dulcolax (bisacodyl) - Pick up over-the-counter and take as directed by the product packaging as needed to assist with the movement of your bowels.  Take with a full glass of water.  Use this product as needed if not relieved by Colace only.  . MiraLax (polyethylene glycol) - Pick up over-the-counter to have on hand. MiraLax is a solution that will increase the amount of water in your bowels to assist with bowel movements.  Take as directed and can mix with a glass of water, juice, soda, coffee, or tea. Take if you go more than two days without a movement. Do not use MiraLax more than once per day. Call your doctor if you are still constipated or irregular after using this medication for 7 days  in a row.  If you continue to have problems with postoperative constipation, please contact the office for further assistance and recommendations.  If you experience "the worst abdominal pain ever" or develop nausea or vomiting, please contact the office immediatly for further recommendations for treatment.  ITCHING If you experience itching with your medications, try taking only a single pain pill, or even half a pain pill at a time.  You can also use Benadryl over the counter for itching or also to help with sleep.   MEDICATIONS See your medication summary on the "After Visit Summary" that the nursing staff will review with you prior to discharge.  You may have some home medications which will be placed on hold until you complete the course of blood thinner medication.  It is important for you to complete the blood thinner medication as prescribed by your surgeon.  Continue your approved medications as instructed at time of discharge.  PRECAUTIONS . If you experience chest pain or shortness of   breath - call 911 immediately for transfer to the hospital emergency department.  . If you develop a fever greater that 101 F, purulent drainage from wound, increased redness or drainage from wound, foul odor from the wound/dressing, or calf pain - CONTACT YOUR SURGEON.                                                   FOLLOW-UP APPOINTMENTS Make sure you keep all of your appointments after your operation with your surgeon and caregivers. You should call the office at the above phone number and make an appointment for approximately two weeks after the date of your surgery or on the date instructed by your surgeon outlined in the "After Visit Summary".  RANGE OF MOTION AND STRENGTHENING EXERCISES  Rehabilitation of the knee is important following a knee injury or an operation. After just a few days of immobilization, the muscles of the thigh which control the knee become weakened and shrink (atrophy). Knee  exercises are designed to build up the tone and strength of the thigh muscles and to improve knee motion. Often times heat used for twenty to thirty minutes before working out will loosen up your tissues and help with improving the range of motion but do not use heat for the first two weeks following surgery. These exercises can be done on a training (exercise) mat, on the floor, on a table or on a bed. Use what ever works the best and is most comfortable for you Knee exercises include:  . Leg Lifts - While your knee is still immobilized in a splint or cast, you can do straight leg raises. Lift the leg to 60 degrees, hold for 3 sec, and slowly lower the leg. Repeat 10-20 times 2-3 times daily. Perform this exercise against resistance later as your knee gets better.  . Quad and Hamstring Sets - Tighten up the muscle on the front of the thigh (Quad) and hold for 5-10 sec. Repeat this 10-20 times hourly. Hamstring sets are done by pushing the foot backward against an object and holding for 5-10 sec. Repeat as with quad sets.   Leg Slides: Lying on your back, slowly slide your foot toward your buttocks, bending your knee up off the floor (only go as far as is comfortable). Then slowly slide your foot back down until your leg is flat on the floor again.  Angel Wings: Lying on your back spread your legs to the side as far apart as you can without causing discomfort.  A rehabilitation program following serious knee injuries can speed recovery and prevent re-injury in the future due to weakened muscles. Contact your doctor or a physical therapist for more information on knee rehabilitation.   IF YOU ARE TRANSFERRED TO A SKILLED REHAB FACILITY If the patient is transferred to a skilled rehab facility following release from the hospital, a list of the current medications will be sent to the facility for the patient to continue.  When discharged from the skilled rehab facility, please have the facility set up the  patient's Home Health Physical Therapy prior to being released. Also, the skilled facility will be responsible for providing the patient with their medications at time of release from the facility to include their pain medication, the muscle relaxants, and their blood thinner medication. If the patient is still at the   rehab facility at time of the two week follow up appointment, the skilled rehab facility will also need to assist the patient in arranging follow up appointment in our office and any transportation needs.  MAKE SURE YOU:  . Understand these instructions.  . Get help right away if you are not doing well or get worse.   DENTAL ANTIBIOTICS:  In most cases prophylactic antibiotics for Dental procdeures after total joint surgery are not necessary.  Exceptions are as follows:  1. History of prior total joint infection  2. Severely immunocompromised (Organ Transplant, cancer chemotherapy, Rheumatoid biologic meds such as Humera)  3. Poorly controlled diabetes (A1C &gt; 8.0, blood glucose over 200)  If you have one of these conditions, contact your surgeon for an antibiotic prescription, prior to your dental procedure.    Pick up stool softner and laxative for home use following surgery while on pain medications. Do not submerge incision under water. Please use good hand washing techniques while changing dressing each day. May shower starting three days after surgery. Please use a clean towel to pat the incision dry following showers. Continue to use ice for pain and swelling after surgery. Do not use any lotions or creams on the incision until instructed by your surgeon.  

## 2020-01-27 NOTE — Progress Notes (Signed)
Orthopedic Tech Progress Note Patient Details:  Lenia Housley January 21, 1946 [id number]  Ortho Devices Ortho Device/Splint Interventions: Ordered, Application       Braulio Bosch 01/27/2020, 3:48 PM

## 2020-01-28 ENCOUNTER — Encounter (HOSPITAL_COMMUNITY): Payer: Self-pay | Admitting: Orthopedic Surgery

## 2020-01-28 DIAGNOSIS — M1711 Unilateral primary osteoarthritis, right knee: Secondary | ICD-10-CM | POA: Diagnosis not present

## 2020-01-28 LAB — BASIC METABOLIC PANEL
Anion gap: 10 (ref 5–15)
BUN: 19 mg/dL (ref 8–23)
CO2: 24 mmol/L (ref 22–32)
Calcium: 8 mg/dL — ABNORMAL LOW (ref 8.9–10.3)
Chloride: 103 mmol/L (ref 98–111)
Creatinine, Ser: 0.93 mg/dL (ref 0.44–1.00)
GFR, Estimated: >60 mL/min (ref >60)
Glucose, Bld: 153 mg/dL — ABNORMAL HIGH (ref 70–99)
Potassium: 3.4 mmol/L — ABNORMAL LOW (ref 3.5–5.1)
Sodium: 137 mmol/L (ref 135–145)

## 2020-01-28 LAB — CBC
HCT: 33.5 % — ABNORMAL LOW (ref 36.0–46.0)
Hemoglobin: 11.2 g/dL — ABNORMAL LOW (ref 12.0–15.0)
MCH: 32 pg (ref 26.0–34.0)
MCHC: 33.4 g/dL (ref 30.0–36.0)
MCV: 95.7 fL (ref 80.0–100.0)
Platelets: 187 10*3/uL (ref 150–400)
RBC: 3.5 MIL/uL — ABNORMAL LOW (ref 3.87–5.11)
RDW: 12.3 % (ref 11.5–15.5)
WBC: 12.3 10*3/uL — ABNORMAL HIGH (ref 4.0–10.5)
nRBC: 0 % (ref 0.0–0.2)

## 2020-01-28 MED ORDER — ASPIRIN 325 MG PO TBEC: 325.0000 mg | DELAYED_RELEASE_TABLET | Freq: Two times a day (BID) | ORAL | 0 refills | Status: AC

## 2020-01-28 MED ORDER — GABAPENTIN 300 MG PO CAPS
ORAL_CAPSULE | ORAL | 0 refills | Status: DC
Start: 2020-01-28 — End: 2020-05-05

## 2020-01-28 MED ORDER — TRAMADOL HCL 50 MG PO TABS: 50.0000 mg | ORAL_TABLET | Freq: Four times a day (QID) | ORAL | 0 refills | Status: DC | PRN

## 2020-01-28 MED ORDER — METHOCARBAMOL 500 MG PO TABS
500.0000 mg | ORAL_TABLET | Freq: Four times a day (QID) | ORAL | 0 refills | Status: DC | PRN
Start: 2020-01-28 — End: 2020-05-05

## 2020-01-28 MED ORDER — OXYCODONE HCL 5 MG PO TABS
5.0000 mg | ORAL_TABLET | Freq: Four times a day (QID) | ORAL | 0 refills | Status: DC | PRN
Start: 2020-01-28 — End: 2020-05-05

## 2020-01-28 MED ORDER — POTASSIUM CHLORIDE CRYS ER 20 MEQ PO TBCR
40.0000 meq | EXTENDED_RELEASE_TABLET | ORAL | Status: AC
  Administered 2020-01-28 (×2): 40 meq via ORAL
  Filled 2020-01-28 (×2): qty 2

## 2020-01-28 NOTE — Progress Notes (Signed)
Physical Therapy Treatment Patient Details Name: Erika Ramsey MRN: [id number] DOB: [birth year] Today's Date: 01/28/2020    History of Present Illness s/p R TKA. PMH: R DA THA, HTN, tremors    PT Comments    Pt progressing well today. Limited recall of having PT yesterday. Ready for d/c with family assist from PT standpoint. See below for areas of instruction.    Follow Up Recommendations  Follow surgeon's recommendation for DC plan and follow-up therapies     Equipment Recommendations  None recommended by PT    Recommendations for Other Services       Precautions / Restrictions Precautions Precautions: Fall;Knee Required Braces or Orthoses: Knee Immobilizer - Right Knee Immobilizer - Right: Discontinue once straight leg raise with < 10 degree lag Restrictions Weight Bearing Restrictions: No Other Position/Activity Restrictions: WBAT    Mobility  Bed Mobility               General bed mobility comments: in recliner on arrival   Transfers Overall transfer level: Needs assistance Equipment used: Rolling walker (2 wheeled) Transfers: Sit to/from Stand Sit to Stand: Min guard;Supervision         General transfer comment: cues for hand placement   Ambulation/Gait Ambulation/Gait assistance: Supervision;Min guard Gait Distance (Feet): 140 Feet Assistive device: Rolling walker (2 wheeled) Gait Pattern/deviations: Step-to pattern;Decreased stance time - right;Decreased weight shift to right     General Gait Details: cues for sequence and RW position   Stairs Stairs: Yes Stairs assistance: Min guard Stair Management: One rail Right;One rail Left;Step to pattern;Sideways;Forwards;Two rails Number of Stairs: 3 (x4) General stair comments: up/down 3 x2 steps with one rail, up/down 3 with bil rails . cues for sequence    Wheelchair Mobility    Modified Rankin (Stroke Patients Only)       Balance                                             Cognition Arousal/Alertness: Awake/alert Behavior During Therapy: WFL for tasks assessed/performed Overall Cognitive Status: Within Functional Limits for tasks assessed                                        Exercises Total Joint Exercises Ankle Circles/Pumps: AROM;Both;10 reps Quad Sets: AROM;Both;10 reps Heel Slides: AAROM;Right;10 reps    General Comments        Pertinent Vitals/Pain Pain Assessment: 0-10 Pain Score: 1  Pain Location: right knee Pain Descriptors / Indicators: Burning;Sore Pain Intervention(s): Limited activity within patient's tolerance;Monitored during session;Premedicated before session;Ice applied    Home Living                      Prior Function            PT Goals (current goals can now be found in the care plan section) Acute Rehab PT Goals Patient Stated Goal: return to IND PT Goal Formulation: With patient Time For Goal Achievement: 02/03/20 Potential to Achieve Goals: Good Progress towards PT goals: Progressing toward goals    Frequency    7X/week      PT Plan Current plan remains appropriate    Co-evaluation              AM-PAC PT "6 Clicks" Mobility  Outcome Measure  Help needed turning from your back to your side while in a flat bed without using bedrails?: None Help needed moving from lying on your back to sitting on the side of a flat bed without using bedrails?: A Little Help needed moving to and from a bed to a chair (including a wheelchair)?: A Little Help needed standing up from a chair using your arms (e.g., wheelchair or bedside chair)?: A Little Help needed to walk in hospital room?: A Little Help needed climbing 3-5 steps with a railing? : A Little 6 Click Score: 19    End of Session Equipment Utilized During Treatment: Gait belt;Right knee immobilizer Activity Tolerance: Patient tolerated treatment well Patient left: with call bell/phone within reach;in chair;with  chair alarm set   PT Visit Diagnosis: Difficulty in walking, not elsewhere classified (R26.2)     Time: 7654-6503 PT Time Calculation (min) (ACUTE ONLY): 34 min  Charges:  $Gait Training: 8-22 mins $Therapeutic Exercise: 8-22 mins                     Baxter Flattery, PT  Acute Rehab Dept ([place]) [phone] Pager [phone]  01/28/2020    [facility] 01/28/2020, 12:39 PM

## 2020-01-28 NOTE — TOC Initial Note (Signed)
Transition of Care [facility]) - Initial/Assessment Note    Patient Details  Name: Erika Ramsey MRN: [id number] Date of Birth: Dec 13, 1945  [facility] CM/SW Contact:    Joaquin Courts, RN Phone Number: 01/28/2020, 9:59 AM  Clinical Narrative:                 Patient plans to discharge with OPPT services.  Reports has rw at home and declines 3in1.  Expected Discharge Plan: Home/Self Care Barriers to Discharge: No Barriers Identified   Patient Goals and CMS Choice Patient states their goals for this hospitalization and ongoing recovery are:: to go home      Expected Discharge Plan and Services Expected Discharge Plan: Home/Self Care   Discharge Planning Services: CM Consult   Living arrangements for the past 2 months: Apartment Expected Discharge Date: 01/28/20               DME Arranged: N/A DME Agency: NA                  Prior Living Arrangements/Services Living arrangements for the past 2 months: Apartment   Patient language and need for interpreter reviewed:: Yes Do you feel safe going back to the place where you live?: Yes      Need for Family Participation in Patient Care: Yes (Comment) Care giver support system in place?: Yes (comment)   Criminal Activity/Legal Involvement Pertinent to Current Situation/Hospitalization: No - Comment as needed  Activities of Daily Living Home Assistive Devices/Equipment: Eyeglasses, Environmental consultant (specify type), Cane (specify quad or straight), Grab bars in shower, Shower chair with back ADL Screening (condition at time of admission) Patient's cognitive ability adequate to safely complete daily activities?: Yes Is the patient deaf or have difficulty hearing?: No Does the patient have difficulty seeing, even when wearing glasses/contacts?: No Does the patient have difficulty concentrating, remembering, or making decisions?: No Patient able to express need for assistance with ADLs?: Yes Does the patient  have difficulty dressing or bathing?: No Independently performs ADLs?: Yes (appropriate for developmental age) Does the patient have difficulty walking or climbing stairs?: No Weakness of Legs: Right Weakness of Arms/Hands: None  Permission Sought/Granted                  Emotional Assessment Appearance:: Appears stated age Attitude/Demeanor/Rapport: Engaged Affect (typically observed): Accepting Orientation: : Oriented to Self, Oriented to Place, Oriented to  Time, Oriented to Situation   Psych Involvement: No (comment)  Admission diagnosis:  Primary osteoarthritis of right knee [M17.11] Patient Active Problem List   Diagnosis Date Noted  . OA (osteoarthritis) of knee 01/27/2020  . Primary osteoarthritis of right knee 01/27/2020  . Contact dermatitis 01/06/2020  . 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 Chronic low back pain 06/18/2015  . History of cervical dysplasia 08/07/2012  . Osteopenia   . Atrophic vaginitis   . Hypertension    PCP:  [name], MD Pharmacy:   [place], [place] [address] Phone: [phone] Fax: [phone]     Social Determinants of Health (SDOH) Interventions    Readmission Risk Interventions No flowsheet data found.

## 2020-01-28 NOTE — Plan of Care (Signed)
Plan of care reviewed and discussed with the patient. 

## 2020-01-28 NOTE — Progress Notes (Signed)
   Subjective: 1 Day Post-Op Procedure(s) (LRB): TOTAL KNEE ARTHROPLASTY (Right) Patient reports pain as mild.   Patient seen in rounds by Dr. Wynelle Link. Patient is well, and has had no acute complaints or problems. No issues overnight. Denies chest pain, SOB, or calf pain. Foley catheter removed this AM. We will continue therapy today, ambulated 72' yesterday.   Objective: Vital signs in last 24 hours: Temp:  [97 F (36.1 C)-98.5 F (36.9 C)] 97.6 F (36.4 C) (11/30 0530) Pulse Rate:  [44-84] 55 (11/30 0530) Resp:  [12-24] 18 (11/30 0530) BP: (97-143)/(49-116) 125/63 (11/30 0530) SpO2:  [96 %-100 %] 96 % (11/30 0530) Weight:  [59.1 kg] 59.1 kg (11/29 0923)  Intake/Output from previous day:  Intake/Output Summary (Last 24 hours) at 01/28/2020 0714 Last data filed at 01/28/2020 0529 Gross per 24 hour  Intake 3176.04 ml  Output 3395 ml  Net -218.96 ml     Intake/Output this shift: No intake/output data recorded.  Labs: Recent Labs    01/28/20 0348  HGB 11.2*   Recent Labs    01/28/20 0348  WBC 12.3*  RBC 3.50*  HCT 33.5*  PLT 187   Recent Labs    01/28/20 0348  NA 137  K 3.4*  CL 103  CO2 24  BUN 19  CREATININE 0.93  GLUCOSE 153*  CALCIUM 8.0*   No results for input(s): LABPT, INR in the last 72 hours.  Exam: General - Patient is Alert and Oriented Extremity - Neurologically intact Neurovascular intact Sensation intact distally Dorsiflexion/Plantar flexion intact Dressing - dressing C/D/I Motor Function - intact, moving foot and toes well on exam.   Past Medical History:  Diagnosis Date  . Arthritis    hips back, knees.hand  . Atrophic vaginitis   . Breast cyst   . 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 Decreased libido   . Hypertension   . Neuromuscular disorder (HCC)    tremors  . Osteopenia 08/2017   T score -1.6 FRAX 11% / 2% stable from prior DEXA  . Shingles     Assessment/Plan: 1 Day Post-Op Procedure(s) (LRB): TOTAL KNEE  ARTHROPLASTY (Right) Principal Problem:   OA (osteoarthritis) of knee Active Problems:   Primary osteoarthritis of right knee  Estimated body mass index is 23.85 kg/m as calculated from the following:   Height as of this encounter: 5\' 2"  (1.575 m).   Weight as of this encounter: 59.1 kg. Advance diet Up with therapy D/C IV fluids   Patient's anticipated LOS is less than 2 midnights, meeting these requirements: - Lives within 1 hour of care - Has a competent adult at home to recover with post-op recover - NO history of  - Chronic pain requiring opioids  - Diabetes  - Coronary Artery Disease  - Heart failure  - Heart attack  - Stroke  - DVT/VTE  - Cardiac arrhythmia  - Respiratory Failure/COPD  - Renal failure  - Anemia  - Advanced Liver disease  DVT Prophylaxis - Aspirin Weight bearing as tolerated. Continue therapy.  Potassium 3.4 this AM, two doses of 40 mEq KCl ordered.  Plan is to go Home after hospital stay. Plan for discharge later today if progresses with therapy and meeting goals. Scheduled for outpatient physical therapy at [facility]. Follow-up in the office December 14th.  The PDMP database was reviewed today prior to any opioid medications being prescribed to this patient.  Theresa Duty, PA-C Orthopedic Surgery [phone] 01/28/2020, 7:14 AM

## 2020-01-28 NOTE — Progress Notes (Signed)
Patient discharged to home w/ family. Given all belongings, instructions, equipment. Verbalized understanding of instructions. Escorted to pov via w/c. 

## 2020-01-29 NOTE — Discharge Summary (Signed)
Physician Discharge Summary   Patient ID: Erika Ramsey MRN: [id number] DOB/AGE: 1945/03/01 74 y.o.  Admit date: 01/27/2020 Discharge date: 01/28/2020  Primary Diagnosis: Osteoarthritis, right knee   Admission Diagnoses:  Past Medical History:  Diagnosis Date  . Arthritis    hips back, knees.hand  . Atrophic vaginitis   . Breast cyst   . 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 Decreased libido   . Hypertension   . Neuromuscular disorder (HCC)    tremors  . Osteopenia 08/2017   T score -1.6 FRAX 11% / 2% stable from prior DEXA  . Shingles    Discharge Diagnoses:   Principal Problem:   OA (osteoarthritis) of knee Active Problems:   Primary osteoarthritis of right knee  Estimated body mass index is 23.85 kg/m as calculated from the following:   Height as of this encounter: 5\' 2"  (1.575 m).   Weight as of this encounter: 59.1 kg.  Procedure:  Procedure(s) (LRB): TOTAL KNEE ARTHROPLASTY (Right)   Consults: None  HPI: Erika Ramsey is a 74 y.o. year old female with end stage OA of her right knee with progressively worsening pain and dysfunction. She has constant pain, with activity and at rest and significant functional deficits with difficulties even with ADLs. She has had extensive non-op management including analgesics, injections of cortisone and viscosupplements, and home exercise program, but remains in significant pain with significant dysfunction.Radiographs show bone on bone arthritis medial and patellofemoral. She presents now for right Total Knee Arthroplasty.     Laboratory Data: Admission on 01/27/2020, Discharged on 01/28/2020  Component Date Value Ref Range Status  . WBC 01/28/2020 12.3* 4.0 - 10.5 K/uL Final  . RBC 01/28/2020 3.50* 3.87 - 5.11 MIL/uL Final  . Hemoglobin 01/28/2020 11.2* 12.0 - 15.0 g/dL Final  . HCT 01/28/2020 33.5* 36 - 46 % Final  . MCV 01/28/2020 95.7  80.0 - 100.0 fL Final  . MCH 01/28/2020 32.0  26.0 - 34.0 pg Final  .  MCHC 01/28/2020 33.4  30.0 - 36.0 g/dL Final  . RDW 01/28/2020 12.3  11.5 - 15.5 % Final  . Platelets 01/28/2020 187  150 - 400 K/uL Final  . nRBC 01/28/2020 0.0  0.0 - 0.2 % Final   Performed at [facility], [place] [address] [place], [place] [address]  . Sodium 01/28/2020 137  135 - 145 mmol/L Final  . Potassium 01/28/2020 3.4* 3.5 - 5.1 mmol/L Final  . Chloride 01/28/2020 103  98 - 111 mmol/L Final  . CO2 01/28/2020 24  22 - 32 mmol/L Final  . Glucose, Bld 01/28/2020 153* 70 - 99 mg/dL Final   Glucose reference range applies only to samples taken after fasting for at least 8 hours.  . BUN 01/28/2020 19  8 - 23 mg/dL Final  . Creatinine, Ser 01/28/2020 0.93  0.44 - 1.00 mg/dL Final  . Calcium 01/28/2020 8.0* 8.9 - 10.3 mg/dL Final  . GFR, Estimated 01/28/2020 >60  >60 mL/min Final   Comment: (NOTE) Calculated using the CKD-EPI Creatinine Equation (2021)   . Anion gap 01/28/2020 10  5 - 15 Final   Performed at [facility], [place] [address] [place], [place] [address]  Hospital Outpatient Visit on 01/24/2020  Component Date Value Ref Range Status  . SARS Coronavirus 2 01/24/2020 NEGATIVE  NEGATIVE Final   Comment: (NOTE) SARS-CoV-2 target nucleic acids are NOT DETECTED.  The SARS-CoV-2 RNA is generally detectable in upper and lower respiratory  specimens during the acute phase of infection. Negative results do not preclude SARS-CoV-2 infection, do not rule out co-infections with other pathogens, and should not be used as the sole basis for treatment or other patient management decisions. Negative results must be combined with clinical observations, patient history, and epidemiological information. The expected result is Negative.  Fact Sheet for Patients: SugarRoll.be  Fact Sheet for Healthcare Providers: https://www.woods-mathews.com/  This test is not yet approved or cleared by the [place] FDA  and  has been authorized for detection and/or diagnosis of SARS-CoV-2 by FDA under an Emergency Use Authorization (EUA). This EUA will remain  in effect (meaning this test can be used) for the duration of the COVID-19 declaration under Se                          ction 564(b)(1) of the Act, 21 U.S.C. section 360bbb-3(b)(1), unless the authorization is terminated or revoked sooner.  Performed at [facility], [place] [address] [place], [place] [address]   Hospital Outpatient Visit on 01/15/2020  Component Date Value Ref Range Status  . aPTT 01/15/2020 31  24 - 36 seconds Final   Performed at [facility], [place] [address], [place], [place] [address]  . WBC 01/15/2020 7.1  4.0 - 10.5 K/uL Final  . RBC 01/15/2020 4.49  3.87 - 5.11 MIL/uL Final  . Hemoglobin 01/15/2020 14.6  12.0 - 15.0 g/dL Final  . HCT 01/15/2020 42.8  36 - 46 % Final  . MCV 01/15/2020 95.3  80.0 - 100.0 fL Final  . MCH 01/15/2020 32.5  26.0 - 34.0 pg Final  . MCHC 01/15/2020 34.1  30.0 - 36.0 g/dL Final  . RDW 01/15/2020 12.1  11.5 - 15.5 % Final  . Platelets 01/15/2020 197  150 - 400 K/uL Final  . nRBC 01/15/2020 0.0  0.0 - 0.2 % Final   Performed at [facility], [place] [address], [place], [place] [address]  . Sodium 01/15/2020 141  135 - 145 mmol/L Final  . Potassium 01/15/2020 4.2  3.5 - 5.1 mmol/L Final  . Chloride 01/15/2020 101  98 - 111 mmol/L Final  . CO2 01/15/2020 30  22 - 32 mmol/L Final  . Glucose, Bld 01/15/2020 107* 70 - 99 mg/dL Final   Glucose reference range applies only to samples taken after fasting for at least 8 hours.  . BUN 01/15/2020 17  8 - 23 mg/dL Final  . Creatinine, Ser 01/15/2020 0.88  0.44 - 1.00 mg/dL Final  . Calcium 01/15/2020 9.8  8.9 - 10.3 mg/dL Final  . Total Protein 01/15/2020 7.2  6.5 - 8.1 g/dL Final  . Albumin 01/15/2020 4.4  3.5 - 5.0 g/dL Final  . AST 01/15/2020 21  15 - 41 U/L Final  . ALT 01/15/2020 18  0 - 44 U/L Final  .  Alkaline Phosphatase 01/15/2020 61  38 - 126 U/L Final  . Total Bilirubin 01/15/2020 0.5  0.3 - 1.2 mg/dL Final  . GFR, Estimated 01/15/2020 >60  >60 mL/min Final   Comment: (NOTE) Calculated using the CKD-EPI Creatinine Equation (2021)   . Anion gap 01/15/2020 10  5 - 15 Final   Performed at [facility], [place] [address], [place], [place] [address]  . Prothrombin Time 01/15/2020 11.7  11.4 - 15.2 seconds Final  . INR 01/15/2020 0.9  0.8 - 1.2 Final   Comment: (NOTE) INR goal varies based  on device and disease states. Performed at [facility], [place] [address] [place], [place] [address]   . ABO/RH(D) 01/15/2020 A NEG   Final  . Antibody Screen 01/15/2020 NEG   Final  . Sample Expiration 01/15/2020 01/29/2020,2359   Final  . Extend sample reason 01/15/2020    Final                   Value:NO TRANSFUSIONS OR PREGNANCY IN THE PAST 3 MONTHS Performed at [place] [address] [place], [place] [address]   . MRSA, PCR 01/15/2020 NEGATIVE  NEGATIVE Final  . Staphylococcus aureus 01/15/2020 NEGATIVE  NEGATIVE Final   Comment: (NOTE) The Xpert SA Assay (FDA approved for NASAL specimens in patients 86 years of age and older), is one component of a comprehensive surveillance program. It is not intended to diagnose infection nor to guide or monitor treatment. Performed at [facility], [place] [address] [place], [place] [address]      X-Rays:No results found.  EKG: Orders placed or performed during the hospital encounter of 01/15/20  . EKG 12-Lead  . EKG 12-Lead     Hospital Course: Reola Buckles is a 74 y.o. who was admitted to [facility]. They were brought to the operating room on 01/27/2020 and underwent Procedure(s): TOTAL KNEE ARTHROPLASTY.  Patient tolerated the procedure well and was later transferred to the recovery room and then to the orthopaedic floor for postoperative care.  They were given PO and IV analgesics for pain control following their surgery. They were given 24 hours of postoperative antibiotics of  Anti-infectives (From admission, onward)   Start     Dose/Rate Route Frequency Ordered Stop   01/27/20 1600  ceFAZolin (ANCEF) IVPB 2g/100 mL premix        2 g 200 mL/hr over 30 Minutes Intravenous Every 6 hours 01/27/20 1340 01/27/20 2143   01/27/20 0900  ceFAZolin (ANCEF) IVPB 2g/100 mL premix        2 g 200 mL/hr over 30 Minutes Intravenous On call to O.R. 01/27/20 6063 01/27/20 1025     and started on DVT prophylaxis in the form of Aspirin.   PT and OT were ordered for total joint protocol. Discharge planning consulted to help with postop disposition and equipment needs.  Patient had a good night on the evening of surgery. They started to get up OOB with therapy on POD #0. Pt was seen during rounds and was ready to go home pending progress with therapy. Potassium low at 3.4, two doses of 40 mEq KCl were ordered. She worked with therapy on POD #1 and was meeting her goals. Pt was discharged to home later that day in stable condition.  Diet: Regular diet Activity: WBAT Follow-up: in 2 weeks Disposition: Home with outpatient physical therapy Discharged Condition: stable   Discharge Instructions    Call MD / Call 911   Complete by: As directed    If you experience chest pain or shortness of breath, CALL 911 and be transported to the hospital emergency room.  If you develope a fever above 101 F, pus (white drainage) or increased drainage or redness at the wound, or calf pain, call your surgeon's office.   Change dressing   Complete by: As directed    You may remove the bulky bandage (ACE wrap and gauze) two days after surgery. You will have an adhesive waterproof bandage underneath. Leave this in place until your first follow-up  appointment.   Constipation Prevention   Complete by: As directed    Drink plenty of fluids.  Prune juice may be helpful.  You  may use a stool softener, such as Colace (over the counter) 100 mg twice a day.  Use MiraLax (over the counter) for constipation as needed.   Diet - low sodium heart healthy   Complete by: As directed    Do not put a pillow under the knee. Place it under the heel.   Complete by: As directed    Driving restrictions   Complete by: As directed    No driving for two weeks   TED hose   Complete by: As directed    Use stockings (TED hose) for three weeks on both leg(s).  You may remove them at night for sleeping.   Weight bearing as tolerated   Complete by: As directed      Allergies as of 01/28/2020      Reactions   Pollen Extract    Tomato    Tongue swells      Medication List    TAKE these medications   acetaminophen 500 MG tablet Commonly known as: TYLENOL Take 500 mg by mouth every 6 (six) hours as needed for moderate pain or headache.   amLODipine 5 MG tablet Commonly known as: NORVASC Take 1 tablet (5 mg total) by mouth daily.   aspirin 325 MG EC tablet Take 1 tablet (325 mg total) by mouth 2 (two) times daily for 20 days. Then take one 81 mg aspirin once a day for three weeks. Then discontinue aspirin.   CALCIUM + D PO Take 1 tablet by mouth daily.   chlorthalidone 25 MG tablet Commonly known as: HYGROTON Take 12.5 mg by mouth daily.   gabapentin 300 MG capsule Commonly known as: NEURONTIN Take a 300 mg capsule three times a day for two weeks following surgery.Then take a 300 mg capsule two times a day for two weeks. Then take a 300 mg capsule once a day for two weeks. Then discontinue.   losartan 100 MG tablet Commonly known as: COZAAR Take 1 tablet (100 mg total) by mouth daily.   methocarbamol 500 MG tablet Commonly known as: ROBAXIN Take 1 tablet (500 mg total) by mouth every 6 (six) hours as needed for muscle spasms.   multivitamin tablet Take 1 tablet by mouth daily.   oxyCODONE 5 MG immediate release tablet Commonly known as: Oxy IR/ROXICODONE Take  1-2 tablets (5-10 mg total) by mouth every 6 (six) hours as needed for severe pain.   PROBIOTIC PO Take 1 capsule by mouth daily.   traMADol 50 MG tablet Commonly known as: ULTRAM Take 1 tablet (50 mg total) by mouth every 6 (six) hours as needed for moderate pain.   TURMERIC PO Take 1 tablet by mouth daily.            Discharge Care Instructions  (From admission, onward)         Start     Ordered   01/28/20 0000  Weight bearing as tolerated        01/28/20 0723   01/28/20 0000  Change dressing       Comments: You may remove the bulky bandage (ACE wrap and gauze) two days after surgery. You will have an adhesive waterproof bandage underneath. Leave this in place until your first follow-up appointment.   01/28/20 0723          Follow-up Information  [name], MD. Schedule an appointment as soon as possible for a visit on 02/11/2020.   Specialty: Orthopedic Surgery Contact information: [address] [place] [address] [phone]               Signed: Theresa Duty, PA-C Orthopedic Surgery 01/29/2020, 10:45 AM

## 2020-02-29 HISTORY — PX: BREAST LUMPECTOMY: SHX2

## 2020-03-04 ENCOUNTER — Other Ambulatory Visit: Payer: Self-pay | Admitting: Obstetrics & Gynecology

## 2020-03-04 DIAGNOSIS — Z1231 Encounter for screening mammogram for malignant neoplasm of breast: Secondary | ICD-10-CM

## 2020-04-14 ENCOUNTER — Ambulatory Visit: Payer: Medicare PPO

## 2020-04-14 ENCOUNTER — Ambulatory Visit
Admission: RE | Admit: 2020-04-14 | Discharge: 2020-04-14 | Disposition: A | Discharge status: Home / Self Care | Payer: Medicare PPO | Source: Ambulatory Visit | Attending: Obstetrics & Gynecology | Admitting: Obstetrics & Gynecology

## 2020-04-14 ENCOUNTER — Other Ambulatory Visit: Payer: Self-pay

## 2020-04-14 DIAGNOSIS — Z1231 Encounter for screening mammogram for malignant neoplasm of breast: Secondary | ICD-10-CM

## 2020-04-20 ENCOUNTER — Other Ambulatory Visit: Payer: Self-pay | Admitting: Obstetrics & Gynecology

## 2020-04-20 DIAGNOSIS — R928 Other abnormal and inconclusive findings on diagnostic imaging of breast: Secondary | ICD-10-CM

## 2020-04-24 NOTE — Progress Notes (Signed)
DUE TO COVID-19 ONLY ONE VISITOR IS ALLOWED TO COME WITH YOU AND STAY IN THE WAITING ROOM ONLY DURING PRE OP AND PROCEDURE DAY OF SURGERY. THE 1 VISITOR  MAY VISIT WITH YOU AFTER SURGERY IN YOUR PRIVATE ROOM DURING VISITING HOURS ONLY!  YOU NEED TO HAVE A COVID 19 TEST ON__3/3/22_____ @_______ , THIS TEST MUST BE DONE BEFORE SURGERY,  COVID TESTING SITE [address] [place] [address], IT IS ON THE RIGHT GOING OUT [address] APPROXIMATELY  2 MINUTES PAST [employer] ON THE RIGHT. ONCE YOUR COVID TEST IS COMPLETED,  PLEASE BEGIN THE QUARANTINE INSTRUCTIONS AS OUTLINED IN YOUR HANDOUT.                Erika Ramsey  04/24/2020   Your procedure is scheduled on: 05/04/20   Report to [facility] Main  Entrance   Report to admitting at 1115   AM     Call this number if you have problems the morning of surgery [phone]    REMEMBER: NO  SOLID FOOD CANDY OR GUM AFTER MIDNIGHT. CLEAR LIQUIDS UNTIL   1045 am       . NOTHING BY MOUTH EXCEPT CLEAR LIQUIDS UNTIL    . PLEASE FINISH ENSURE DRINK PER SURGEON ORDER  WHICH NEEDS TO BE COMPLETED AT     1045am  .      CLEAR LIQUID DIET   Foods Allowed                                                                    Coffee and tea, regular and decaf                            Fruit ices (not with fruit pulp)                                      Iced Popsicles                                    Carbonated beverages, regular and diet                                    Cranberry, grape and apple juices Sports drinks like Gatorade Lightly seasoned clear broth or consume(fat free) Sugar, honey syrup ___________________________________________________________________      BRUSH YOUR TEETH MORNING OF SURGERY AND RINSE YOUR MOUTH OUT, NO CHEWING GUM CANDY OR MINTS.     Take these medicines the morning of surgery with A SIP OF WATER:  Amlodipine   DO NOT TAKE ANY DIABETIC MEDICATIONS DAY OF YOUR SURGERY                                You may not have any metal on your body including hair pins and              piercings  Do not wear  jewelry, make-up, lotions, powders or perfumes, deodorant             Do not wear nail polish on your fingernails.  Do not shave  48 hours prior to surgery.              Men may shave face and neck.   Do not bring valuables to the hospital. [place].  Contacts, dentures or bridgework may not be worn into surgery.  Leave suitcase in the car. After surgery it may be brought to your room.     Patients discharged the day of surgery will not be allowed to drive home. IF YOU ARE HAVING SURGERY AND GOING HOME THE SAME DAY, YOU MUST HAVE AN ADULT TO DRIVE YOU HOME AND BE WITH YOU FOR 24 HOURS. YOU MAY GO HOME BY TAXI OR UBER OR ORTHERWISE, BUT AN ADULT MUST ACCOMPANY YOU HOME AND STAY WITH YOU FOR 24 HOURS.  Name and phone number of your driver:  Special Instructions: N/A              Please read over the following fact sheets you were given: _____________________________________________________________________  [facility] - Preparing for Surgery Before surgery, you can play an important role.  Because skin is not sterile, your skin needs to be as free of germs as possible.  You can reduce the number of germs on your skin by washing with CHG (chlorahexidine gluconate) soap before surgery.  CHG is an antiseptic cleaner which kills germs and bonds with the skin to continue killing germs even after washing. Please DO NOT use if you have an allergy to CHG or antibacterial soaps.  If your skin becomes reddened/irritated stop using the CHG and inform your nurse when you arrive at Short Stay. Do not shave (including legs and underarms) for at least 48 hours prior to the first CHG shower.  You may shave your face/neck. Please follow these instructions carefully:  1.  Shower with CHG Soap the night before surgery and the  morning of  Surgery.  2.  If you choose to wash your hair, wash your hair first as usual with your  normal  shampoo.  3.  After you shampoo, rinse your hair and body thoroughly to remove the  shampoo.                           4.  Use CHG as you would any other liquid soap.  You can apply chg directly  to the skin and wash                       Gently with a scrungie or clean washcloth.  5.  Apply the CHG Soap to your body ONLY FROM THE NECK DOWN.   Do not use on face/ open                           Wound or open sores. Avoid contact with eyes, ears mouth and genitals (private parts).                       Wash face,  Genitals (private parts) with your normal soap.             6.  Wash thoroughly, paying special  attention to the area where your surgery  will be performed.  7.  Thoroughly rinse your body with warm water from the neck down.  8.  DO NOT shower/wash with your normal soap after using and rinsing off  the CHG Soap.                9.  Pat yourself dry with a clean towel.            10.  Wear clean pajamas.            11.  Place clean sheets on your bed the night of your first shower and do not  sleep with pets. Day of Surgery : Do not apply any lotions/deodorants the morning of surgery.  Please wear clean clothes to the hospital/surgery center.  FAILURE TO FOLLOW THESE INSTRUCTIONS MAY RESULT IN THE CANCELLATION OF YOUR SURGERY PATIENT SIGNATURE_________________________________  NURSE SIGNATURE__________________________________  ________________________________________________________________________

## 2020-04-28 ENCOUNTER — Other Ambulatory Visit: Payer: Self-pay

## 2020-04-28 ENCOUNTER — Encounter (HOSPITAL_COMMUNITY)
Admission: RE | Admit: 2020-04-28 | Discharge: 2020-04-28 | Disposition: A | Discharge status: Home / Self Care | Payer: Medicare PPO | Source: Ambulatory Visit | Attending: Orthopedic Surgery | Admitting: Orthopedic Surgery

## 2020-04-28 ENCOUNTER — Encounter (HOSPITAL_COMMUNITY): Payer: Self-pay

## 2020-04-28 DIAGNOSIS — Z01812 Encounter for preprocedural laboratory examination: Secondary | ICD-10-CM | POA: Diagnosis not present

## 2020-04-28 HISTORY — DX: Pneumonia, unspecified organism: J18.9

## 2020-04-28 LAB — COMPREHENSIVE METABOLIC PANEL
ALT: 16 U/L (ref 0–44)
AST: 22 U/L (ref 15–41)
Albumin: 4 g/dL (ref 3.5–5.0)
Alkaline Phosphatase: 68 U/L (ref 38–126)
Anion gap: 14 (ref 5–15)
BUN: 15 mg/dL (ref 8–23)
CO2: 24 mmol/L (ref 22–32)
Calcium: 9.6 mg/dL (ref 8.9–10.3)
Chloride: 101 mmol/L (ref 98–111)
Creatinine, Ser: 0.92 mg/dL (ref 0.44–1.00)
GFR, Estimated: >60 mL/min (ref >60)
Glucose, Bld: 111 mg/dL — ABNORMAL HIGH (ref 70–99)
Potassium: 4.2 mmol/L (ref 3.5–5.1)
Sodium: 139 mmol/L (ref 135–145)
Total Bilirubin: 0.5 mg/dL (ref 0.3–1.2)
Total Protein: 7.3 g/dL (ref 6.5–8.1)

## 2020-04-28 LAB — CBC
HCT: 45.2 % (ref 36.0–46.0)
Hemoglobin: 14.9 g/dL (ref 12.0–15.0)
MCH: 31.1 pg (ref 26.0–34.0)
MCHC: 33 g/dL (ref 30.0–36.0)
MCV: 94.4 fL (ref 80.0–100.0)
Platelets: 245 10*3/uL (ref 150–400)
RBC: 4.79 MIL/uL (ref 3.87–5.11)
RDW: 11.9 % (ref 11.5–15.5)
WBC: 7.8 10*3/uL (ref 4.0–10.5)
nRBC: 0 % (ref 0.0–0.2)

## 2020-04-28 LAB — SURGICAL PCR SCREEN
MRSA, PCR: NEGATIVE
Staphylococcus aureus: NEGATIVE

## 2020-04-28 LAB — PROTIME-INR
INR: 0.9 (ref 0.8–1.2)
Prothrombin Time: 11.9 seconds (ref 11.4–15.2)

## 2020-04-28 LAB — APTT: aPTT: 33 seconds (ref 24–36)

## 2020-04-28 NOTE — Progress Notes (Signed)
Anesthesia Review:  PCP: dr Horald Pollen  Cardiologist : Chest x-ray : EKG : 01/10/20  Echo : Stress test: Cardiac Cath :  Activity level: can do a flgiht of stairs without difficulty  Sleep Study/ CPAP : Fasting Blood Sugar :      / Checks Blood Sugar -- times a day:   Blood Thinner/ Instructions /Last Dose: ASA / Instructions/ Last Dose :

## 2020-04-30 ENCOUNTER — Ambulatory Visit
Admission: RE | Admit: 2020-04-30 | Discharge: 2020-04-30 | Disposition: A | Discharge status: Home / Self Care | Payer: Medicare PPO | Source: Ambulatory Visit | Attending: Obstetrics & Gynecology | Admitting: Obstetrics & Gynecology

## 2020-04-30 ENCOUNTER — Other Ambulatory Visit: Payer: Self-pay

## 2020-04-30 ENCOUNTER — Other Ambulatory Visit: Payer: Self-pay | Admitting: Obstetrics & Gynecology

## 2020-04-30 DIAGNOSIS — R928 Other abnormal and inconclusive findings on diagnostic imaging of breast: Secondary | ICD-10-CM

## 2020-05-01 ENCOUNTER — Other Ambulatory Visit (HOSPITAL_COMMUNITY)
Admission: RE | Admit: 2020-05-01 | Discharge: 2020-05-01 | Disposition: A | Discharge status: Home / Self Care | Payer: Medicare PPO | Source: Ambulatory Visit | Attending: Orthopedic Surgery | Admitting: Orthopedic Surgery

## 2020-05-01 DIAGNOSIS — Z01812 Encounter for preprocedural laboratory examination: Secondary | ICD-10-CM | POA: Diagnosis present

## 2020-05-01 DIAGNOSIS — Z20822 Contact with and (suspected) exposure to covid-19: Secondary | ICD-10-CM | POA: Insufficient documentation

## 2020-05-01 LAB — SARS CORONAVIRUS 2 (TAT 6-24 HRS): SARS Coronavirus 2: NEGATIVE

## 2020-05-03 MED ORDER — BUPIVACAINE LIPOSOME 1.3 % IJ SUSP
20.0000 mL | Freq: Once | INTRAMUSCULAR | Status: DC
  Filled 2020-05-03: qty 20

## 2020-05-03 NOTE — H&P (Signed)
TOTAL KNEE ADMISSION H&P  Patient is being admitted for left total knee arthroplasty.  Subjective:  Chief Complaint: Left knee pain.  HPI: Erika Ramsey, 75 y.o. female has a history of pain and functional disability in the left knee due to arthritis and has failed non-surgical conservative treatments for greater than 12 weeks to include NSAID's and/or analgesics and activity modification. Onset of symptoms was gradual, starting several years ago with gradually worsening course since that time. The patient noted no past surgery on the left knee.  Patient currently rates pain in the left knee at 5 out of 10 with activity. Patient has worsening of pain with activity and weight bearing and pain that interferes with activities of daily living. Patient has evidence of joint space narrowing by imaging studies. There is no active infection.  Patient Active Problem List   Diagnosis Date Noted  . OA (osteoarthritis) of knee 01/27/2020  . Primary osteoarthritis of right knee 01/27/2020  . Contact dermatitis 01/06/2020  . 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 Chronic low back pain 06/18/2015  . History of cervical dysplasia 08/07/2012  . Osteopenia   . Atrophic vaginitis   . Hypertension     Past Medical History:  Diagnosis Date  . Arthritis    hips back, knees.hand  . Atrophic vaginitis   . Breast cyst   . 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 Decreased libido   . Hypertension   . Neuromuscular disorder (HCC)    tremors  . Osteopenia 08/2017   T score -1.6 FRAX 11% / 2% stable from prior DEXA  . Pneumonia    HX OF   . Shingles     Past Surgical History:  Procedure Laterality Date  . BREAST CYST ASPIRATION    . COLPOSCOPY  1977   CIN 3  . HAND SURGERY    . KNEE SURGERY  2019  . PELVIC LAPAROSCOPY     Diag Lap  . TOTAL HIP ARTHROPLASTY Right 05/04/2016   Procedure: RIGHT TOTAL HIP ARTHROPLASTY ANTERIOR APPROACH;  Surgeon: [name], MD;  Location: [facility];  Service:  Orthopedics;  Laterality: Right;  . TOTAL KNEE ARTHROPLASTY Right 01/27/2020   Procedure: TOTAL KNEE ARTHROPLASTY;  Surgeon: [name], MD;  Location: [facility];  Service: Orthopedics;  Laterality: Right;  35min    Prior to Admission medications   Medication Sig Start Date End Date Taking? Authorizing Provider  acetaminophen (TYLENOL) 500 MG tablet Take 500 mg by mouth every 6 (six) hours as needed for moderate pain or headache.   Yes [provider]  amLODipine (NORVASC) 5 MG tablet Take 1 tablet (5 mg total) by mouth daily. 07/07/15  Yes Shawnee Knapp, MD  Calcium Carb-Cholecalciferol (CALCIUM 600 + D PO) Take 1 tablet by mouth daily.   Yes [provider]  chlorthalidone (HYGROTON) 25 MG tablet Take 12.5 mg by mouth daily.   Yes [provider]  guaiFENesin (MUCINEX) 600 MG 12 hr tablet Take 600 mg by mouth 2 (two) times daily as needed (congestion).   Yes [provider]  losartan (COZAAR) 100 MG tablet Take 1 tablet (100 mg total) by mouth daily. 04/08/15  Yes Copland, Gay Filler, MD  magnesium gluconate (MAGONATE) 500 MG tablet Take 500 mg by mouth daily.   Yes [provider]  Multiple Vitamin (MULTIVITAMIN) tablet Take 1 tablet by mouth daily.   Yes [provider]  Turmeric 500 MG CAPS Take 500 mg by mouth daily.  Yes [provider]  gabapentin (NEURONTIN) 300 MG capsule Take a 300 mg capsule three times a day for two weeks following surgery.Then take a 300 mg capsule two times a day for two weeks. Then take a 300 mg capsule once a day for two weeks. Then discontinue. Patient not taking: Reported on 04/16/2020 01/28/20   Edmisten, Ok Anis, PA  methocarbamol (ROBAXIN) 500 MG tablet Take 1 tablet (500 mg total) by mouth every 6 (six) hours as needed for muscle spasms. Patient not taking: Reported on 04/16/2020 01/28/20   Edmisten, Drue Dun L, PA  oxyCODONE (OXY IR/ROXICODONE) 5 MG immediate release tablet Take 1-2 tablets (5-10 mg  total) by mouth every 6 (six) hours as needed for severe pain. Patient not taking: Reported on 04/16/2020 01/28/20   Edmisten, Ok Anis, PA  traMADol (ULTRAM) 50 MG tablet Take 1 tablet (50 mg total) by mouth every 6 (six) hours as needed for moderate pain. Patient not taking: Reported on 04/16/2020 01/28/20   Derl Barrow, PA    Allergies  Allergen Reactions  . Pollen Extract   . Tomato     Cheek swells when eating a large amount     Social History   Socioeconomic History  . Marital status: Divorced    Spouse name: Not on file  . Number of children: Not on file  . Years of education: Not on file  . Highest education level: Not on file  Occupational History  . Not on file  Tobacco Use  . Smoking status: Former Smoker    Quit date: 1979    Years since quitting: 43.2  . Smokeless tobacco: Never Used  Vaping Use  . Vaping Use: Never used  Substance and Sexual Activity  . Alcohol use: Yes    Alcohol/week: 7.0 standard drinks    Types: 6 Standard drinks or equivalent, 1 Cans of beer per week    Comment: 1 BEER DAILY   . Drug use: No  . Sexual activity: Not Currently    Partners: Male    Birth control/protection: Post-menopausal    Comment: intercourse age 51, sexual partners less than 5  Other Topics Concern  . Not on file  Social History Narrative  . Not on file   Social Determinants of Health   Financial Resource Strain: Not on file  Food Insecurity: Not on file  Transportation Needs: Not on file  Physical Activity: Not on file  Stress: Not on file  Social Connections: Not on file  Intimate Partner Violence: Not on file    Tobacco Use: Medium Risk  . Smoking Tobacco Use: Former Smoker  . Smokeless Tobacco Use: Never Used   Social History   Substance and Sexual Activity  Alcohol Use Yes  . Alcohol/week: 7.0 standard drinks  . Types: 6 Standard drinks or equivalent, 1 Cans of beer per week   Comment: 1 BEER DAILY     Family History  Problem  Relation Age of Onset  . Hypertension Mother   . Heart disease Mother   . Cancer Father        Colon cancer  . Heart disease Father   . Allergic rhinitis Father   . Diabetes Maternal Grandmother   . Cancer Paternal Grandfather        Colon cancer  . Breast cancer Maternal Aunt        Great aunt 29's  . Allergic rhinitis Sister   . Angioedema Neg Hx   . Asthma Neg Hx   .  Atopy Neg Hx   . Eczema Neg Hx   . Immunodeficiency Neg Hx   . Urticaria Neg Hx     ROS:  Constitutional Constitutional: no fever, no chills, no night sweats, no significant weight loss  Cardiovascular Cardiovascular: no chest pain, no palpitations  Respiratory Respiratory: no cough, no shortness of breath, No COPD  Gastrointestinal Gastrointestinal: no vomiting, no nausea  Musculoskeletal Musculoskeletal: no swelling in Joints, Joint Pain  Neurologic Neurologic: no numbness, no tingling, no difficulty with balance  Objective:  Physical Exam: Well nourished and well developed.  General: Alert and oriented x3, cooperative and pleasant, no acute distress.  Head: normocephalic, atraumatic, neck supple.  Eyes: EOMI.  Respiratory: breath sounds clear in all fields, no wheezing, rales, or rhonchi. Cardiovascular: Regular rate and rhythm, no murmurs, gallops or rubs.  Abdomen: non-tender to palpation and soft, normoactive bowel sounds. Musculoskeletal:  Right Knee Exam:  No swelling.  Well healed incision, dry and clean with no drainage or tenderness.  AROM 3-135  No lateral instability with knee in 30 degrees of flexion or full extension.  Minimal AP play.       Left Knee Exam:  No swelling.  Well healed incision, dry and clean with no drainage or tenderness.  AROM 0-135  Mild lateral instability with knee in 30 degrees of flexion. No lateral instability present with full extension.  Minimal AP play.  Calves soft and nontender. Motor function intact in LE. Strength 5/5 LE  bilaterally. Neuro: Distal pulses 2+. Sensation to light touch intact in LE.              Vital signs in last 24 hours:    Imaging Review AP and lateral x-rays of the left knee dated 11/14/2019 demonstrate bone-on-bone arthritis in the medial compartment of the left knee. Patient also has significant patellofemoral narrowing where it is close to bone-on-bone.  Assessment/Plan:  End stage arthritis, left knee   The patient history, physical examination, clinical judgment of the provider and imaging studies are consistent with end stage degenerative joint disease of the left knee and total knee arthroplasty is deemed medically necessary. The treatment options including medical management, injection therapy arthroscopy and arthroplasty were discussed at length. The risks and benefits of total knee arthroplasty were presented and reviewed. The risks due to aseptic loosening, infection, stiffness, patella tracking problems, thromboembolic complications and other imponderables were discussed. The patient acknowledged the explanation, agreed to proceed with the plan and consent was signed. Patient is being admitted for inpatient treatment for surgery, pain control, PT, OT, prophylactic antibiotics, VTE prophylaxis, progressive ambulation and ADLs and discharge planning. The patient is planning to be discharged home.   Patient's anticipated LOS is less than 2 midnights, meeting these requirements: - Younger than 39 - Lives within 1 hour of care - Has a competent adult at home to recover with post-op recover - NO history of  - Chronic pain requiring opiods  - Diabetes  - Coronary Artery Disease  - Heart failure  - Heart attack  - Stroke  - DVT/VTE  - Cardiac arrhythmia  - Respiratory Failure/COPD  - Renal failure  - Anemia  - Advanced Liver disease       Therapy Plans: [facility] Disposition: Home with sister Planned DVT Prophylaxis: Aspirin 325mg  BID DME Needed: None PCP: Horald Pollen, MD (Clearance received 01/09/2021) TXA: IV Allergies: NKDA Anesthesia Concerns: None BMI: 24.2 Last HgbA1c:   Pharmacy: [facility]  - Patient was instructed on what  medications to stop prior to surgery. - Follow-up visit in 2 weeks with Dr. Wynelle Link - Begin physical therapy following surgery - Pre-operative lab work as pre-surgical testing - Prescriptions will be provided in hospital at time of discharge  Fenton Foy, [facility], PA-C Orthopedic Surgery [facility] [place]

## 2020-05-04 ENCOUNTER — Observation Stay (HOSPITAL_COMMUNITY): Payer: Medicare PPO | Admitting: Anesthesiology

## 2020-05-04 ENCOUNTER — Other Ambulatory Visit: Payer: Self-pay

## 2020-05-04 ENCOUNTER — Encounter (HOSPITAL_COMMUNITY)
Admission: RE | Disposition: A | Discharge status: Home / Self Care | Payer: Self-pay | Source: Ambulatory Visit | Attending: Orthopedic Surgery

## 2020-05-04 ENCOUNTER — Encounter (HOSPITAL_COMMUNITY): Payer: Self-pay | Admitting: Orthopedic Surgery

## 2020-05-04 ENCOUNTER — Observation Stay (HOSPITAL_COMMUNITY)
Admission: RE | Admit: 2020-05-04 | Discharge: 2020-05-05 | Disposition: A | Discharge status: Home / Self Care | Payer: Medicare PPO | Source: Ambulatory Visit | Attending: Orthopedic Surgery | Admitting: Orthopedic Surgery

## 2020-05-04 DIAGNOSIS — Z79899 Other long term (current) drug therapy: Secondary | ICD-10-CM | POA: Insufficient documentation

## 2020-05-04 DIAGNOSIS — Z7982 Long term (current) use of aspirin: Secondary | ICD-10-CM | POA: Insufficient documentation

## 2020-05-04 DIAGNOSIS — I10 Essential (primary) hypertension: Secondary | ICD-10-CM | POA: Insufficient documentation

## 2020-05-04 DIAGNOSIS — M1712 Unilateral primary osteoarthritis, left knee: Principal | ICD-10-CM | POA: Insufficient documentation

## 2020-05-04 DIAGNOSIS — Z96641 Presence of right artificial hip joint: Secondary | ICD-10-CM | POA: Diagnosis not present

## 2020-05-04 DIAGNOSIS — Z87891 Personal history of nicotine dependence: Secondary | ICD-10-CM | POA: Diagnosis not present

## 2020-05-04 DIAGNOSIS — Z96651 Presence of right artificial knee joint: Secondary | ICD-10-CM | POA: Insufficient documentation

## 2020-05-04 HISTORY — PX: TOTAL KNEE ARTHROPLASTY: SHX125

## 2020-05-04 LAB — TYPE AND SCREEN
ABO/RH(D): A NEG
Antibody Screen: NEGATIVE

## 2020-05-04 SURGERY — ARTHROPLASTY, KNEE, TOTAL
Anesthesia: Spinal | Site: Knee | Laterality: Left

## 2020-05-04 MED ORDER — PROPOFOL 10 MG/ML IV BOLUS
INTRAVENOUS | Status: DC | PRN
  Administered 2020-05-04: 20 mg via INTRAVENOUS
  Administered 2020-05-04: 10 mg via INTRAVENOUS
  Administered 2020-05-04 (×2): 20 mg via INTRAVENOUS

## 2020-05-04 MED ORDER — CHLORTHALIDONE 25 MG PO TABS
12.5000 mg | ORAL_TABLET | Freq: Every day | ORAL | Status: DC
  Administered 2020-05-05: 12.5 mg via ORAL
  Filled 2020-05-04: qty 1

## 2020-05-04 MED ORDER — DEXAMETHASONE SODIUM PHOSPHATE 10 MG/ML IJ SOLN
INTRAMUSCULAR | Status: DC | PRN
  Administered 2020-05-04: 10 mg

## 2020-05-04 MED ORDER — HYDROMORPHONE HCL 1 MG/ML IJ SOLN: 0.2500 mg | INTRAMUSCULAR | Status: DC | PRN

## 2020-05-04 MED ORDER — SODIUM CHLORIDE 0.9 % IR SOLN
Status: DC | PRN
  Administered 2020-05-04: 1000 mL

## 2020-05-04 MED ORDER — LOSARTAN POTASSIUM 50 MG PO TABS
100.0000 mg | ORAL_TABLET | Freq: Every day | ORAL | Status: DC
  Administered 2020-05-05: 100 mg via ORAL
  Filled 2020-05-04: qty 2

## 2020-05-04 MED ORDER — LACTATED RINGERS IV BOLUS: 250.0000 mL | Freq: Once | INTRAVENOUS | Status: DC

## 2020-05-04 MED ORDER — ONDANSETRON HCL 4 MG/2ML IJ SOLN: 4.0000 mg | Freq: Four times a day (QID) | INTRAMUSCULAR | Status: DC | PRN

## 2020-05-04 MED ORDER — FENTANYL CITRATE (PF) 100 MCG/2ML IJ SOLN
50.0000 ug | INTRAMUSCULAR | Status: DC
  Administered 2020-05-04: 50 ug via INTRAVENOUS
  Filled 2020-05-04: qty 2

## 2020-05-04 MED ORDER — 0.9 % SODIUM CHLORIDE (POUR BTL) OPTIME
TOPICAL | Status: DC | PRN
  Administered 2020-05-04: 1000 mL

## 2020-05-04 MED ORDER — ROPIVACAINE HCL 5 MG/ML IJ SOLN
INTRAMUSCULAR | Status: DC | PRN
  Administered 2020-05-04: 30 mL via PERINEURAL

## 2020-05-04 MED ORDER — DOCUSATE SODIUM 100 MG PO CAPS
100.0000 mg | ORAL_CAPSULE | Freq: Two times a day (BID) | ORAL | Status: DC
  Administered 2020-05-04 – 2020-05-05 (×2): 100 mg via ORAL
  Filled 2020-05-04 (×2): qty 1

## 2020-05-04 MED ORDER — ONDANSETRON HCL 4 MG/2ML IJ SOLN
INTRAMUSCULAR | Status: DC | PRN
  Administered 2020-05-04: 4 mg via INTRAVENOUS

## 2020-05-04 MED ORDER — DEXAMETHASONE SODIUM PHOSPHATE 10 MG/ML IJ SOLN
10.0000 mg | Freq: Once | INTRAMUSCULAR | Status: AC
  Administered 2020-05-05: 10 mg via INTRAVENOUS
  Filled 2020-05-04: qty 1

## 2020-05-04 MED ORDER — GABAPENTIN 300 MG PO CAPS
300.0000 mg | ORAL_CAPSULE | Freq: Three times a day (TID) | ORAL | Status: DC
  Administered 2020-05-04 – 2020-05-05 (×2): 300 mg via ORAL
  Filled 2020-05-04 (×2): qty 1

## 2020-05-04 MED ORDER — FLEET ENEMA 7-19 GM/118ML RE ENEM: 1.0000 | ENEMA | Freq: Once | RECTAL | Status: DC | PRN

## 2020-05-04 MED ORDER — METHOCARBAMOL 500 MG PO TABS
500.0000 mg | ORAL_TABLET | Freq: Four times a day (QID) | ORAL | Status: DC | PRN
  Administered 2020-05-04 – 2020-05-05 (×2): 500 mg via ORAL
  Filled 2020-05-04 (×2): qty 1

## 2020-05-04 MED ORDER — PHENYLEPHRINE HCL-NACL 10-0.9 MG/250ML-% IV SOLN
INTRAVENOUS | Status: DC | PRN
  Administered 2020-05-04: 30 ug/min via INTRAVENOUS

## 2020-05-04 MED ORDER — AMLODIPINE BESYLATE 5 MG PO TABS
5.0000 mg | ORAL_TABLET | Freq: Every day | ORAL | Status: DC
  Administered 2020-05-05: 5 mg via ORAL
  Filled 2020-05-04: qty 1

## 2020-05-04 MED ORDER — FENTANYL CITRATE (PF) 100 MCG/2ML IJ SOLN
INTRAMUSCULAR | Status: AC
  Filled 2020-05-04: qty 2

## 2020-05-04 MED ORDER — MIDAZOLAM HCL 2 MG/2ML IJ SOLN
1.0000 mg | INTRAMUSCULAR | Status: DC
  Administered 2020-05-04: 1 mg via INTRAVENOUS
  Filled 2020-05-04: qty 2

## 2020-05-04 MED ORDER — BUPIVACAINE IN DEXTROSE 0.75-8.25 % IT SOLN
INTRATHECAL | Status: DC | PRN
  Administered 2020-05-04: 1.4 mL via INTRATHECAL

## 2020-05-04 MED ORDER — ONDANSETRON HCL 4 MG PO TABS: 4.0000 mg | ORAL_TABLET | Freq: Four times a day (QID) | ORAL | Status: DC | PRN

## 2020-05-04 MED ORDER — PHENOL 1.4 % MT LIQD: 1.0000 | OROMUCOSAL | Status: DC | PRN

## 2020-05-04 MED ORDER — POLYETHYLENE GLYCOL 3350 17 G PO PACK: 17.0000 g | PACK | Freq: Every day | ORAL | Status: DC | PRN

## 2020-05-04 MED ORDER — STERILE WATER FOR IRRIGATION IR SOLN
Status: DC | PRN
  Administered 2020-05-04: 2000 mL

## 2020-05-04 MED ORDER — ONDANSETRON HCL 4 MG/2ML IJ SOLN: 4.0000 mg | Freq: Once | INTRAMUSCULAR | Status: DC | PRN

## 2020-05-04 MED ORDER — SODIUM CHLORIDE (PF) 0.9 % IJ SOLN
INTRAMUSCULAR | Status: AC
  Filled 2020-05-04: qty 10

## 2020-05-04 MED ORDER — PROPOFOL 500 MG/50ML IV EMUL
INTRAVENOUS | Status: DC | PRN
  Administered 2020-05-04: 40 ug/kg/min via INTRAVENOUS

## 2020-05-04 MED ORDER — ACETAMINOPHEN 10 MG/ML IV SOLN
1000.0000 mg | Freq: Four times a day (QID) | INTRAVENOUS | Status: DC
  Administered 2020-05-04: 1000 mg via INTRAVENOUS
  Filled 2020-05-04: qty 100

## 2020-05-04 MED ORDER — OXYCODONE HCL 5 MG PO TABS: 5.0000 mg | ORAL_TABLET | Freq: Once | ORAL | Status: DC | PRN

## 2020-05-04 MED ORDER — CHLORHEXIDINE GLUCONATE 0.12 % MT SOLN
15.0000 mL | Freq: Once | OROMUCOSAL | Status: AC
  Administered 2020-05-04: 15 mL via OROMUCOSAL

## 2020-05-04 MED ORDER — CEFAZOLIN SODIUM-DEXTROSE 2-4 GM/100ML-% IV SOLN
2.0000 g | Freq: Four times a day (QID) | INTRAVENOUS | Status: AC
  Administered 2020-05-04 – 2020-05-05 (×2): 2 g via INTRAVENOUS
  Filled 2020-05-04 (×2): qty 100

## 2020-05-04 MED ORDER — METOCLOPRAMIDE HCL 5 MG PO TABS: 5.0000 mg | ORAL_TABLET | Freq: Three times a day (TID) | ORAL | Status: DC | PRN

## 2020-05-04 MED ORDER — LACTATED RINGERS IV SOLN: INTRAVENOUS | Status: DC

## 2020-05-04 MED ORDER — ACETAMINOPHEN 500 MG PO TABS
1000.0000 mg | ORAL_TABLET | Freq: Four times a day (QID) | ORAL | Status: DC
  Administered 2020-05-05 (×2): 1000 mg via ORAL
  Filled 2020-05-04 (×2): qty 2

## 2020-05-04 MED ORDER — OXYCODONE HCL 5 MG/5ML PO SOLN
5.0000 mg | Freq: Once | ORAL | Status: DC | PRN
Start: 2020-05-04 — End: 2020-05-04

## 2020-05-04 MED ORDER — CEFAZOLIN SODIUM-DEXTROSE 2-4 GM/100ML-% IV SOLN
2.0000 g | INTRAVENOUS | Status: AC
  Administered 2020-05-04: 2 g via INTRAVENOUS
  Filled 2020-05-04: qty 100

## 2020-05-04 MED ORDER — MORPHINE SULFATE (PF) 2 MG/ML IV SOLN: 0.5000 mg | INTRAVENOUS | Status: DC | PRN

## 2020-05-04 MED ORDER — DEXAMETHASONE SODIUM PHOSPHATE 10 MG/ML IJ SOLN
8.0000 mg | Freq: Once | INTRAMUSCULAR | Status: AC
  Administered 2020-05-04: 8 mg via INTRAVENOUS

## 2020-05-04 MED ORDER — LACTATED RINGERS IV BOLUS: 500.0000 mL | Freq: Once | INTRAVENOUS | Status: DC

## 2020-05-04 MED ORDER — TRANEXAMIC ACID-NACL 1000-0.7 MG/100ML-% IV SOLN
1000.0000 mg | INTRAVENOUS | Status: AC
  Administered 2020-05-04: 1000 mg via INTRAVENOUS
  Filled 2020-05-04: qty 100

## 2020-05-04 MED ORDER — METHOCARBAMOL 1000 MG/10ML IJ SOLN
500.0000 mg | Freq: Four times a day (QID) | INTRAVENOUS | Status: DC | PRN
  Filled 2020-05-04: qty 5

## 2020-05-04 MED ORDER — FENTANYL CITRATE (PF) 100 MCG/2ML IJ SOLN
INTRAMUSCULAR | Status: DC | PRN
  Administered 2020-05-04 (×2): 50 ug via INTRAVENOUS

## 2020-05-04 MED ORDER — ASPIRIN EC 325 MG PO TBEC
325.0000 mg | DELAYED_RELEASE_TABLET | Freq: Two times a day (BID) | ORAL | Status: DC
  Administered 2020-05-05: 325 mg via ORAL
  Filled 2020-05-04: qty 1

## 2020-05-04 MED ORDER — BUPIVACAINE LIPOSOME 1.3 % IJ SUSP
INTRAMUSCULAR | Status: DC | PRN
  Administered 2020-05-04: 20 mL

## 2020-05-04 MED ORDER — SODIUM CHLORIDE 0.9 % IV SOLN: INTRAVENOUS | Status: DC

## 2020-05-04 MED ORDER — TRAMADOL HCL 50 MG PO TABS
50.0000 mg | ORAL_TABLET | Freq: Four times a day (QID) | ORAL | Status: DC | PRN
  Administered 2020-05-04 – 2020-05-05 (×2): 100 mg via ORAL
  Filled 2020-05-04 (×2): qty 2

## 2020-05-04 MED ORDER — MENTHOL 3 MG MT LOZG: 1.0000 | LOZENGE | OROMUCOSAL | Status: DC | PRN

## 2020-05-04 MED ORDER — BISACODYL 10 MG RE SUPP: 10.0000 mg | Freq: Every day | RECTAL | Status: DC | PRN

## 2020-05-04 MED ORDER — SODIUM CHLORIDE (PF) 0.9 % IJ SOLN
INTRAMUSCULAR | Status: DC | PRN
  Administered 2020-05-04: 60 mL

## 2020-05-04 MED ORDER — POVIDONE-IODINE 10 % EX SWAB
2.0000 "application " | Freq: Once | CUTANEOUS | Status: AC
  Administered 2020-05-04: 2 via TOPICAL

## 2020-05-04 MED ORDER — METOCLOPRAMIDE HCL 5 MG/ML IJ SOLN
5.0000 mg | Freq: Three times a day (TID) | INTRAMUSCULAR | Status: DC | PRN
Start: 2020-05-04 — End: 2020-05-05

## 2020-05-04 MED ORDER — OXYCODONE HCL 5 MG PO TABS
5.0000 mg | ORAL_TABLET | ORAL | Status: DC | PRN
  Filled 2020-05-04: qty 2

## 2020-05-04 MED ORDER — ORAL CARE MOUTH RINSE: 15.0000 mL | Freq: Once | OROMUCOSAL | Status: AC

## 2020-05-04 MED ORDER — DIPHENHYDRAMINE HCL 12.5 MG/5ML PO ELIX
12.5000 mg | ORAL_SOLUTION | ORAL | Status: DC | PRN
  Administered 2020-05-05: 25 mg via ORAL
  Filled 2020-05-04: qty 10

## 2020-05-04 SURGICAL SUPPLY — 53 items
ATTUNE PSFEM LTSZ4 NARCEM KNEE (Femur) ×2 IMPLANT
ATTUNE PSRP INSR SZ4 7 KNEE (Insert) ×2 IMPLANT
BAG ZIPLOCK 12X15 (MISCELLANEOUS) ×2 IMPLANT
BASE TIBIAL ROT PLAT SZ 3 KNEE (Knees) ×1 IMPLANT
BLADE SAG 18X100X1.27 (BLADE) ×2 IMPLANT
BLADE SAW SGTL 11.0X1.19X90.0M (BLADE) ×2 IMPLANT
BNDG ELASTIC 6X5.8 VLCR STR LF (GAUZE/BANDAGES/DRESSINGS) ×2 IMPLANT
BOWL SMART MIX CTS (DISPOSABLE) ×2 IMPLANT
CEMENT HV SMART SET (Cement) ×4 IMPLANT
CLSR STERI-STRIP ANTIMIC 1/2X4 (GAUZE/BANDAGES/DRESSINGS) ×2 IMPLANT
COVER SURGICAL LIGHT HANDLE (MISCELLANEOUS) ×2 IMPLANT
COVER WAND RF STERILE (DRAPES) IMPLANT
CUFF TOURN SGL QUICK 34 (TOURNIQUET CUFF) ×2
CUFF TRNQT CYL 34X4.125X (TOURNIQUET CUFF) ×1 IMPLANT
DECANTER SPIKE VIAL GLASS SM (MISCELLANEOUS) ×2 IMPLANT
DRAPE U-SHAPE 47X51 STRL (DRAPES) ×2 IMPLANT
DRSG AQUACEL AG ADV 3.5X10 (GAUZE/BANDAGES/DRESSINGS) ×2 IMPLANT
DURAPREP 26ML APPLICATOR (WOUND CARE) ×2 IMPLANT
ELECT REM PT RETURN 15FT ADLT (MISCELLANEOUS) ×2 IMPLANT
GLOVE SRG 8 PF TXTR STRL LF DI (GLOVE) ×1 IMPLANT
GLOVE SURG ENC MOIS LTX SZ6 (GLOVE) IMPLANT
GLOVE SURG ENC MOIS LTX SZ7 (GLOVE) ×2 IMPLANT
GLOVE SURG ENC MOIS LTX SZ8 (GLOVE) ×2 IMPLANT
GLOVE SURG UNDER POLY LF SZ6.5 (GLOVE) ×2 IMPLANT
GLOVE SURG UNDER POLY LF SZ8 (GLOVE) ×2
GLOVE SURG UNDER POLY LF SZ8.5 (GLOVE) ×2 IMPLANT
GOWN STRL REUS W/TWL LRG LVL3 (GOWN DISPOSABLE) ×4 IMPLANT
GOWN STRL REUS W/TWL XL LVL3 (GOWN DISPOSABLE) ×2 IMPLANT
HANDPIECE INTERPULSE COAX TIP (DISPOSABLE) ×2
HOLDER FOLEY CATH W/STRAP (MISCELLANEOUS) IMPLANT
IMMOBILIZER KNEE 20 (SOFTGOODS) ×2
IMMOBILIZER KNEE 20 THIGH 36 (SOFTGOODS) ×1 IMPLANT
KIT TURNOVER KIT A (KITS) ×2 IMPLANT
MANIFOLD NEPTUNE II (INSTRUMENTS) ×2 IMPLANT
NS IRRIG 1000ML POUR BTL (IV SOLUTION) ×2 IMPLANT
PACK TOTAL KNEE CUSTOM (KITS) ×2 IMPLANT
PADDING CAST COTTON 6X4 STRL (CAST SUPPLIES) ×2 IMPLANT
PATELLA MEDIAL ATTUN 35MM KNEE (Knees) ×2 IMPLANT
PENCIL SMOKE EVACUATOR (MISCELLANEOUS) ×2 IMPLANT
PIN DRILL FIX HALF THREAD (BIT) ×2 IMPLANT
PIN STEINMAN FIXATION KNEE (PIN) ×2 IMPLANT
PROTECTOR NERVE ULNAR (MISCELLANEOUS) ×2 IMPLANT
SET HNDPC FAN SPRY TIP SCT (DISPOSABLE) ×1 IMPLANT
STRIP CLOSURE SKIN 1/2X4 (GAUZE/BANDAGES/DRESSINGS) ×4 IMPLANT
SUT MNCRL AB 4-0 PS2 18 (SUTURE) ×2 IMPLANT
SUT STRATAFIX 0 PDS 27 VIOLET (SUTURE) ×2
SUT VIC AB 2-0 CT1 27 (SUTURE) ×6
SUT VIC AB 2-0 CT1 TAPERPNT 27 (SUTURE) ×3 IMPLANT
SUTURE STRATFX 0 PDS 27 VIOLET (SUTURE) ×1 IMPLANT
TIBIAL BASE ROT PLAT SZ 3 KNEE (Knees) ×2 IMPLANT
TRAY FOLEY MTR SLVR 16FR STAT (SET/KITS/TRAYS/PACK) ×2 IMPLANT
WATER STERILE IRR 1000ML POUR (IV SOLUTION) ×4 IMPLANT
WRAP KNEE MAXI GEL POST OP (GAUZE/BANDAGES/DRESSINGS) ×2 IMPLANT

## 2020-05-04 NOTE — Anesthesia Postprocedure Evaluation (Signed)
Anesthesia Post Note  Patient: Erika Ramsey  Procedure(s) Performed: TOTAL KNEE ARTHROPLASTY (Left Knee)     Patient location during evaluation: PACU Anesthesia Type: Spinal, MAC and Regional Level of consciousness: oriented and awake and alert Pain management: pain level controlled Vital Signs Assessment: post-procedure vital signs reviewed and stable Respiratory status: spontaneous breathing and respiratory function stable Cardiovascular status: blood pressure returned to baseline and stable Postop Assessment: no headache, no backache, no apparent nausea or vomiting and spinal receding Anesthetic complications: no   No complications documented.  Last Vitals:  Vitals:   05/04/20 1645 05/04/20 1700  BP: (!) 116/99 (!) 116/97  Pulse: 72 66  Resp: (!) 21 10  Temp:    SpO2: 98% 100%    Last Pain:  Vitals:   05/04/20 1700  TempSrc:   PainSc: 0-No pain                 Pervis Hocking

## 2020-05-04 NOTE — Progress Notes (Signed)
Assisted Dr. Beth Finucane with left, ultrasound guided, adductor canal block. Side rails up, monitors on throughout procedure. See vital signs in flow sheet. Tolerated Procedure well. ° °

## 2020-05-04 NOTE — Anesthesia Procedure Notes (Signed)
Anesthesia Regional Block: Adductor canal block   Pre-Anesthetic Checklist: ,, timeout performed, Correct Patient, Correct Site, Correct Laterality, Correct Procedure, Correct Position, site marked, Risks and benefits discussed,  Surgical consent,  Pre-op evaluation,  At surgeon's request and post-op pain management  Laterality: Left  Prep: Maximum Sterile Barrier Precautions used, chloraprep       Needles:  Injection technique: Single-shot  Needle Type: Echogenic Stimulator Needle     Needle Length: 9cm  Needle Gauge: 22     Additional Needles:   Procedures:,,,, ultrasound used (permanent image in chart),,,,  Narrative:  Start time: 05/04/2020 2:15 PM End time: 05/04/2020 2:20 PM Injection made incrementally with aspirations every 5 mL.  Performed by: Personally  Anesthesiologist: Pervis Hocking, DO  Additional Notes: Monitors applied. No increased pain on injection. No increased resistance to injection. Injection made in 5cc increments. Good needle visualization. Patient tolerated procedure well.

## 2020-05-04 NOTE — Interval H&P Note (Signed)
History and Physical Interval Note:  05/04/2020 12:42 PM  Erika Ramsey  has presented today for surgery, with the diagnosis of left knee osteoarthritis.  The various methods of treatment have been discussed with the patient and family. After consideration of risks, benefits and other options for treatment, the patient has consented to  Procedure(s) with comments: TOTAL KNEE ARTHROPLASTY (Left) - 91min as a surgical intervention.  The patient's history has been reviewed, patient examined, no change in status, stable for surgery.  I have reviewed the patient's chart and labs.  Questions were answered to the patient's satisfaction.     Pilar Plate Bunny Kleist

## 2020-05-04 NOTE — Transfer of Care (Signed)
Immediate Anesthesia Transfer of Care Note  Patient: Erika Ramsey  Procedure(s) Performed: TOTAL KNEE ARTHROPLASTY (Left Knee)  Patient Location: PACU  Anesthesia Type:Spinal  Level of Consciousness: drowsy and patient cooperative  Airway & Oxygen Therapy: Patient Spontanous Breathing and Patient connected to face mask oxygen  Post-op Assessment: Report given to RN and Post -op Vital signs reviewed and stable  Post vital signs: Reviewed and stable  Last Vitals:  Vitals Value Taken Time  BP 101/79 05/04/20 1641  Temp    Pulse 71 05/04/20 1644  Resp 16 05/04/20 1644  SpO2 100 % 05/04/20 1644  Vitals shown include unvalidated device data.  Last Pain:  Vitals:   05/04/20 1245  TempSrc:   PainSc: 0-No pain      Patients Stated Pain Goal: 5 (29/47/65 4650)  Complications: No complications documented.

## 2020-05-04 NOTE — Anesthesia Procedure Notes (Signed)
Spinal  Patient location during procedure: OR Start time: 05/04/2020 2:58 PM End time: 05/04/2020 3:03 PM Staffing Performed: resident/CRNA  Anesthesiologist: Pervis Hocking, DO Resident/CRNA: Montel Clock, CRNA Preanesthetic Checklist Completed: patient identified, IV checked, risks and benefits discussed, surgical consent, monitors and equipment checked, pre-op evaluation and timeout performed Spinal Block Patient position: sitting Prep: DuraPrep Patient monitoring: heart rate, cardiac monitor, continuous pulse ox and blood pressure Approach: midline Location: L3-4 Injection technique: single-shot Needle Needle type: Pencan  Needle gauge: 24 G Needle length: 10 cm Needle insertion depth: 6.5 cm Assessment Sensory level: T6

## 2020-05-04 NOTE — Op Note (Signed)
OPERATIVE REPORT-TOTAL KNEE ARTHROPLASTY   Pre-operative diagnosis- Osteoarthritis  Left knee(s)  Post-operative diagnosis- Osteoarthritis Left knee(s)  Procedure-  Left  Total Knee Arthroplasty  Surgeon- Dione Plover. Symphonie Schneiderman, MD  Assistant- Fenton Foy, PA-C   Anesthesia-  Adductor canal block and spinal  EBL-50 mL   Drains None  Tourniquet time- 34 minutes @ 371 mm Hg  Complications- None  Condition-PACU - hemodynamically stable.   Brief Clinical Note  Erika Ramsey is a 75 y.o. year old female with end stage OA of her left knee with progressively worsening pain and dysfunction. She has constant pain, with activity and at rest and significant functional deficits with difficulties even with ADLs. She has had extensive non-op management including analgesics, injections of cortisone and viscosupplements, and home exercise program, but remains in significant pain with significant dysfunction. Radiographs show bone on bone arthritis medial and patellofemoral. She presents now for left Total Knee Arthroplasty.    Procedure in detail---   The patient is brought into the operating room and positioned supine on the operating table. After successful administration of  Adductor canal block and spinal,   a tourniquet is placed high on the  Left thigh(s) and the lower extremity is prepped and draped in the usual sterile fashion. Time out is performed by the operating team and then the  Left lower extremity is wrapped in Esmarch, knee flexed and the tourniquet inflated to 300 mmHg.       A midline incision is made with a ten blade through the subcutaneous tissue to the level of the extensor mechanism. A fresh blade is used to make a medial parapatellar arthrotomy. Soft tissue over the proximal medial tibia is subperiosteally elevated to the joint line with a knife and into the semimembranosus bursa with a Cobb elevator. Soft tissue over the proximal lateral tibia is elevated with  attention being paid to avoiding the patellar tendon on the tibial tubercle. The patella is everted, knee flexed 90 degrees and the ACL and PCL are removed. Findings are bone on bone medial and patellofemoral with large global osteophytes.        The drill is used to create a starting hole in the distal femur and the canal is thoroughly irrigated with sterile saline to remove the fatty contents. The 5 degree Left  valgus alignment guide is placed into the femoral canal and the distal femoral cutting block is pinned to remove 9 mm off the distal femur. Resection is made with an oscillating saw.      The tibia is subluxed forward and the menisci are removed. The extramedullary alignment guide is placed referencing proximally at the medial aspect of the tibial tubercle and distally along the second metatarsal axis and tibial crest. The block is pinned to remove 1mm off the more deficient medial  side. Resection is made with an oscillating saw. Size 3 is the most appropriate size for the tibia and the proximal tibia is prepared with the modular drill and keel punch for that size.      The femoral sizing guide is placed and size 4 is most appropriate. Rotation is marked off the epicondylar axis and confirmed by creating a rectangular flexion gap at 90 degrees. The size 4 cutting block is pinned in this rotation and the anterior, posterior and chamfer cuts are made with the oscillating saw. The intercondylar block is then placed and that cut is made.      Trial size 3 tibial component, trial size 4  narrow posterior stabilized femur and a 7  mm posterior stabilized rotating platform insert trial is placed. Full extension is achieved with excellent varus/valgus and anterior/posterior balance throughout full range of motion. The patella is everted and thickness measured to be 21  mm. Free hand resection is taken to 12 mm, a 32 template is placed, lug holes are drilled, trial patella is placed, and it tracks normally.  Osteophytes are removed off the posterior femur with the trial in place. All trials are removed and the cut bone surfaces prepared with pulsatile lavage. Cement is mixed and once ready for implantation, the size 3 tibial implant, size  4 narrow posterior stabilized femoral component, and the size 32 patella are cemented in place and the patella is held with the clamp. The trial insert is placed and the knee held in full extension. The Exparel (20 ml mixed with 60 ml saline) is injected into the extensor mechanism, posterior capsule, medial and lateral gutters and subcutaneous tissues.  All extruded cement is removed and once the cement is hard the permanent 7 mm posterior stabilized rotating platform insert is placed into the tibial tray.      The wound is copiously irrigated with saline solution and the extensor mechanism closed with # 0 Stratofix suture. The tourniquet is released for a total tourniquet time of 34  minutes. Flexion against gravity is 140 degrees and the patella tracks normally. Subcutaneous tissue is closed with 2.0 vicryl and subcuticular with running 4.0 Monocryl. The incision is cleaned and dried and steri-strips and a bulky sterile dressing are applied. The limb is placed into a knee immobilizer and the patient is awakened and transported to recovery in stable condition.      Please note that a surgical assistant was a medical necessity for this procedure in order to perform it in a safe and expeditious manner. Surgical assistant was necessary to retract the ligaments and vital neurovascular structures to prevent injury to them and also necessary for proper positioning of the limb to allow for anatomic placement of the prosthesis.   Dione Plover Madigan Rosensteel, MD    05/04/2020, 4:05 PM

## 2020-05-04 NOTE — Progress Notes (Signed)
Orthopedic Tech Progress Note Patient Details:  Erika Ramsey [facility] 12/05/1945 [id number]  Ortho Devices Ortho Device/Splint Location: off cpm   Post Interventions Patient Tolerated: Well Instructions Provided: Care of device   Braulio Bosch 05/04/2020, 8:48 PM

## 2020-05-04 NOTE — Discharge Instructions (Signed)
 Frank Aluisio, MD Total Joint Specialist [facility] [address] [place], [place] [address] [phone]  TOTAL KNEE REPLACEMENT POSTOPERATIVE DIRECTIONS    Knee Rehabilitation, Guidelines Following Surgery  Results after knee surgery are often greatly improved when you follow the exercise, range of motion and muscle strengthening exercises prescribed by your doctor. Safety measures are also important to protect the knee from further injury. If any of these exercises cause you to have increased pain or swelling in your knee joint, decrease the amount until you are comfortable again and slowly increase them. If you have problems or questions, call your caregiver or physical therapist for advice.   BLOOD CLOT PREVENTION . Take a 325 mg Aspirin two times a day for three weeks following surgery. Then take an 81 mg Aspirin once a day for three weeks. Then discontinue Aspirin. . You may resume your vitamins/supplements upon discharge from the hospital. . Do not take any NSAIDs (Advil, Aleve, Ibuprofen, Meloxicam, etc.) until you have discontinued the 325 mg Aspirin.  HOME CARE INSTRUCTIONS  . Remove items at home which could result in a fall. This includes throw rugs or furniture in walking pathways.  . ICE to the affected knee as much as tolerated. Icing helps control swelling. If the swelling is well controlled you will be more comfortable and rehab easier. Continue to use ice on the knee for pain and swelling from surgery. You may notice swelling that will progress down to the foot and ankle. This is normal after surgery. Elevate the leg when you are not up walking on it.    . Continue to use the breathing machine which will help keep your temperature down. It is common for your temperature to cycle up and down following surgery, especially at night when you are not up moving around and exerting yourself. The breathing machine keeps your lungs expanded and your  temperature down. . Do not place pillow under the operative knee, focus on keeping the knee straight while resting  DIET You may resume your previous home diet once you are discharged from the hospital.  DRESSING / WOUND CARE / SHOWERING . Keep your bulky bandage on for 2 days. On the third post-operative day you may remove the Ace bandage and gauze. There is a waterproof adhesive bandage on your skin which will stay in place until your first follow-up appointment. Once you remove this you will not need to place another bandage . You may begin showering 3 days following surgery, but do not submerge the incision under water.  ACTIVITY For the first 5 days, the key is rest and control of pain and swelling . Do your home exercises twice a day starting on post-operative day 3. On the days you go to physical therapy, just do the home exercises once that day. . You should rest, ice and elevate the leg for 50 minutes out of every hour. Get up and walk/stretch for 10 minutes per hour. After 5 days you can increase your activity slowly as tolerated. . Walk with your walker as instructed. Use the walker until you are comfortable transitioning to a cane. Walk with the cane in the opposite hand of the operative leg. You may discontinue the cane once you are comfortable and walking steadily. . Avoid periods of inactivity such as sitting longer than an hour when not asleep. This helps prevent blood clots.  . You may discontinue the knee immobilizer once you are able to perform a straight   leg raise while lying down. . You may resume a sexual relationship in one month or when given the OK by your doctor.  . You may return to work once you are cleared by your doctor.  . Do not drive a car for 6 weeks or until released by your surgeon.  . Do not drive while taking narcotics.  TED HOSE STOCKINGS Wear the elastic stockings on both legs for three weeks following surgery during the day. You may remove them at night  for sleeping.  WEIGHT BEARING Weight bearing as tolerated with assist device (walker, cane, etc) as directed, use it as long as suggested by your surgeon or therapist, typically at least 4-6 weeks.  POSTOPERATIVE CONSTIPATION PROTOCOL Constipation - defined medically as fewer than three stools per week and severe constipation as less than one stool per week.  One of the most common issues patients have following surgery is constipation.  Even if you have a regular bowel pattern at home, your normal regimen is likely to be disrupted due to multiple reasons following surgery.  Combination of anesthesia, postoperative narcotics, change in appetite and fluid intake all can affect your bowels.  In order to avoid complications following surgery, here are some recommendations in order to help you during your recovery period.  . Colace (docusate) - Pick up an over-the-counter form of Colace or another stool softener and take twice a day as long as you are requiring postoperative pain medications.  Take with a full glass of water daily.  If you experience loose stools or diarrhea, hold the colace until you stool forms back up. If your symptoms do not get better within 1 week or if they get worse, check with your doctor. . Dulcolax (bisacodyl) - Pick up over-the-counter and take as directed by the product packaging as needed to assist with the movement of your bowels.  Take with a full glass of water.  Use this product as needed if not relieved by Colace only.  . MiraLax (polyethylene glycol) - Pick up over-the-counter to have on hand. MiraLax is a solution that will increase the amount of water in your bowels to assist with bowel movements.  Take as directed and can mix with a glass of water, juice, soda, coffee, or tea. Take if you go more than two days without a movement. Do not use MiraLax more than once per day. Call your doctor if you are still constipated or irregular after using this medication for 7 days  in a row.  If you continue to have problems with postoperative constipation, please contact the office for further assistance and recommendations.  If you experience "the worst abdominal pain ever" or develop nausea or vomiting, please contact the office immediatly for further recommendations for treatment.  ITCHING If you experience itching with your medications, try taking only a single pain pill, or even half a pain pill at a time.  You can also use Benadryl over the counter for itching or also to help with sleep.   MEDICATIONS See your medication summary on the "After Visit Summary" that the nursing staff will review with you prior to discharge.  You may have some home medications which will be placed on hold until you complete the course of blood thinner medication.  It is important for you to complete the blood thinner medication as prescribed by your surgeon.  Continue your approved medications as instructed at time of discharge.  PRECAUTIONS . If you experience chest pain or shortness of   breath - call 911 immediately for transfer to the hospital emergency department.  . If you develop a fever greater that 101 F, purulent drainage from wound, increased redness or drainage from wound, foul odor from the wound/dressing, or calf pain - CONTACT YOUR SURGEON.                                                   FOLLOW-UP APPOINTMENTS Make sure you keep all of your appointments after your operation with your surgeon and caregivers. You should call the office at the above phone number and make an appointment for approximately two weeks after the date of your surgery or on the date instructed by your surgeon outlined in the "After Visit Summary".  RANGE OF MOTION AND STRENGTHENING EXERCISES  Rehabilitation of the knee is important following a knee injury or an operation. After just a few days of immobilization, the muscles of the thigh which control the knee become weakened and shrink (atrophy). Knee  exercises are designed to build up the tone and strength of the thigh muscles and to improve knee motion. Often times heat used for twenty to thirty minutes before working out will loosen up your tissues and help with improving the range of motion but do not use heat for the first two weeks following surgery. These exercises can be done on a training (exercise) mat, on the floor, on a table or on a bed. Use what ever works the best and is most comfortable for you Knee exercises include:  . Leg Lifts - While your knee is still immobilized in a splint or cast, you can do straight leg raises. Lift the leg to 60 degrees, hold for 3 sec, and slowly lower the leg. Repeat 10-20 times 2-3 times daily. Perform this exercise against resistance later as your knee gets better.  . Quad and Hamstring Sets - Tighten up the muscle on the front of the thigh (Quad) and hold for 5-10 sec. Repeat this 10-20 times hourly. Hamstring sets are done by pushing the foot backward against an object and holding for 5-10 sec. Repeat as with quad sets.   Leg Slides: Lying on your back, slowly slide your foot toward your buttocks, bending your knee up off the floor (only go as far as is comfortable). Then slowly slide your foot back down until your leg is flat on the floor again.  Angel Wings: Lying on your back spread your legs to the side as far apart as you can without causing discomfort.  A rehabilitation program following serious knee injuries can speed recovery and prevent re-injury in the future due to weakened muscles. Contact your doctor or a physical therapist for more information on knee rehabilitation.   IF YOU ARE TRANSFERRED TO A SKILLED REHAB FACILITY If the patient is transferred to a skilled rehab facility following release from the hospital, a list of the current medications will be sent to the facility for the patient to continue.  When discharged from the skilled rehab facility, please have the facility set up the  patient's Home Health Physical Therapy prior to being released. Also, the skilled facility will be responsible for providing the patient with their medications at time of release from the facility to include their pain medication, the muscle relaxants, and their blood thinner medication. If the patient is still at the   rehab facility at time of the two week follow up appointment, the skilled rehab facility will also need to assist the patient in arranging follow up appointment in our office and any transportation needs.  MAKE SURE YOU:  . Understand these instructions.  . Get help right away if you are not doing well or get worse.   DENTAL ANTIBIOTICS:  In most cases prophylactic antibiotics for Dental procdeures after total joint surgery are not necessary.  Exceptions are as follows:  1. History of prior total joint infection  2. Severely immunocompromised (Organ Transplant, cancer chemotherapy, Rheumatoid biologic meds such as Humera)  3. Poorly controlled diabetes (A1C &gt; 8.0, blood glucose over 200)  If you have one of these conditions, contact your surgeon for an antibiotic prescription, prior to your dental procedure.    Pick up stool softner and laxative for home use following surgery while on pain medications. Do not submerge incision under water. Please use good hand washing techniques while changing dressing each day. May shower starting three days after surgery. Please use a clean towel to pat the incision dry following showers. Continue to use ice for pain and swelling after surgery. Do not use any lotions or creams on the incision until instructed by your surgeon.  

## 2020-05-04 NOTE — Progress Notes (Signed)
Orthopedic Tech Progress Note Patient Details:  Erika Ramsey [facility] 02/27/1946 [id number]  CPM Left Knee CPM Left Knee: On Left Knee Flexion (Degrees): 40 Left Knee Extension (Degrees): 10  Post Interventions Patient Tolerated: Well Instructions Provided: Care of device  Erika Ramsey 05/04/2020, 5:08 PM

## 2020-05-04 NOTE — Anesthesia Preprocedure Evaluation (Addendum)
Anesthesia Evaluation  Patient identified by MRN, date of birth, ID band Patient awake    Reviewed: Allergy & Precautions, NPO status , Patient's Chart, lab work & pertinent test results  Airway Mallampati: I  TM Distance: >3 FB Neck ROM: Full    Dental no notable dental hx. (+) Teeth Intact, Dental Advisory Given   Pulmonary former smoker,  Former smoker, quit smoking 1979   Pulmonary exam normal breath sounds clear to auscultation       Cardiovascular hypertension, Pt. on medications Normal cardiovascular exam Rhythm:Regular Rate:Normal     Neuro/Psych  Neuromuscular disease (tremor) negative psych ROS   GI/Hepatic negative GI ROS, (+)     substance abuse  alcohol use,   Endo/Other  negative endocrine ROS  Renal/GU negative Renal ROS  negative genitourinary   Musculoskeletal  (+) Arthritis , Osteoarthritis,  L knee OA  Right knee done under spinal in 12/2019   Abdominal   Peds  Hematology negative hematology ROS (+) hct 45.2, plt 245   Anesthesia Other Findings   Reproductive/Obstetrics negative OB ROS                            Anesthesia Physical Anesthesia Plan  ASA: II  Anesthesia Plan: Spinal   Post-op Pain Management:    Induction:   PONV Risk Score and Plan: 2 and Propofol infusion and TIVA  Airway Management Planned: Natural Airway and Nasal Cannula  Additional Equipment: None  Intra-op Plan:   Post-operative Plan:   Informed Consent: I have reviewed the patients History and Physical, chart, labs and discussed the procedure including the risks, benefits and alternatives for the proposed anesthesia with the patient or authorized representative who has indicated his/her understanding and acceptance.       Plan Discussed with: CRNA  Anesthesia Plan Comments:        Anesthesia Quick Evaluation

## 2020-05-05 ENCOUNTER — Encounter (HOSPITAL_COMMUNITY): Payer: Self-pay | Admitting: Orthopedic Surgery

## 2020-05-05 DIAGNOSIS — M1712 Unilateral primary osteoarthritis, left knee: Secondary | ICD-10-CM | POA: Diagnosis not present

## 2020-05-05 LAB — BASIC METABOLIC PANEL
Anion gap: 9 (ref 5–15)
BUN: 18 mg/dL (ref 8–23)
CO2: 23 mmol/L (ref 22–32)
Calcium: 8.2 mg/dL — ABNORMAL LOW (ref 8.9–10.3)
Chloride: 105 mmol/L (ref 98–111)
Creatinine, Ser: 0.92 mg/dL (ref 0.44–1.00)
GFR, Estimated: >60 mL/min (ref >60)
Glucose, Bld: 181 mg/dL — ABNORMAL HIGH (ref 70–99)
Potassium: 3.4 mmol/L — ABNORMAL LOW (ref 3.5–5.1)
Sodium: 137 mmol/L (ref 135–145)

## 2020-05-05 LAB — CBC
HCT: 33.9 % — ABNORMAL LOW (ref 36.0–46.0)
Hemoglobin: 11.2 g/dL — ABNORMAL LOW (ref 12.0–15.0)
MCH: 31.1 pg (ref 26.0–34.0)
MCHC: 33 g/dL (ref 30.0–36.0)
MCV: 94.2 fL (ref 80.0–100.0)
Platelets: 196 10*3/uL (ref 150–400)
RBC: 3.6 MIL/uL — ABNORMAL LOW (ref 3.87–5.11)
RDW: 11.9 % (ref 11.5–15.5)
WBC: 13.5 10*3/uL — ABNORMAL HIGH (ref 4.0–10.5)
nRBC: 0 % (ref 0.0–0.2)

## 2020-05-05 MED ORDER — OXYCODONE HCL 5 MG PO TABS: 5.0000 mg | ORAL_TABLET | Freq: Four times a day (QID) | ORAL | 0 refills | Status: DC | PRN

## 2020-05-05 MED ORDER — POTASSIUM CHLORIDE CRYS ER 20 MEQ PO TBCR
40.0000 meq | EXTENDED_RELEASE_TABLET | Freq: Once | ORAL | Status: AC
  Administered 2020-05-05: 40 meq via ORAL
  Filled 2020-05-05: qty 2

## 2020-05-05 MED ORDER — GABAPENTIN 300 MG PO CAPS: ORAL_CAPSULE | ORAL | 0 refills | Status: DC

## 2020-05-05 MED ORDER — TRAMADOL HCL 50 MG PO TABS: 50.0000 mg | ORAL_TABLET | Freq: Four times a day (QID) | ORAL | 0 refills | Status: DC | PRN

## 2020-05-05 MED ORDER — ASPIRIN 325 MG PO TBEC: 325.0000 mg | DELAYED_RELEASE_TABLET | Freq: Two times a day (BID) | ORAL | 0 refills | Status: AC

## 2020-05-05 MED ORDER — METHOCARBAMOL 500 MG PO TABS: 500.0000 mg | ORAL_TABLET | Freq: Four times a day (QID) | ORAL | 0 refills | Status: DC | PRN

## 2020-05-05 NOTE — Progress Notes (Signed)
Physical Therapy Treatment Patient Details Name: Erika Ramsey MRN: [id number] DOB: 18-May-1945 Today's Date: 05/05/2020    History of Present Illness Pt is a 75 year old female s/p Left TKA. PMH: R TKA, R DA THA, HTN, tremors    PT Comments    Pt ambulated in hallway again and practiced safe stair technique.  Pt reports understanding and feels ready for d/c home today.  Pt had no further questions, and her sister will assist her at home if needed.     Follow Up Recommendations  Follow surgeon's recommendation for DC plan and follow-up therapies     Equipment Recommendations  None recommended by PT    Recommendations for Other Services       Precautions / Restrictions Precautions Precautions: Fall;Knee Precaution Comments: able to perform SLR Restrictions Weight Bearing Restrictions: No    Mobility  Bed Mobility Overal bed mobility: Needs Assistance Bed Mobility: Supine to Sit     Supine to sit: Supervision;HOB elevated     General bed mobility comments: pt in recliner    Transfers Overall transfer level: Needs assistance Equipment used: Rolling walker (2 wheeled) Transfers: Sit to/from Stand Sit to Stand: Min guard         General transfer comment: verbal cues for UE and LE positioning  Ambulation/Gait Ambulation/Gait assistance: Min guard Gait Distance (Feet): 160 Feet Assistive device: Rolling walker (2 wheeled) Gait Pattern/deviations: Step-through pattern;Decreased stance time - left;Antalgic     General Gait Details: verbal cues for RW positioning, step length, heel strike   Stairs Stairs: Yes Stairs assistance: Min guard Stair Management: Step to pattern;One rail Left;Forwards Number of Stairs: 4 General stair comments: pt able to recall safe technique, performed twice   Wheelchair Mobility    Modified Rankin (Stroke Patients Only)       Balance                                            Cognition  Arousal/Alertness: Awake/alert Behavior During Therapy: WFL for tasks assessed/performed Overall Cognitive Status: Within Functional Limits for tasks assessed                                        Exercises    General Comments        Pertinent Vitals/Pain Pain Assessment: 0-10 Pain Score: 4  Pain Location: posterior left knee Pain Descriptors / Indicators: Sore Pain Intervention(s): Repositioned;Monitored during session;Ice applied    Home Living Family/patient expects to be discharged to:: Private residence Living Arrangements: Alone Available Help at Discharge: Family;Available 24 hours/day (sister) Type of Home: House Home Access: Stairs to enter Entrance Stairs-Rails: Right;Left Home Layout: One level Home Equipment: Environmental consultant - 2 wheels      Prior Function Level of Independence: Independent          PT Goals (current goals can now be found in the care plan section) Acute Rehab PT Goals Patient Stated Goal: home today PT Goal Formulation: With patient Time For Goal Achievement: 05/09/20 Potential to Achieve Goals: Good Progress towards PT goals: Progressing toward goals    Frequency    7X/week      PT Plan Current plan remains appropriate    Co-evaluation  AM-PAC PT "6 Clicks" Mobility   Outcome Measure  Help needed turning from your back to your side while in a flat bed without using bedrails?: A Little Help needed moving from lying on your back to sitting on the side of a flat bed without using bedrails?: A Little Help needed moving to and from a bed to a chair (including a wheelchair)?: A Little Help needed standing up from a chair using your arms (e.g., wheelchair or bedside chair)?: A Little Help needed to walk in hospital room?: A Little Help needed climbing 3-5 steps with a railing? : A Little 6 Click Score: 18    End of Session Equipment Utilized During Treatment: Gait belt Activity Tolerance: Patient  tolerated treatment well Patient left: with call bell/phone within reach;in chair Nurse Communication: Mobility status PT Visit Diagnosis: Difficulty in walking, not elsewhere classified (R26.2)     Time: 7289-7915 PT Time Calculation (min) (ACUTE ONLY): 12 min  Charges:  $Gait Training: 8-22 mins                    Arlyce Dice, DPT Acute Rehabilitation Services Pager: [phone] Office: [phone]   [place] Hibberd,KATHrine E 05/05/2020, 1:01 PM

## 2020-05-05 NOTE — Progress Notes (Signed)
   Subjective: 1 Day Post-Op Procedure(s) (LRB): TOTAL KNEE ARTHROPLASTY (Left) Patient reports pain as mild.   Patient seen in rounds by Dr. Wynelle Link. Patient is well, and has had no acute complaints or problems. States she is ready to go home. No issues overnight. Denies chest pain, SOB, or calf pain. We will begin therapy today.   Objective: Vital signs in last 24 hours: Temp:  [95.8 F (35.4 C)-98.4 F (36.9 C)] 97.7 F (36.5 C) (03/08 0535) Pulse Rate:  [50-79] 64 (03/08 0535) Resp:  [10-21] 16 (03/08 0535) BP: (116-153)/(56-102) 135/76 (03/08 0535) SpO2:  [97 %-100 %] 100 % (03/08 0535) Weight:  [58.5 kg] 58.5 kg (03/07 1245)  Intake/Output from previous day:  Intake/Output Summary (Last 24 hours) at 05/05/2020 0741 Last data filed at 05/05/2020 0700 Gross per 24 hour  Intake 3016.25 ml  Output 2550 ml  Net 466.25 ml     Intake/Output this shift: No intake/output data recorded.  Labs: Recent Labs    05/05/20 0328  HGB 11.2*   Recent Labs    05/05/20 0328  WBC 13.5*  RBC 3.60*  HCT 33.9*  PLT 196   Recent Labs    05/05/20 0328  NA 137  K 3.4*  CL 105  CO2 23  BUN 18  CREATININE 0.92  GLUCOSE 181*  CALCIUM 8.2*   No results for input(s): LABPT, INR in the last 72 hours.  Exam: General - Patient is Alert and Oriented Extremity - Neurologically intact Neurovascular intact Intact pulses distally Dorsiflexion/Plantar flexion intact Dressing - dressing C/D/I Motor Function - intact, moving foot and toes well on exam.   Past Medical History:  Diagnosis Date  . Arthritis    hips back, knees.hand  . Atrophic vaginitis   . Breast cyst   . 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 Decreased libido   . Hypertension   . Neuromuscular disorder (HCC)    tremors  . Osteopenia 08/2017   T score -1.6 FRAX 11% / 2% stable from prior DEXA  . Pneumonia    HX OF   . Shingles     Assessment/Plan: 1 Day Post-Op Procedure(s) (LRB): TOTAL KNEE ARTHROPLASTY  (Left) Active Problems:   Primary osteoarthritis of left knee  Estimated body mass index is 23.59 kg/m as calculated from the following:   Height as of this encounter: 5\' 2"  (1.575 m).   Weight as of this encounter: 58.5 kg. Advance diet Up with therapy D/C IV fluids   Patient's anticipated LOS is less than 2 midnights, meeting these requirements: - Younger than 12 - Lives within 1 hour of care - Has a competent adult at home to recover with post-op recover - NO history of  - Chronic pain requiring opiods  - Diabetes  - Coronary Artery Disease  - Heart failure  - Heart attack  - Stroke  - DVT/VTE  - Cardiac arrhythmia  - Respiratory Failure/COPD  - Renal failure  - Anemia  - Advanced Liver disease  DVT Prophylaxis - Aspirin Weight bearing as tolerated. Begin therapy.  Potassium 3.4 this AM, one dose of 40 mEq KCl ordered.  Plan is to go Home after hospital stay. Plan for discharge after one session of PT if meeting goals. Scheduled for OPP[facility] at [facility]. Follow-up in the office in 2 weeks  The PDMP database was reviewed today prior to any opioid medications being prescribed to this patient.  Theresa Duty, PA-C Orthopedic Surgery [phone] 05/05/2020, 7:41 AM

## 2020-05-05 NOTE — Evaluation (Signed)
Physical Therapy Evaluation Patient Details Name: Erika Ramsey MRN: [id number] DOB: [birth year] Today's Date: 05/05/2020   History of Present Illness  Pt is a 75 year old female s/p Left TKA. PMH: R TKA, R DA THA, HTN, tremors  Clinical Impression  Pt is s/p TKA resulting in the deficits listed below (see PT Problem List).  Pt will benefit from skilled PT to increase their independence and safety with mobility to allow discharge to the venue listed below.   Pt ambulated in hallway, practiced stairs, and performed LE exercises.  Pt provided with HEP handout and plans to f/u with OPPT on Thursday.  Pt requested second visit prior to d/c home today.      Follow Up Recommendations Follow surgeon's recommendation for DC plan and follow-up therapies    Equipment Recommendations  None recommended by PT    Recommendations for Other Services       Precautions / Restrictions Precautions Precautions: Fall;Knee Precaution Comments: able to perform SLR Restrictions Weight Bearing Restrictions: No      Mobility  Bed Mobility Overal bed mobility: Needs Assistance Bed Mobility: Supine to Sit     Supine to sit: Supervision;HOB elevated          Transfers Overall transfer level: Needs assistance Equipment used: Rolling walker (2 wheeled) Transfers: Sit to/from Stand Sit to Stand: Min guard         General transfer comment: verbal cues for UE and LE positioning  Ambulation/Gait Ambulation/Gait assistance: Min guard Gait Distance (Feet): 160 Feet Assistive device: Rolling walker (2 wheeled) Gait Pattern/deviations: Step-to pattern;Step-through pattern;Decreased stance time - left;Antalgic     General Gait Details: verbal cues for sequence, RW positioning, step length, heel strike  Stairs Stairs: Yes Stairs assistance: Min guard Stair Management: Step to pattern;One rail Left;Forwards Number of Stairs: 4 General stair comments: pt performed forwards with 2 rails and  then again only holding left rail with both hands  Wheelchair Mobility    Modified Rankin (Stroke Patients Only)       Balance                                             Pertinent Vitals/Pain Pain Assessment: 0-10 Pain Score: 4  Pain Location: posterior left knee Pain Descriptors / Indicators: Sore Pain Intervention(s): Monitored during session;Repositioned;Ice applied    Home Living Family/patient expects to be discharged to:: Private residence Living Arrangements: Alone Available Help at Discharge: Family;Available 24 hours/day (sister) Type of Home: House Home Access: Stairs to enter Entrance Stairs-Rails: Psychiatric nurse of Steps: 3 to walk way and 3 Home Layout: One level Home Equipment: Walker - 2 wheels      Prior Function Level of Independence: Independent               Hand Dominance        Extremity/Trunk Assessment        Lower Extremity Assessment Lower Extremity Assessment: LLE deficits/detail LLE Deficits / Details: able to perform SLR and ankle pumps, left knee AAROM -4-95*       Communication   Communication: No difficulties  Cognition Arousal/Alertness: Awake/alert Behavior During Therapy: WFL for tasks assessed/performed Overall Cognitive Status: Within Functional Limits for tasks assessed  General Comments      Exercises Total Joint Exercises Ankle Circles/Pumps: AROM;Both;10 reps Quad Sets: AROM;Both;10 reps Heel Slides: AAROM;Left;10 reps Hip ABduction/ADduction: AROM;Left;10 reps Straight Leg Raises: AROM;Left;10 reps Knee Flexion: AAROM;Left;10 reps;Seated   Assessment/Plan    PT Assessment Patient needs continued PT services  PT Problem List Decreased strength;Decreased range of motion;Decreased knowledge of use of DME;Pain;Decreased knowledge of precautions;Decreased mobility       PT Treatment Interventions Stair  training;Gait training;DME instruction;Therapeutic exercise;Balance training;Functional mobility training;Therapeutic activities;Patient/family education    PT Goals (Current goals can be found in the Care Plan section)  Acute Rehab PT Goals Patient Stated Goal: home today PT Goal Formulation: With patient Time For Goal Achievement: 05/09/20 Potential to Achieve Goals: Good    Frequency 7X/week   Barriers to discharge        Co-evaluation               AM-PAC PT "6 Clicks" Mobility  Outcome Measure Help needed turning from your back to your side while in a flat bed without using bedrails?: A Little Help needed moving from lying on your back to sitting on the side of a flat bed without using bedrails?: A Little Help needed moving to and from a bed to a chair (including a wheelchair)?: A Little Help needed standing up from a chair using your arms (e.g., wheelchair or bedside chair)?: A Little Help needed to walk in hospital room?: A Little Help needed climbing 3-5 steps with a railing? : A Little 6 Click Score: 18    End of Session Equipment Utilized During Treatment: Gait belt Activity Tolerance: Patient tolerated treatment well Patient left: with call bell/phone within reach;in chair   PT Visit Diagnosis: Difficulty in walking, not elsewhere classified (R26.2)    Time: 3532-9924 PT Time Calculation (min) (ACUTE ONLY): 25 min   Charges:   PT Evaluation $PT Eval Low Complexity: 1 Low PT Treatments $Therapeutic Exercise: 8-22 mins       Jannette Spanner PT, DPT Acute Rehabilitation Services Pager: [phone] Office: [phone]  York Ram E 05/05/2020, 12:57 PM

## 2020-05-05 NOTE — TOC Progression Note (Signed)
Transition of Care [facility]) - Progression Note    Patient Details  Name: Erika Ramsey MRN: [id number] Date of Birth: Jul 10, 1945  Transition of Care [facility]) CM/SW [place], [place] Phone Number: 05/05/2020, 3:26 PM  Clinical Narrative:    Prearranged therapy plan: OPPT AT EO Patient confirm she has a RW. Patient declines a 3 in1. She reports she was able to manage without a 3 in1 during a previous surgery.     Barriers to Discharge: No Barriers Identified  Expected Discharge Plan and Services           Expected Discharge Date: 05/05/20                                     Social Determinants of Health (SDOH) Interventions    Readmission Risk Interventions No flowsheet data found.

## 2020-05-05 NOTE — Progress Notes (Signed)
Patient stood at side of bed at 3:50 and dangled at hs.

## 2020-05-11 NOTE — Discharge Summary (Signed)
Physician Discharge Summary   Patient ID: Erika Ramsey MRN: [id number] DOB/AGE: 76/13/1947 76 y.o.  Admit date: 05/04/2020 Discharge date: 05/05/2020  Primary Diagnosis: Osteoarthritis, left knee  Admission Diagnoses:  Past Medical History:  Diagnosis Date  . Arthritis    hips back, knees.hand  . Atrophic vaginitis   . Breast cyst   . 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 Decreased libido   . Hypertension   . Neuromuscular disorder (HCC)    tremors  . Osteopenia 08/2017   T score -1.6 FRAX 11% / 2% stable from prior DEXA  . Pneumonia    HX OF   . Shingles    Discharge Diagnoses:   Active Problems:   Primary osteoarthritis of left knee  Estimated body mass index is 23.59 kg/m as calculated from the following:   Height as of this encounter: 5\' 2"  (1.575 m).   Weight as of this encounter: 58.5 kg.  Procedure:  Procedure(s) (LRB): TOTAL KNEE ARTHROPLASTY (Left)   Consults: None  HPI: Erika Ramsey is a 75 y.o. year old female with end stage OA of her left knee with progressively worsening pain and dysfunction. She has constant pain, with activity and at rest and significant functional deficits with difficulties even with ADLs. She has had extensive non-op management including analgesics, injections of cortisone and viscosupplements, and home exercise program, but remains in significant pain with significant dysfunction. Radiographs show bone on bone arthritis medial and patellofemoral. She presents now for left total knee arthroplasty.    Laboratory Data: Admission on 05/04/2020, Discharged on 05/05/2020  Component Date Value Ref Range Status  . WBC 05/05/2020 13.5* 4.0 - 10.5 K/uL Final  . RBC 05/05/2020 3.60* 3.87 - 5.11 MIL/uL Final  . Hemoglobin 05/05/2020 11.2* 12.0 - 15.0 g/dL Final  . HCT 05/05/2020 33.9* 36.0 - 46.0 % Final  . MCV 05/05/2020 94.2  80.0 - 100.0 fL Final  . MCH 05/05/2020 31.1  26.0 - 34.0 pg Final  . MCHC 05/05/2020 33.0  30.0 - 36.0  g/dL Final  . RDW 05/05/2020 11.9  11.5 - 15.5 % Final  . Platelets 05/05/2020 196  150 - 400 K/uL Final  . nRBC 05/05/2020 0.0  0.0 - 0.2 % Final   Performed at [facility], [place] [address], [place], [place] [address]  . Sodium 05/05/2020 137  135 - 145 mmol/L Final  . Potassium 05/05/2020 3.4* 3.5 - 5.1 mmol/L Final  . Chloride 05/05/2020 105  98 - 111 mmol/L Final  . CO2 05/05/2020 23  22 - 32 mmol/L Final  . Glucose, Bld 05/05/2020 181* 70 - 99 mg/dL Final   Glucose reference range applies only to samples taken after fasting for at least 8 hours.  . BUN 05/05/2020 18  8 - 23 mg/dL Final  . Creatinine, Ser 05/05/2020 0.92  0.44 - 1.00 mg/dL Final  . Calcium 05/05/2020 8.2* 8.9 - 10.3 mg/dL Final  . GFR, Estimated 05/05/2020 >60  >60 mL/min Final   Comment: (NOTE) Calculated using the CKD-EPI Creatinine Equation (2021)   . Anion gap 05/05/2020 9  5 - 15 Final   Performed at [facility], [place] [address] [place], [place] [address]  Hospital Outpatient Visit on 05/01/2020  Component Date Value Ref Range Status  . SARS Coronavirus 2 05/01/2020 NEGATIVE  NEGATIVE Final   Comment: (NOTE) SARS-CoV-2 target nucleic acids are NOT DETECTED.  The SARS-CoV-2 RNA is generally detectable in upper and lower respiratory  specimens during the acute phase of infection. Negative results do not preclude SARS-CoV-2 infection, do not rule out co-infections with other pathogens, and should not be used as the sole basis for treatment or other patient management decisions. Negative results must be combined with clinical observations, patient history, and epidemiological information. The expected result is Negative.  Fact Sheet for Patients: SugarRoll.be  Fact Sheet for Healthcare Providers: https://www.woods-mathews.com/  This test is not yet approved or cleared by the [place] FDA and  has been authorized for  detection and/or diagnosis of SARS-CoV-2 by FDA under an Emergency Use Authorization (EUA). This EUA will remain  in effect (meaning this test can be used) for the duration of the COVID-19 declaration under Se                          ction 564(b)(1) of the Act, 21 U.S.C. section 360bbb-3(b)(1), unless the authorization is terminated or revoked sooner.  Performed at [facility], [place] [address] [place], [place] [address]   Hospital Outpatient Visit on 04/28/2020  Component Date Value Ref Range Status  . WBC 04/28/2020 7.8  4.0 - 10.5 K/uL Final  . RBC 04/28/2020 4.79  3.87 - 5.11 MIL/uL Final  . Hemoglobin 04/28/2020 14.9  12.0 - 15.0 g/dL Final  . HCT 04/28/2020 45.2  36.0 - 46.0 % Final  . MCV 04/28/2020 94.4  80.0 - 100.0 fL Final  . MCH 04/28/2020 31.1  26.0 - 34.0 pg Final  . MCHC 04/28/2020 33.0  30.0 - 36.0 g/dL Final  . RDW 04/28/2020 11.9  11.5 - 15.5 % Final  . Platelets 04/28/2020 245  150 - 400 K/uL Final  . nRBC 04/28/2020 0.0  0.0 - 0.2 % Final   Performed at [facility], [place] [address] [place], [place] [address]  . Sodium 04/28/2020 139  135 - 145 mmol/L Final  . Potassium 04/28/2020 4.2  3.5 - 5.1 mmol/L Final  . Chloride 04/28/2020 101  98 - 111 mmol/L Final  . CO2 04/28/2020 24  22 - 32 mmol/L Final  . Glucose, Bld 04/28/2020 111* 70 - 99 mg/dL Final   Glucose reference range applies only to samples taken after fasting for at least 8 hours.  . BUN 04/28/2020 15  8 - 23 mg/dL Final  . Creatinine, Ser 04/28/2020 0.92  0.44 - 1.00 mg/dL Final  . Calcium 04/28/2020 9.6  8.9 - 10.3 mg/dL Final  . Total Protein 04/28/2020 7.3  6.5 - 8.1 g/dL Final  . Albumin 04/28/2020 4.0  3.5 - 5.0 g/dL Final  . AST 04/28/2020 22  15 - 41 U/L Final  . ALT 04/28/2020 16  0 - 44 U/L Final  . Alkaline Phosphatase 04/28/2020 68  38 - 126 U/L Final  . Total Bilirubin 04/28/2020 0.5  0.3 - 1.2 mg/dL Final  . GFR, Estimated 04/28/2020 >60  >60 mL/min Final    Comment: (NOTE) Calculated using the CKD-EPI Creatinine Equation (2021)   . Anion gap 04/28/2020 14  5 - 15 Final   Performed at [facility], [place] [address], [place], [place] [address]  . Prothrombin Time 04/28/2020 11.9  11.4 - 15.2 seconds Final  . INR 04/28/2020 0.9  0.8 - 1.2 Final   Comment: (NOTE) INR goal varies based on device and disease states. Performed at [facility], [place] [address], [place], [place] [address]   . aPTT 04/28/2020 33  24 -  36 seconds Final   Performed at [facility], [place] [address], [place], [place] [address]  . ABO/RH(D) 04/28/2020 A NEG   Final  . Antibody Screen 04/28/2020 NEG   Final  . Sample Expiration 04/28/2020 05/07/2020,2359   Final  . Extend sample reason 04/28/2020    Final                   Value:NO TRANSFUSIONS OR PREGNANCY IN THE PAST 3 MONTHS Performed at [place] [address] [place], [place] [address]   . MRSA, PCR 04/28/2020 NEGATIVE  NEGATIVE Final  . Staphylococcus aureus 04/28/2020 NEGATIVE  NEGATIVE Final   Comment: (NOTE) The Xpert SA Assay (FDA approved for NASAL specimens in patients 81 years of age and older), is one component of a comprehensive surveillance program. It is not intended to diagnose infection nor to guide or monitor treatment. Performed at [facility], [place] [address] [place], [place] [address]      X-Rays:US BREAST LTD UNI LEFT INC AXILLA  Result Date: 04/30/2020 CLINICAL DATA:  The patient was called back for left breast mass EXAM: DIGITAL DIAGNOSTIC UNILATERAL LEFT MAMMOGRAM WITH TOMOSYNTHESIS AND CAD; ULTRASOUND LEFT BREAST LIMITED TECHNIQUE: Left digital diagnostic mammography and breast tomosynthesis was performed. The images were evaluated with computer-aided detection.; Targeted ultrasound examination of the left breast was performed COMPARISON:  Previous exam(s). ACR Breast Density Category b: There  are scattered areas of fibroglandular density. FINDINGS: The mass in the left breast persists on today's imaging. On physical exam, no suspicious lumps are identified. Targeted ultrasound is performed, showing an indeterminate left breast mass at 10 o'clock, 4 cm from the nipple measuring 4 x 3 x 2 mm. No axillary adenopathy. IMPRESSION: Indeterminate left breast mass. RECOMMENDATION: Recommend ultrasound-guided biopsy of the indeterminate new left breast mass. I have discussed the findings and recommendations with the patient. If applicable, a reminder letter will be sent to the patient regarding the next appointment. BI-RADS CATEGORY  4: Suspicious. Electronically Signed   By: Dorise Bullion III M.D   On: 04/30/2020 10:13   MM DIAG BREAST TOMO UNI LEFT  Result Date: 04/30/2020 CLINICAL DATA:  The patient was called back for left breast mass EXAM: DIGITAL DIAGNOSTIC UNILATERAL LEFT MAMMOGRAM WITH TOMOSYNTHESIS AND CAD; ULTRASOUND LEFT BREAST LIMITED TECHNIQUE: Left digital diagnostic mammography and breast tomosynthesis was performed. The images were evaluated with computer-aided detection.; Targeted ultrasound examination of the left breast was performed COMPARISON:  Previous exam(s). ACR Breast Density Category b: There are scattered areas of fibroglandular density. FINDINGS: The mass in the left breast persists on today's imaging. On physical exam, no suspicious lumps are identified. Targeted ultrasound is performed, showing an indeterminate left breast mass at 10 o'clock, 4 cm from the nipple measuring 4 x 3 x 2 mm. No axillary adenopathy. IMPRESSION: Indeterminate left breast mass. RECOMMENDATION: Recommend ultrasound-guided biopsy of the indeterminate new left breast mass. I have discussed the findings and recommendations with the patient. If applicable, a reminder letter will be sent to the patient regarding the next appointment. BI-RADS CATEGORY  4: Suspicious. Electronically Signed   By: Dorise Bullion III M.D   On: 04/30/2020 10:13   MM 3D SCREEN BREAST BILATERAL  Result Date: 04/17/2020 CLINICAL DATA:  Screening. EXAM: DIGITAL SCREENING BILATERAL MAMMOGRAM WITH TOMOSYNTHESIS AND CAD TECHNIQUE: Bilateral screening digital craniocaudal and mediolateral oblique mammograms were obtained. Bilateral screening digital breast tomosynthesis was performed. The images were evaluated with computer-aided detection.  COMPARISON:  Previous exam(s). ACR Breast Density Category c: The breast tissue is heterogeneously dense, which may obscure small masses. FINDINGS: In the left breast, a possible mass warrants further evaluation. In the right breast, no findings suspicious for malignancy. IMPRESSION: Further evaluation is suggested for a possible mass in the left breast. RECOMMENDATION: Diagnostic mammogram and possibly ultrasound of the left breast. (Code:[id number]) The patient will be contacted regarding the findings, and additional imaging will be scheduled. BI-RADS CATEGORY  0: Incomplete. Need additional imaging evaluation and/or prior mammograms for comparison. Electronically Signed   By: Nolon Nations M.D.   On: 04/17/2020 14:02    EKG: Orders placed or performed during the hospital encounter of 01/15/20  . EKG 12-Lead  . EKG 12-Lead     Hospital Course: Sarahjane Matherly is a 75 y.o. who was admitted to [facility]. They were brought to the operating room on 05/04/2020 and underwent Procedure(s): TOTAL KNEE ARTHROPLASTY.  Patient tolerated the procedure well and was later transferred to the recovery room and then to the orthopaedic floor for postoperative care. They were given PO and IV analgesics for pain control following their surgery. They were given 24 hours of postoperative antibiotics of  Anti-infectives (From admission, onward)   Start     Dose/Rate Route Frequency Ordered Stop   05/04/20 2100  ceFAZolin (ANCEF) IVPB 2g/100 mL premix        2 g 200 mL/hr over 30 Minutes  Intravenous Every 6 hours 05/04/20 1810 05/05/20 0419   05/04/20 1230  ceFAZolin (ANCEF) IVPB 2g/100 mL premix        2 g 200 mL/hr over 30 Minutes Intravenous On call to O.R. 05/04/20 1221 05/04/20 1530     and started on DVT prophylaxis in the form of Aspirin.   PT and OT were ordered for total joint protocol. Discharge planning consulted to help with postop disposition and equipment needs.  Patient had a good night on the evening of surgery. They started to get up OOB with therapy on POD #1. Pt was seen during rounds and was ready to go home pending progress with therapy. Potassium was 3.4, one dose of 40 mEq KCl was ordered. She worked with therapy on POD #1 and was meeting her goals. Pt was discharged to home later that day in stable condition.  Diet: Regular diet Activity: WBAT Follow-up: in 2 weeks Disposition: Home with OPPT Discharged Condition: stable   Discharge Instructions    Call MD / Call 911   Complete by: As directed    If you experience chest pain or shortness of breath, CALL 911 and be transported to the hospital emergency room.  If you develope a fever above 101 F, pus (white drainage) or increased drainage or redness at the wound, or calf pain, call your surgeon's office.   Change dressing   Complete by: As directed    You may remove the bulky bandage (ACE wrap and gauze) two days after surgery. You will have an adhesive waterproof bandage underneath. Leave this in place until your first follow-up appointment.   Constipation Prevention   Complete by: As directed    Drink plenty of fluids.  Prune juice may be helpful.  You may use a stool softener, such as Colace (over the counter) 100 mg twice a day.  Use MiraLax (over the counter) for constipation as needed.   Diet - low sodium heart healthy   Complete by: As directed    Do not put a  pillow under the knee. Place it under the heel.   Complete by: As directed    Driving restrictions   Complete by: As directed    No  driving for two weeks   TED hose   Complete by: As directed    Use stockings (TED hose) for three weeks on both leg(s).  You may remove them at night for sleeping.   Weight bearing as tolerated   Complete by: As directed      Allergies as of 05/05/2020      Reactions   Pollen Extract    Tomato    Cheek swells when eating a large amount       Medication List    TAKE these medications   acetaminophen 500 MG tablet Commonly known as: TYLENOL Take 500 mg by mouth every 6 (six) hours as needed for moderate pain or headache.   amLODipine 5 MG tablet Commonly known as: NORVASC Take 1 tablet (5 mg total) by mouth daily.   aspirin 325 MG EC tablet Take 1 tablet (325 mg total) by mouth 2 (two) times daily for 20 days. Then take one 81 mg aspirin once a day for three weeks. Then discontinue aspirin.   CALCIUM 600 + D PO Take 1 tablet by mouth daily.   chlorthalidone 25 MG tablet Commonly known as: HYGROTON Take 12.5 mg by mouth daily.   gabapentin 300 MG capsule Commonly known as: NEURONTIN Take a 300 mg capsule three times a day for two weeks following surgery.Then take a 300 mg capsule two times a day for two weeks. Then take a 300 mg capsule once a day for two weeks. Then discontinue.   guaiFENesin 600 MG 12 hr tablet Commonly known as: MUCINEX Take 600 mg by mouth 2 (two) times daily as needed (congestion).   losartan 100 MG tablet Commonly known as: COZAAR Take 1 tablet (100 mg total) by mouth daily.   magnesium gluconate 500 MG tablet Commonly known as: MAGONATE Take 500 mg by mouth daily.   methocarbamol 500 MG tablet Commonly known as: ROBAXIN Take 1 tablet (500 mg total) by mouth every 6 (six) hours as needed for muscle spasms.   multivitamin tablet Take 1 tablet by mouth daily.   oxyCODONE 5 MG immediate release tablet Commonly known as: Oxy IR/ROXICODONE Take 1-2 tablets (5-10 mg total) by mouth every 6 (six) hours as needed for severe pain.   traMADol 50  MG tablet Commonly known as: ULTRAM Take 1-2 tablets (50-100 mg total) by mouth every 6 (six) hours as needed for moderate pain. What changed: how much to take   Turmeric 500 MG Caps Take 500 mg by mouth daily.            Discharge Care Instructions  (From admission, onward)         Start     Ordered   05/05/20 0000  Weight bearing as tolerated        05/05/20 0744   05/05/20 0000  Change dressing       Comments: You may remove the bulky bandage (ACE wrap and gauze) two days after surgery. You will have an adhesive waterproof bandage underneath. Leave this in place until your first follow-up appointment.   05/05/20 0744          Follow-up Information    [name], MD In 2 weeks.   Specialty: Orthopedic Surgery Contact information: [address] [place] [address] [phone]  Signed: Theresa Duty, PA-C Orthopedic Surgery 05/11/2020, 8:03 AM

## 2020-05-20 ENCOUNTER — Ambulatory Visit
Admission: RE | Admit: 2020-05-20 | Discharge: 2020-05-20 | Disposition: A | Discharge status: Home / Self Care | Payer: Medicare PPO | Source: Ambulatory Visit | Attending: Obstetrics & Gynecology | Admitting: Obstetrics & Gynecology

## 2020-05-20 ENCOUNTER — Other Ambulatory Visit: Payer: Self-pay

## 2020-05-20 DIAGNOSIS — R928 Other abnormal and inconclusive findings on diagnostic imaging of breast: Secondary | ICD-10-CM

## 2020-05-22 ENCOUNTER — Telehealth: Payer: Self-pay | Admitting: Hematology and Oncology

## 2020-05-22 NOTE — Telephone Encounter (Signed)
Spoke to patient to confirm afternoon [facility] appointment for 3/30, packet emailed to patient

## 2020-05-25 ENCOUNTER — Encounter: Payer: Self-pay | Admitting: *Deleted

## 2020-05-25 DIAGNOSIS — Z170 Estrogen receptor positive status [ER+]: Secondary | ICD-10-CM | POA: Insufficient documentation

## 2020-05-25 DIAGNOSIS — C50212 Malignant neoplasm of upper-inner quadrant of left female breast: Secondary | ICD-10-CM | POA: Insufficient documentation

## 2020-05-26 NOTE — Progress Notes (Signed)
[place] NOTE  Patient Care Team: [name], MD as PCP - General (Family Medicine) Terrance Mass, MD (Inactive) as Consulting Physician (Gynecology) Laurence Spates, MD (Inactive) as Consulting Physician (Gastroenterology) Mauro Kaufmann, RN as Oncology Nurse Navigator Rockwell Germany, RN as Oncology Nurse Navigator [name], MD as Consulting Physician (General Surgery) Nicholas Lose, MD as Consulting Physician (Hematology and Oncology) Gery Pray, MD as Consulting Physician (Radiation Oncology)  CHIEF COMPLAINTS/PURPOSE OF CONSULTATION:  Newly diagnosed breast cancer  HISTORY OF PRESENTING ILLNESS:  Erika Ramsey 75 y.o. female is here because of recent diagnosis of invasive ductal carcinoma of the left breast. Screening mammogram on 04/14/20 showed a possible left breast mass. Diagnostic mammogram and [place] on 04/30/20 showed a 0.4cm mass at the 10 o'clock position in the left breast. Biopsy on 05/20/20 showed invasive ductal carcinoma, grade 2, HER-2 equivocal by IHC (2+), ER/PR+ 100%, Ki67 15%. She presents to the clinic today for initial evaluation and discussion of treatment options.   I reviewed her records extensively and collaborated the history with the patient.  SUMMARY OF ONCOLOGIC HISTORY: Oncology History  Malignant neoplasm of upper-inner quadrant of left breast in female, estrogen receptor positive ([place])  05/20/2020 Initial Diagnosis   Screening mammogram showed a possible left breast mass. Diagnostic mammogram and US showed a 0.4cm mass at the 10 o'clock position in the left breast. Biopsy showed IDC, grade 2, HER-2 equivocal by IHC (2+), ER/PR+ 100%, Ki67 15%.      MEDICAL HISTORY:  Past Medical History:  Diagnosis Date  . Arthritis    hips back, knees.hand  . Atrophic vaginitis   . Breast cyst   . 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 Decreased libido   . Hypertension   . Neuromuscular disorder (HCC)    tremors   . Osteopenia 08/2017   T score -1.6 FRAX 11% / 2% stable from prior DEXA  . Pneumonia    HX OF   . Shingles     SURGICAL HISTORY: Past Surgical History:  Procedure Laterality Date  . BREAST CYST ASPIRATION    . COLPOSCOPY  1977   CIN 3  . HAND SURGERY    . KNEE SURGERY  2019  . PELVIC LAPAROSCOPY     Diag Lap  . TOTAL HIP ARTHROPLASTY Right 05/04/2016   Procedure: RIGHT TOTAL HIP ARTHROPLASTY ANTERIOR APPROACH;  Surgeon: [name], MD;  Location: [facility];  Service: Orthopedics;  Laterality: Right;  . TOTAL KNEE ARTHROPLASTY Right 01/27/2020   Procedure: TOTAL KNEE ARTHROPLASTY;  Surgeon: [name], MD;  Location: [facility];  Service: Orthopedics;  Laterality: Right;  77mn  . TOTAL KNEE ARTHROPLASTY Left 05/04/2020   Procedure: TOTAL KNEE ARTHROPLASTY;  Surgeon: AGaynelle Arabian MD;  Location: [facility];  Service: Orthopedics;  Laterality: Left;  565m    SOCIAL HISTORY: Social History   Socioeconomic History  . Marital status: Divorced    Spouse name: Not on file  . Number of children: Not on file  . Years of education: Not on file  . Highest education level: Not on file  Occupational History  . Not on file  Tobacco Use  . Smoking status: Former Smoker    Quit date: 1979    Years since quitting: 43.2  . Smokeless tobacco: Never Used  Vaping Use  . Vaping Use: Never used  Substance and Sexual Activity  . Alcohol use: Yes    Alcohol/week: 7.0 standard drinks  Types: 6 Standard drinks or equivalent, 1 Cans of beer per week    Comment: 1 BEER DAILY   . Drug use: No  . Sexual activity: Not Currently    Partners: Male    Birth control/protection: Post-menopausal    Comment: intercourse age 89, sexual partners less than 5  Other Topics Concern  . Not on file  Social History Narrative  . Not on file   Social Determinants of Health   Financial Resource Strain: Not on file  Food Insecurity: Not on file  Transportation Needs: Not on file  Physical Activity: Not  on file  Stress: Not on file  Social Connections: Not on file  Intimate Partner Violence: Not on file    FAMILY HISTORY: Family History  Problem Relation Age of Onset  . Hypertension Mother   . Heart disease Mother   . Skin cancer Mother   . Cancer Father        Colon cancer  . Heart disease Father   . Allergic rhinitis Father   . Diabetes Maternal Grandmother   . Cancer Paternal Grandfather        Colon cancer  . Breast cancer Maternal Aunt        Great aunt 52's  . Allergic rhinitis Sister   . Stomach cancer Maternal Grandfather   . Angioedema Neg Hx   . Asthma Neg Hx   . Atopy Neg Hx   . Eczema Neg Hx   . Immunodeficiency Neg Hx   . Urticaria Neg Hx     ALLERGIES:  is allergic to pollen extract and tomato.  MEDICATIONS:  Current Outpatient Medications  Medication Sig Dispense Refill  . acetaminophen (TYLENOL) 500 MG tablet Take 500 mg by mouth every 6 (six) hours as needed for moderate pain or headache.    Marland Kitchen amLODipine (NORVASC) 5 MG tablet Take 1 tablet (5 mg total) by mouth daily. 30 tablet 0  . aspirin EC 81 MG tablet Take 81 mg by mouth daily. Swallow whole.    . Calcium Carb-Cholecalciferol (CALCIUM 600 + D PO) Take 2 tablets by mouth daily.    . chlorthalidone (HYGROTON) 25 MG tablet Take 12.5 mg by mouth daily.    Marland Kitchen gabapentin (NEURONTIN) 300 MG capsule Take a 300 mg capsule three times a day for two weeks following surgery.Then take a 300 mg capsule two times a day for two weeks. Then take a 300 mg capsule once a day for two weeks. Then discontinue. 84 capsule 0  . guaiFENesin (MUCINEX) 600 MG 12 hr tablet Take 600 mg by mouth 2 (two) times daily as needed (congestion).    Marland Kitchen losartan (COZAAR) 100 MG tablet Take 100 mg by mouth daily.    . magnesium gluconate (MAGONATE) 500 MG tablet Take 500 mg by mouth daily.    . methocarbamol (ROBAXIN) 500 MG tablet Take 1 tablet (500 mg total) by mouth every 6 (six) hours as needed for muscle spasms. 40 tablet 0  .  Multiple Vitamin (MULTIVITAMIN) tablet Take 1 tablet by mouth daily.    . traMADol (ULTRAM) 50 MG tablet Take 1-2 tablets (50-100 mg total) by mouth every 6 (six) hours as needed for moderate pain. 40 tablet 0  . Turmeric 500 MG CAPS Take 500 mg by mouth daily.     No current facility-administered medications for this visit.    REVIEW OF SYSTEMS:    All other systems were reviewed with the patient and are negative.  PHYSICAL EXAMINATION: ECOG PERFORMANCE  STATUS: 1 - Symptomatic but completely ambulatory  Vitals:   05/27/20 1333  BP: (!) 149/66  Pulse: 67  Resp: 20  Temp: 97.9 F (36.6 C)  SpO2: 99%   Filed Weights   05/27/20 1333  Weight: 127 lb 9.6 oz (57.9 kg)       LABORATORY DATA:  I have reviewed the data as listed Lab Results  Component Value Date   WBC 6.4 05/27/2020   HGB 12.4 05/27/2020   HCT 38.2 05/27/2020   MCV 95.5 05/27/2020   PLT 367 05/27/2020   Lab Results  Component Value Date   NA 139 05/27/2020   K 3.7 05/27/2020   CL 100 05/27/2020   CO2 26 05/27/2020    RADIOGRAPHIC STUDIES: I have personally reviewed the radiological reports and agreed with the findings in the report.  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 Diagnostic mammogram and US showed a 0.4cm mass at the 10 o'clock position in the left breast. Biopsy showed IDC, grade 2, HER-2 equivocal by IHC (2+), ER/PR+ 100%, Ki67 15%.   Pathology and radiology counseling: Discussed with the patient, the details of pathology including the type of breast cancer,the clinical staging, the significance of ER, PR and HER-2/neu receptors and the implications for treatment. After reviewing the pathology in detail, we proceeded to discuss the different treatment options between surgery, radiation, chemotherapy, antiestrogen therapies.  Treatment  plan: 1.  Breast conserving surgery 2. +/-XRT 3.  Adjuvant antiestrogen therapy  Anastrozole counseling: We discussed the risks and benefits of anti-estrogen therapy with aromatase inhibitors. These include but not limited to insomnia, hot flashes, mood changes, vaginal dryness, bone density loss, and weight gain. We strongly believe that the benefits far outweigh the risks. Patient understands these risks and consented to starting treatment. Planned treatment duration is 5 years.  Return to clinic after surgery to discuss final pathology results  All questions were answered. The patient knows to call the clinic with any problems, questions or concerns.   Rulon Eisenmenger, MD, MPH 05/27/2020    I, Molly Dorshimer, am acting as scribe for Nicholas Lose, MD.  I have reviewed the above documentation for accuracy and completeness, and I agree with the above.

## 2020-05-27 ENCOUNTER — Ambulatory Visit
Admission: RE | Admit: 2020-05-27 | Discharge: 2020-05-27 | Disposition: A | Discharge status: Home / Self Care | Payer: Medicare PPO | Source: Ambulatory Visit | Attending: Radiation Oncology | Admitting: Radiation Oncology

## 2020-05-27 ENCOUNTER — Ambulatory Visit: Payer: Medicare PPO | Admitting: Physical Therapy

## 2020-05-27 ENCOUNTER — Encounter: Payer: Self-pay | Admitting: *Deleted

## 2020-05-27 ENCOUNTER — Inpatient Hospital Stay (HOSPITAL_BASED_OUTPATIENT_CLINIC_OR_DEPARTMENT_OTHER): Payer: Medicare PPO | Admitting: Hematology and Oncology

## 2020-05-27 ENCOUNTER — Inpatient Hospital Stay: Payer: Medicare PPO | Attending: Hematology and Oncology

## 2020-05-27 ENCOUNTER — Ambulatory Visit: Payer: Self-pay | Admitting: General Surgery

## 2020-05-27 ENCOUNTER — Other Ambulatory Visit: Payer: Self-pay

## 2020-05-27 DIAGNOSIS — Z8249 Family history of ischemic heart disease and other diseases of the circulatory system: Secondary | ICD-10-CM

## 2020-05-27 DIAGNOSIS — M8580 Other specified disorders of bone density and structure, unspecified site: Secondary | ICD-10-CM | POA: Diagnosis not present

## 2020-05-27 DIAGNOSIS — Z7982 Long term (current) use of aspirin: Secondary | ICD-10-CM

## 2020-05-27 DIAGNOSIS — Z800 Family history of malignant neoplasm of digestive organs: Secondary | ICD-10-CM

## 2020-05-27 DIAGNOSIS — Z170 Estrogen receptor positive status [ER+]: Secondary | ICD-10-CM | POA: Insufficient documentation

## 2020-05-27 DIAGNOSIS — C50212 Malignant neoplasm of upper-inner quadrant of left female breast: Secondary | ICD-10-CM

## 2020-05-27 DIAGNOSIS — Z803 Family history of malignant neoplasm of breast: Secondary | ICD-10-CM | POA: Diagnosis not present

## 2020-05-27 DIAGNOSIS — Z79899 Other long term (current) drug therapy: Secondary | ICD-10-CM | POA: Insufficient documentation

## 2020-05-27 DIAGNOSIS — Z833 Family history of diabetes mellitus: Secondary | ICD-10-CM | POA: Insufficient documentation

## 2020-05-27 DIAGNOSIS — Z87891 Personal history of nicotine dependence: Secondary | ICD-10-CM | POA: Diagnosis not present

## 2020-05-27 DIAGNOSIS — I10 Essential (primary) hypertension: Secondary | ICD-10-CM | POA: Diagnosis not present

## 2020-05-27 LAB — CBC WITH DIFFERENTIAL (CANCER CENTER ONLY)
Abs Immature Granulocytes: 0.01 10*3/uL (ref 0.00–0.07)
Basophils Absolute: 0.1 10*3/uL (ref 0.0–0.1)
Basophils Relative: 1 %
Eosinophils Absolute: 0.3 10*3/uL (ref 0.0–0.5)
Eosinophils Relative: 5 %
HCT: 38.2 % (ref 36.0–46.0)
Hemoglobin: 12.4 g/dL (ref 12.0–15.0)
Immature Granulocytes: 0 %
Lymphocytes Relative: 34 %
Lymphs Abs: 2.2 10*3/uL (ref 0.7–4.0)
MCH: 31 pg (ref 26.0–34.0)
MCHC: 32.5 g/dL (ref 30.0–36.0)
MCV: 95.5 fL (ref 80.0–100.0)
Monocytes Absolute: 0.7 10*3/uL (ref 0.1–1.0)
Monocytes Relative: 10 %
Neutro Abs: 3.1 10*3/uL (ref 1.7–7.7)
Neutrophils Relative %: 50 %
Platelet Count: 367 10*3/uL (ref 150–400)
RBC: 4 MIL/uL (ref 3.87–5.11)
RDW: 13.9 % (ref 11.5–15.5)
WBC Count: 6.4 10*3/uL (ref 4.0–10.5)
nRBC: 0 % (ref 0.0–0.2)

## 2020-05-27 LAB — CMP (CANCER CENTER ONLY)
ALT: 16 U/L (ref 0–44)
AST: 18 U/L (ref 15–41)
Albumin: 3.7 g/dL (ref 3.5–5.0)
Alkaline Phosphatase: 99 U/L (ref 38–126)
Anion gap: 13 (ref 5–15)
BUN: 18 mg/dL (ref 8–23)
CO2: 26 mmol/L (ref 22–32)
Calcium: 9.1 mg/dL (ref 8.9–10.3)
Chloride: 100 mmol/L (ref 98–111)
Creatinine: 0.83 mg/dL (ref 0.44–1.00)
GFR, Estimated: >60 mL/min (ref >60)
Glucose, Bld: 96 mg/dL (ref 70–99)
Potassium: 3.7 mmol/L (ref 3.5–5.1)
Sodium: 139 mmol/L (ref 135–145)
Total Bilirubin: 0.4 mg/dL (ref 0.3–1.2)
Total Protein: 7.2 g/dL (ref 6.5–8.1)

## 2020-05-27 LAB — GENETIC SCREENING ORDER

## 2020-05-27 NOTE — Progress Notes (Signed)
Radiation Oncology         [phone] ________________________________  Multidisciplinary Breast Oncology Clinic [facility]) Initial Outpatient Consultation  Name: Erika Ramsey MRN: [id number]  Date: 05/27/2020  DOB: [birth year]  XQ:JJHERDE, Erika Munroe, MD  [name], MD   REFERRING PHYSICIAN: [name], MD  DIAGNOSIS: The encounter diagnosis was Malignant neoplasm of upper-inner quadrant of left breast in female, estrogen receptor positive ([place]).  Stage I-A (cT1a, cN0) Left Breast UIQ, Invasive Ductal Carcinoma, ER+ / PR+ / Her2-, Grade 2    ICD-10-CM   1. Malignant neoplasm of upper-inner quadrant of left breast in female, estrogen receptor positive ([place])  C50.212    Z17.0     HISTORY OF PRESENT ILLNESS::[name] is a 75 y.o. female who is presenting to the office today for evaluation of her newly diagnosed breast cancer. She is doing well overall.   She had routine screening mammography on 04/14/2020 that showed a possible mass in the left breast. She underwent unilateral diagnostic mammography with tomography and left breast ultrasonography at [facility] on 04/30/2020 that showed an indeterminate left breast mass in the upper inner quadrant.  This measured approximately 4 mm.  Biopsy on 05/20/2020 showed: grade 2 invasive ductal carcinoma. Prognostic indicators significant for: estrogen receptor, 100% positive and progesterone receptor, 100% positive, both with strong staining intensities. Proliferation marker Ki67 at 15%. HER2 negative.    The patient was referred today for presentation in the multidisciplinary conference.  Radiology studies and pathology slides were presented there for review and discussion of treatment options.  A consensus was discussed regarding potential next steps.  PREVIOUS RADIATION THERAPY: No  PAST MEDICAL HISTORY:  Past Medical History:  Diagnosis Date  . Arthritis    hips back, knees.hand  . Atrophic vaginitis    . Breast cyst   . 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 Decreased libido   . Hypertension   . Neuromuscular disorder (HCC)    tremors  . Osteopenia 08/2017   T score -1.6 FRAX 11% / 2% stable from prior DEXA  . Pneumonia    HX OF   . Shingles     PAST SURGICAL HISTORY: Past Surgical History:  Procedure Laterality Date  . BREAST CYST ASPIRATION    . COLPOSCOPY  1977   CIN 3  . HAND SURGERY    . KNEE SURGERY  2019  . PELVIC LAPAROSCOPY     Diag Lap  . TOTAL HIP ARTHROPLASTY Right 05/04/2016   Procedure: RIGHT TOTAL HIP ARTHROPLASTY ANTERIOR APPROACH;  Surgeon: [name], MD;  Location: [facility];  Service: Orthopedics;  Laterality: Right;  . TOTAL KNEE ARTHROPLASTY Right 01/27/2020   Procedure: TOTAL KNEE ARTHROPLASTY;  Surgeon: [name], MD;  Location: [facility];  Service: Orthopedics;  Laterality: Right;  63mn  . TOTAL KNEE ARTHROPLASTY Left 05/04/2020   Procedure: TOTAL KNEE ARTHROPLASTY;  Surgeon: AGaynelle Arabian MD;  Location: [facility];  Service: Orthopedics;  Laterality: Left;  587m    FAMILY HISTORY:  Family History  Problem Relation Age of Onset  . Hypertension Mother   . Heart disease Mother   . Skin cancer Mother   . Cancer Father        Colon cancer  . Heart disease Father   . Allergic rhinitis Father   . Diabetes Maternal Grandmother   . Cancer Paternal Grandfather        Colon cancer  . Breast cancer Maternal Aunt  Great aunt 56's  . Allergic rhinitis Sister   . Stomach cancer Maternal Grandfather   . Angioedema Neg Hx   . Asthma Neg Hx   . Atopy Neg Hx   . Eczema Neg Hx   . Immunodeficiency Neg Hx   . Urticaria Neg Hx     SOCIAL HISTORY:  Social History   Socioeconomic History  . Marital status: Divorced    Spouse name: Not on file  . Number of children: Not on file  . Years of education: Not on file  . Highest education level: Not on file  Occupational History  . Not on file  Tobacco Use  . Smoking status: Former Smoker    Quit  date: 1979    Years since quitting: 43.2  . Smokeless tobacco: Never Used  Vaping Use  . Vaping Use: Never used  Substance and Sexual Activity  . Alcohol use: Yes    Alcohol/week: 7.0 standard drinks    Types: 6 Standard drinks or equivalent, 1 Cans of beer per week    Comment: 1 BEER DAILY   . Drug use: No  . Sexual activity: Not Currently    Partners: Male    Birth control/protection: Post-menopausal    Comment: intercourse age 64, sexual partners less than 5  Other Topics Concern  . Not on file  Social History Narrative  . Not on file   Social Determinants of Health   Financial Resource Strain: Not on file  Food Insecurity: Not on file  Transportation Needs: Not on file  Physical Activity: Not on file  Stress: Not on file  Social Connections: Not on file    ALLERGIES:  Allergies  Allergen Reactions  . Pollen Extract   . Tomato     Cheek swells when eating a large amount     MEDICATIONS:  Current Outpatient Medications  Medication Sig Dispense Refill  . acetaminophen (TYLENOL) 500 MG tablet Take 500 mg by mouth every 6 (six) hours as needed for moderate pain or headache.    Marland Kitchen amLODipine (NORVASC) 5 MG tablet Take 1 tablet (5 mg total) by mouth daily. 30 tablet 0  . aspirin EC 81 MG tablet Take 81 mg by mouth daily. Swallow whole.    . Calcium Carb-Cholecalciferol (CALCIUM 600 + D PO) Take 2 tablets by mouth daily.    . chlorthalidone (HYGROTON) 25 MG tablet Take 12.5 mg by mouth daily.    Marland Kitchen gabapentin (NEURONTIN) 300 MG capsule Take a 300 mg capsule three times a day for two weeks following surgery.Then take a 300 mg capsule two times a day for two weeks. Then take a 300 mg capsule once a day for two weeks. Then discontinue. 84 capsule 0  . guaiFENesin (MUCINEX) 600 MG 12 hr tablet Take 600 mg by mouth 2 (two) times daily as needed (congestion).    Marland Kitchen losartan (COZAAR) 100 MG tablet Take 100 mg by mouth daily.    . magnesium gluconate (MAGONATE) 500 MG tablet Take  500 mg by mouth daily.    . methocarbamol (ROBAXIN) 500 MG tablet Take 1 tablet (500 mg total) by mouth every 6 (six) hours as needed for muscle spasms. 40 tablet 0  . Multiple Vitamin (MULTIVITAMIN) tablet Take 1 tablet by mouth daily.    . traMADol (ULTRAM) 50 MG tablet Take 1-2 tablets (50-100 mg total) by mouth every 6 (six) hours as needed for moderate pain. 40 tablet 0  . Turmeric 500 MG CAPS Take 500 mg by  mouth daily.     No current facility-administered medications for this encounter.    REVIEW OF SYSTEMS: A 10+ POINT REVIEW OF SYSTEMS WAS OBTAINED including neurology, dermatology, psychiatry, cardiac, respiratory, lymph, extremities, GI, GU, musculoskeletal, constitutional, reproductive, HEENT. On the provided form, she reports no history of breast pain nipple discharge or bleeding. She denies swelling in her left arm or hand and any other symptoms.    PHYSICAL EXAM:     Vitals:   05/27/20 1333  BP: (!) 149/66  Pulse: 67  Resp: 20  Temp: 97.9 F (36.6 C)  SpO2: 99%    Lungs are clear to auscultation bilaterally. Heart has regular rate and rhythm. No palpable cervical, supraclavicular, or axillary adenopathy. Abdomen soft, non-tender, normal bowel sounds. Right breast with no palpable mass, nipple discharge, or bleeding.  Left breast with mild bruising in the upper inner aspect of the left breast.  No palpable mass nipple discharge or bleeding.   KPS = 100  100 - Normal; no complaints; no evidence of disease. 90   - Able to carry on normal activity; minor signs or symptoms of disease. 80   - Normal activity with effort; some signs or symptoms of disease. 28   - Cares for self; unable to carry on normal activity or to do active work. 60   - Requires occasional assistance, but is able to care for most of his personal needs. 50   - Requires considerable assistance and frequent medical care. 75   - Disabled; requires special care and assistance. 19   - Severely disabled;  hospital admission is indicated although death not imminent. 38   - Very sick; hospital admission necessary; active supportive treatment necessary. 10   - Moribund; fatal processes progressing rapidly. 0     - Dead  Karnofsky DA, Abelmann Taft, Craver LS and Burchenal JH [phone]) The use of the nitrogen mustards in the palliative treatment of carcinoma: with particular reference to bronchogenic carcinoma Cancer 1 634-56  LABORATORY DATA:  Lab Results  Component Value Date   WBC 6.4 05/27/2020   HGB 12.4 05/27/2020   HCT 38.2 05/27/2020   MCV 95.5 05/27/2020   PLT 367 05/27/2020   Lab Results  Component Value Date   NA 139 05/27/2020   K 3.7 05/27/2020   CL 100 05/27/2020   CO2 26 05/27/2020   Lab Results  Component Value Date   ALT 16 05/27/2020   AST 18 05/27/2020   ALKPHOS 99 05/27/2020   BILITOT 0.4 05/27/2020    PULMONARY FUNCTION TEST:   Recent Review Flowsheet Data   There is no flowsheet data to display.     RADIOGRAPHY: MM CLIP PLACEMENT LEFT  Result Date: 05/20/2020 CLINICAL DATA:  Evaluate RIBBON biopsy marker clip following ultrasound-guided LEFT breast biopsy. EXAM: 3D DIAGNOSTIC LEFT MAMMOGRAM POST ULTRASOUND BIOPSY COMPARISON:  Previous exam(s). FINDINGS: 3D Mammographic images were obtained following ultrasound guided biopsy of the 0.4 cm mass at the 10 o'clock position of the LEFT breast. The RIBBON biopsy marking clip is in expected position at the site of biopsy. IMPRESSION: Appropriate positioning of the RIBBON shaped biopsy marking clip at the site of biopsy in the UPPER INNER LEFT breast. Final Assessment: Post Procedure Mammograms for Marker Placement Electronically Signed   By: Margarette Canada M.D.   On: 05/20/2020 13:53   [place] LT BREAST BX W LOC DEV 1ST LESION IMG BX SPEC US GUIDE  Addendum Date: 05/21/2020   ADDENDUM REPORT: 05/21/2020 12:39 ADDENDUM: Pathology  revealed GRADE II INVASIVE DUCTAL CARCINOMA of the LEFT breast, 10 o'clock, upper inner. This was  found to be concordant by Dr. Hassan Rowan. Pathology results were discussed with the patient by telephone. The patient reported doing well after the biopsy with tenderness at the site. Post biopsy instructions and care were reviewed and questions were answered. The patient was encouraged to call [facility] for any additional concerns. My direct phone number was provided. The patient was referred to [facility] on May 27, 2020. Pathology results reported by Terie Purser, RN on 05/21/2020. Electronically Signed   By: Margarette Canada M.D.   On: 05/21/2020 12:39   Result Date: 05/21/2020 CLINICAL DATA:  75 year old female for tissue sampling of UPPER INNER LEFT breast mass. EXAM: ULTRASOUND GUIDED LEFT BREAST CORE NEEDLE BIOPSY COMPARISON:  Previous exam(s). PROCEDURE: I met with the patient and we discussed the procedure of ultrasound-guided biopsy, including benefits and alternatives. We discussed the high likelihood of a successful procedure. We discussed the risks of the procedure, including infection, bleeding, tissue injury, clip migration, and inadequate sampling. Informed written consent was given. The usual time-out protocol was performed immediately prior to the procedure. Lesion quadrant: UPPER INNER LEFT breast Using sterile technique and 1% Lidocaine with and without epinephrine as local anesthetic, under direct ultrasound visualization, a 12 gauge spring-loaded device was used to perform biopsy of the 0.4 cm mass at the 10 o'clock position of the LEFT breast 4 cm from the nipple using a MEDIAL approach. At the conclusion of the procedure a RIBBON tissue marker clip was deployed into the biopsy cavity. Follow up 2 view mammogram was performed and dictated separately. IMPRESSION: Ultrasound guided biopsy of 0.4 cm UPPER INNER LEFT breast mass. No apparent complications. Electronically Signed: By: Margarette Canada  M.D. On: 05/20/2020 13:43      IMPRESSION: Stage I-A  Left Breast UIQ, Invasive Ductal Carcinoma, ER+ / PR+ / Her2-, Grade 2  The patient will be a good candidate for breast conservation with radiotherapy to the left breast. We discussed the general course of radiation, potential side effects, and toxicities with radiation and the patient is interested in this approach.   Depending on results of surgery the patient may be able to safely avoid adjuvant radiation therapy if she proceeds with adjuvant hormonal therapy.    PLAN:  1.  Breast conserving surgery 2. +/-XRT 3.  Adjuvant antiestrogen therapy   ------------------------------------------------  Blair Promise, PhD, MD  This document serves as a record of services personally performed by Gery Pray, MD. It was created on his behalf by Clerance Lav, a trained medical scribe. The creation of this record is based on the scribe's personal observations and the provider's statements to them. This document has been checked and approved by the attending provider.

## 2020-05-27 NOTE — Progress Notes (Signed)
[facility] Psychosocial Distress Screening Counseling Intern  Counseling intern was referred by distress screening protocol.  The patient scored a 5 on the Psychosocial Distress Thermometer which indicates moderate distress. Counseling intern met with patient in exam room" to assess for distress and other psychosocial needs. Patient attended clinic alone and reported good friend support and family support, despite family being physically distant. The patient had experienced a lot of uncertainty but she was getting the news she wanted, which caused her distress to go down to a 3. The patient was anxious to go, and did not have many concerns to share.   ONCBCN DISTRESS SCREENING 05/27/2020  Screening Type Initial Screening  Distress experienced in past week (1-10) 5  Emotional problem type Adjusting to illness  Physical Problem type Pain;Sleep/insomnia  Referral to support programs Yes   Follow up needed: No.   Gaylyn Rong Counseling Intern

## 2020-05-27 NOTE — Assessment & Plan Note (Signed)
05/20/2020:Screening mammogram showed a possible left breast mass. Diagnostic mammogram and US showed a 0.4cm mass at the 10 o'clock position in the left breast. Biopsy showed IDC, grade 2, HER-2 equivocal by IHC (2+), ER/PR+ 100%, Ki67 15%.   Pathology and radiology counseling: Discussed with the patient, the details of pathology including the type of breast cancer,the clinical staging, the significance of ER, PR and HER-2/neu receptors and the implications for treatment. After reviewing the pathology in detail, we proceeded to discuss the different treatment options between surgery, radiation, chemotherapy, antiestrogen therapies.  Treatment plan: 1.  Breast conserving surgery 2. +/-XRT 3.  Adjuvant antiestrogen therapy  Anastrozole counseling: We discussed the risks and benefits of anti-estrogen therapy with aromatase inhibitors. These include but not limited to insomnia, hot flashes, mood changes, vaginal dryness, bone density loss, and weight gain. We strongly believe that the benefits far outweigh the risks. Patient understands these risks and consented to starting treatment. Planned treatment duration is 5 years.  Return to clinic after surgery to discuss final pathology results

## 2020-06-01 ENCOUNTER — Encounter: Payer: Self-pay | Admitting: *Deleted

## 2020-06-01 ENCOUNTER — Telehealth: Payer: Self-pay | Admitting: *Deleted

## 2020-06-01 NOTE — Telephone Encounter (Signed)
Spoke with patient to follow up from [facility] 3/30 and assess navigation needs. Patient denies any questions or concerns at this time.  Encouraged her to call should anything arise. Patient verbalized understanding.

## 2020-06-02 ENCOUNTER — Other Ambulatory Visit: Payer: Self-pay | Admitting: General Surgery

## 2020-06-02 ENCOUNTER — Encounter: Payer: Self-pay | Admitting: *Deleted

## 2020-06-02 DIAGNOSIS — C50212 Malignant neoplasm of upper-inner quadrant of left female breast: Secondary | ICD-10-CM

## 2020-06-03 ENCOUNTER — Telehealth: Payer: Self-pay | Admitting: Hematology and Oncology

## 2020-06-03 NOTE — Telephone Encounter (Signed)
Scheduled appt per 4/5 sch msg. Pt aware.

## 2020-06-08 ENCOUNTER — Encounter: Payer: Self-pay | Admitting: *Deleted

## 2020-06-18 ENCOUNTER — Encounter (HOSPITAL_BASED_OUTPATIENT_CLINIC_OR_DEPARTMENT_OTHER): Payer: Self-pay | Admitting: General Surgery

## 2020-06-18 ENCOUNTER — Other Ambulatory Visit: Payer: Self-pay

## 2020-06-22 ENCOUNTER — Other Ambulatory Visit (HOSPITAL_COMMUNITY)
Admission: RE | Admit: 2020-06-22 | Discharge: 2020-06-22 | Disposition: A | Discharge status: Home / Self Care | Payer: Medicare PPO | Source: Ambulatory Visit | Attending: General Surgery | Admitting: General Surgery

## 2020-06-22 ENCOUNTER — Encounter (HOSPITAL_BASED_OUTPATIENT_CLINIC_OR_DEPARTMENT_OTHER)
Admission: RE | Admit: 2020-06-22 | Discharge: 2020-06-22 | Disposition: A | Discharge status: Home / Self Care | Payer: Medicare PPO | Source: Ambulatory Visit | Attending: General Surgery | Admitting: General Surgery

## 2020-06-22 DIAGNOSIS — Z20822 Contact with and (suspected) exposure to covid-19: Secondary | ICD-10-CM | POA: Insufficient documentation

## 2020-06-22 DIAGNOSIS — Z01812 Encounter for preprocedural laboratory examination: Secondary | ICD-10-CM | POA: Insufficient documentation

## 2020-06-22 LAB — BASIC METABOLIC PANEL
Anion gap: 10 (ref 5–15)
BUN: 16 mg/dL (ref 8–23)
CO2: 25 mmol/L (ref 22–32)
Calcium: 9.5 mg/dL (ref 8.9–10.3)
Chloride: 100 mmol/L (ref 98–111)
Creatinine, Ser: 0.86 mg/dL (ref 0.44–1.00)
GFR, Estimated: >60 mL/min (ref >60)
Glucose, Bld: 149 mg/dL — ABNORMAL HIGH (ref 70–99)
Potassium: 3.4 mmol/L — ABNORMAL LOW (ref 3.5–5.1)
Sodium: 135 mmol/L (ref 135–145)

## 2020-06-22 NOTE — Progress Notes (Signed)
      Enhanced Recovery after Surgery for Orthopedics Enhanced Recovery after Surgery is a protocol used to improve the stress on your body and your recovery after surgery.  Patient Instructions  . The night before surgery:  o No food after midnight. ONLY clear liquids after midnight  . The day of surgery (if you do NOT have diabetes):  o Drink ONE (1) Pre-Surgery Clear Ensure as directed.   o This drink was given to you during your hospital  pre-op appointment visit. o The pre-op nurse will instruct you on the time to drink the  Pre-Surgery Ensure depending on your surgery time. o Finish the drink at the designated time by the pre-op nurse.  o Nothing else to drink after completing the  Pre-Surgery Clear Ensure.  . The day of surgery (if you have diabetes): o Drink ONE (1) Gatorade 2 (G2) as directed. o This drink was given to you during your hospital  pre-op appointment visit.  o The pre-op nurse will instruct you on the time to drink the   Gatorade 2 (G2) depending on your surgery time. o Color of the Gatorade may vary. Red is not allowed. o Nothing else to drink after completing the  Gatorade 2 (G2).         If you have questions, please contact your surgeon's office.   Surgical soap given to patient, verbalizes understanding.

## 2020-06-23 LAB — SARS CORONAVIRUS 2 (TAT 6-24 HRS): SARS Coronavirus 2: NEGATIVE

## 2020-06-24 ENCOUNTER — Ambulatory Visit
Admission: RE | Admit: 2020-06-24 | Discharge: 2020-06-24 | Disposition: A | Discharge status: Home / Self Care | Payer: Medicare PPO | Source: Ambulatory Visit | Attending: General Surgery | Admitting: General Surgery

## 2020-06-24 ENCOUNTER — Other Ambulatory Visit: Payer: Self-pay

## 2020-06-24 DIAGNOSIS — C50212 Malignant neoplasm of upper-inner quadrant of left female breast: Secondary | ICD-10-CM

## 2020-06-24 DIAGNOSIS — Z170 Estrogen receptor positive status [ER+]: Secondary | ICD-10-CM

## 2020-06-25 ENCOUNTER — Ambulatory Visit (HOSPITAL_BASED_OUTPATIENT_CLINIC_OR_DEPARTMENT_OTHER): Payer: Medicare PPO | Admitting: Anesthesiology

## 2020-06-25 ENCOUNTER — Ambulatory Visit (HOSPITAL_BASED_OUTPATIENT_CLINIC_OR_DEPARTMENT_OTHER)
Admission: RE | Admit: 2020-06-25 | Discharge: 2020-06-25 | Disposition: A | Discharge status: Home / Self Care | Payer: Medicare PPO | Attending: General Surgery | Admitting: General Surgery

## 2020-06-25 ENCOUNTER — Ambulatory Visit
Admission: RE | Admit: 2020-06-25 | Discharge: 2020-06-25 | Disposition: A | Discharge status: Home / Self Care | Payer: Medicare PPO | Source: Ambulatory Visit | Attending: General Surgery | Admitting: General Surgery

## 2020-06-25 ENCOUNTER — Encounter (HOSPITAL_BASED_OUTPATIENT_CLINIC_OR_DEPARTMENT_OTHER): Payer: Self-pay | Admitting: General Surgery

## 2020-06-25 ENCOUNTER — Encounter (HOSPITAL_BASED_OUTPATIENT_CLINIC_OR_DEPARTMENT_OTHER)
Admission: RE | Disposition: A | Discharge status: Home / Self Care | Payer: Self-pay | Source: Home / Self Care | Attending: General Surgery

## 2020-06-25 ENCOUNTER — Other Ambulatory Visit: Payer: Self-pay

## 2020-06-25 DIAGNOSIS — Z79899 Other long term (current) drug therapy: Secondary | ICD-10-CM | POA: Diagnosis not present

## 2020-06-25 DIAGNOSIS — N6489 Other specified disorders of breast: Secondary | ICD-10-CM | POA: Insufficient documentation

## 2020-06-25 DIAGNOSIS — Z87891 Personal history of nicotine dependence: Secondary | ICD-10-CM | POA: Insufficient documentation

## 2020-06-25 DIAGNOSIS — C50212 Malignant neoplasm of upper-inner quadrant of left female breast: Secondary | ICD-10-CM | POA: Insufficient documentation

## 2020-06-25 DIAGNOSIS — Z170 Estrogen receptor positive status [ER+]: Secondary | ICD-10-CM | POA: Diagnosis not present

## 2020-06-25 HISTORY — PX: BREAST LUMPECTOMY WITH RADIOACTIVE SEED LOCALIZATION: SHX6424

## 2020-06-25 SURGERY — BREAST LUMPECTOMY WITH RADIOACTIVE SEED LOCALIZATION
Anesthesia: General | Site: Breast | Laterality: Left

## 2020-06-25 MED ORDER — AMISULPRIDE (ANTIEMETIC) 5 MG/2ML IV SOLN: 10.0000 mg | Freq: Once | INTRAVENOUS | Status: DC | PRN

## 2020-06-25 MED ORDER — BUPIVACAINE-EPINEPHRINE (PF) 0.25% -1:200000 IJ SOLN
INTRAMUSCULAR | Status: DC | PRN
  Administered 2020-06-25: 20 mL

## 2020-06-25 MED ORDER — FENTANYL CITRATE (PF) 100 MCG/2ML IJ SOLN
INTRAMUSCULAR | Status: DC | PRN
  Administered 2020-06-25: 50 ug via INTRAVENOUS

## 2020-06-25 MED ORDER — CELECOXIB 200 MG PO CAPS
ORAL_CAPSULE | ORAL | Status: AC
  Filled 2020-06-25: qty 1

## 2020-06-25 MED ORDER — CELECOXIB 200 MG PO CAPS
200.0000 mg | ORAL_CAPSULE | ORAL | Status: AC
  Administered 2020-06-25: 200 mg via ORAL

## 2020-06-25 MED ORDER — OXYCODONE HCL 5 MG PO TABS: 5.0000 mg | ORAL_TABLET | Freq: Once | ORAL | Status: DC | PRN

## 2020-06-25 MED ORDER — GABAPENTIN 300 MG PO CAPS
ORAL_CAPSULE | ORAL | Status: AC
  Filled 2020-06-25: qty 1

## 2020-06-25 MED ORDER — OXYCODONE HCL 5 MG/5ML PO SOLN: 5.0000 mg | Freq: Once | ORAL | Status: DC | PRN

## 2020-06-25 MED ORDER — LIDOCAINE HCL (CARDIAC) PF 100 MG/5ML IV SOSY
PREFILLED_SYRINGE | INTRAVENOUS | Status: DC | PRN
  Administered 2020-06-25: 60 mg via INTRATRACHEAL

## 2020-06-25 MED ORDER — ACETAMINOPHEN 500 MG PO TABS
1000.0000 mg | ORAL_TABLET | ORAL | Status: AC
  Administered 2020-06-25: 1000 mg via ORAL

## 2020-06-25 MED ORDER — ONDANSETRON HCL 4 MG/2ML IJ SOLN
INTRAMUSCULAR | Status: DC | PRN
  Administered 2020-06-25: 4 mg via INTRAVENOUS

## 2020-06-25 MED ORDER — PROMETHAZINE HCL 25 MG/ML IJ SOLN
6.2500 mg | INTRAMUSCULAR | Status: DC | PRN
Start: 2020-06-25 — End: 2020-06-25

## 2020-06-25 MED ORDER — CEFAZOLIN SODIUM-DEXTROSE 2-4 GM/100ML-% IV SOLN
INTRAVENOUS | Status: AC
  Filled 2020-06-25: qty 100

## 2020-06-25 MED ORDER — LIDOCAINE 2% (20 MG/ML) 5 ML SYRINGE
INTRAMUSCULAR | Status: AC
  Filled 2020-06-25: qty 5

## 2020-06-25 MED ORDER — FENTANYL CITRATE (PF) 100 MCG/2ML IJ SOLN
INTRAMUSCULAR | Status: AC
  Filled 2020-06-25: qty 2

## 2020-06-25 MED ORDER — EPHEDRINE 5 MG/ML INJ
INTRAVENOUS | Status: AC
  Filled 2020-06-25: qty 10

## 2020-06-25 MED ORDER — DEXAMETHASONE SODIUM PHOSPHATE 10 MG/ML IJ SOLN
INTRAMUSCULAR | Status: AC
  Filled 2020-06-25: qty 1

## 2020-06-25 MED ORDER — ACETAMINOPHEN 500 MG PO TABS
ORAL_TABLET | ORAL | Status: AC
  Filled 2020-06-25: qty 2

## 2020-06-25 MED ORDER — CHLORHEXIDINE GLUCONATE CLOTH 2 % EX PADS: 6.0000 | MEDICATED_PAD | Freq: Once | CUTANEOUS | Status: DC

## 2020-06-25 MED ORDER — LACTATED RINGERS IV SOLN: INTRAVENOUS | Status: DC

## 2020-06-25 MED ORDER — HYDROCODONE-ACETAMINOPHEN 5-325 MG PO TABS: 1.0000 | ORAL_TABLET | Freq: Four times a day (QID) | ORAL | 0 refills | Status: DC | PRN

## 2020-06-25 MED ORDER — HYDROMORPHONE HCL 1 MG/ML IJ SOLN: 0.2500 mg | INTRAMUSCULAR | Status: DC | PRN

## 2020-06-25 MED ORDER — EPHEDRINE SULFATE 50 MG/ML IJ SOLN
INTRAMUSCULAR | Status: DC | PRN
  Administered 2020-06-25 (×3): 10 mg via INTRAVENOUS

## 2020-06-25 MED ORDER — CEFAZOLIN SODIUM-DEXTROSE 2-4 GM/100ML-% IV SOLN
2.0000 g | INTRAVENOUS | Status: AC
  Administered 2020-06-25: 2 g via INTRAVENOUS

## 2020-06-25 MED ORDER — ONDANSETRON HCL 4 MG/2ML IJ SOLN
INTRAMUSCULAR | Status: AC
  Filled 2020-06-25: qty 2

## 2020-06-25 MED ORDER — PROPOFOL 10 MG/ML IV BOLUS
INTRAVENOUS | Status: DC | PRN
  Administered 2020-06-25: 120 mg via INTRAVENOUS

## 2020-06-25 MED ORDER — DEXAMETHASONE SODIUM PHOSPHATE 10 MG/ML IJ SOLN
INTRAMUSCULAR | Status: DC | PRN
  Administered 2020-06-25: 5 mg via INTRAVENOUS

## 2020-06-25 MED ORDER — PROPOFOL 10 MG/ML IV BOLUS
INTRAVENOUS | Status: AC
  Filled 2020-06-25: qty 20

## 2020-06-25 MED ORDER — GABAPENTIN 300 MG PO CAPS
300.0000 mg | ORAL_CAPSULE | ORAL | Status: AC
  Administered 2020-06-25: 300 mg via ORAL

## 2020-06-25 SURGICAL SUPPLY — 45 items
ADH SKN CLS APL DERMABOND .7 (GAUZE/BANDAGES/DRESSINGS) ×1
APL PRP STRL LF DISP 70% ISPRP (MISCELLANEOUS) ×1
APPLIER CLIP 9.375 MED OPEN (MISCELLANEOUS)
APR CLP MED 9.3 20 MLT OPN (MISCELLANEOUS)
BINDER BREAST MEDIUM (GAUZE/BANDAGES/DRESSINGS) ×2 IMPLANT
BLADE SURG 15 STRL LF DISP TIS (BLADE) ×1 IMPLANT
BLADE SURG 15 STRL SS (BLADE) ×2
CANISTER SUC SOCK COL 7IN (MISCELLANEOUS) IMPLANT
CANISTER SUCT 1200ML W/VALVE (MISCELLANEOUS) IMPLANT
CHLORAPREP W/TINT 26 (MISCELLANEOUS) ×2 IMPLANT
CLIP APPLIE 9.375 MED OPEN (MISCELLANEOUS) IMPLANT
COVER BACK TABLE 60X90IN (DRAPES) ×2 IMPLANT
COVER MAYO STAND STRL (DRAPES) ×2 IMPLANT
COVER PROBE W GEL 5X96 (DRAPES) ×2 IMPLANT
COVER WAND RF STERILE (DRAPES) IMPLANT
DECANTER SPIKE VIAL GLASS SM (MISCELLANEOUS) IMPLANT
DERMABOND ADVANCED (GAUZE/BANDAGES/DRESSINGS) ×1
DERMABOND ADVANCED .7 DNX12 (GAUZE/BANDAGES/DRESSINGS) ×1 IMPLANT
DRAPE LAPAROSCOPIC ABDOMINAL (DRAPES) ×2 IMPLANT
DRAPE UTILITY XL STRL (DRAPES) ×2 IMPLANT
ELECT COATED BLADE 2.86 ST (ELECTRODE) ×2 IMPLANT
ELECT REM PT RETURN 9FT ADLT (ELECTROSURGICAL) ×2
ELECTRODE REM PT RTRN 9FT ADLT (ELECTROSURGICAL) ×1 IMPLANT
GLOVE SURG ENC MOIS LTX SZ6.5 (GLOVE) ×2 IMPLANT
GLOVE SURG ENC MOIS LTX SZ7.5 (GLOVE) ×4 IMPLANT
GLOVE SURG UNDER POLY LF SZ6.5 (GLOVE) ×2 IMPLANT
GOWN STRL REUS W/ TWL LRG LVL3 (GOWN DISPOSABLE) ×3 IMPLANT
GOWN STRL REUS W/TWL LRG LVL3 (GOWN DISPOSABLE) ×6
ILLUMINATOR WAVEGUIDE N/F (MISCELLANEOUS) IMPLANT
KIT MARKER MARGIN INK (KITS) ×2 IMPLANT
LIGHT WAVEGUIDE WIDE FLAT (MISCELLANEOUS) IMPLANT
NEEDLE HYPO 25X1 1.5 SAFETY (NEEDLE) ×2 IMPLANT
NS IRRIG 1000ML POUR BTL (IV SOLUTION) ×2 IMPLANT
PACK BASIN DAY SURGERY FS (CUSTOM PROCEDURE TRAY) ×2 IMPLANT
PENCIL SMOKE EVACUATOR (MISCELLANEOUS) ×2 IMPLANT
SLEEVE SCD COMPRESS KNEE MED (STOCKING) ×2 IMPLANT
SPONGE LAP 18X18 RF (DISPOSABLE) ×2 IMPLANT
SUT MON AB 4-0 PC3 18 (SUTURE) ×2 IMPLANT
SUT SILK 2 0 SH (SUTURE) IMPLANT
SUT VICRYL 3-0 CR8 SH (SUTURE) ×2 IMPLANT
SYR CONTROL 10ML LL (SYRINGE) ×2 IMPLANT
TOWEL GREEN STERILE FF (TOWEL DISPOSABLE) ×2 IMPLANT
TRAY FAXITRON CT DISP (TRAY / TRAY PROCEDURE) ×2 IMPLANT
TUBE CONNECTING 20X1/4 (TUBING) IMPLANT
YANKAUER SUCT BULB TIP NO VENT (SUCTIONS) IMPLANT

## 2020-06-25 NOTE — Interval H&P Note (Signed)
History and Physical Interval Note:  06/25/2020 11:51 AM  Erika Ramsey  has presented today for surgery, with the diagnosis of LEFT BREAST CANCER.  The various methods of treatment have been discussed with the patient and family. After consideration of risks, benefits and other options for treatment, the patient has consented to  Procedure(s): LEFT BREAST LUMPECTOMY WITH RADIOACTIVE SEED LOCALIZATION (Left) as a surgical intervention.  The patient's history has been reviewed, patient examined, no change in status, stable for surgery.  I have reviewed the patient's chart and labs.  Questions were answered to the patient's satisfaction.     [name]

## 2020-06-25 NOTE — Transfer of Care (Signed)
Immediate Anesthesia Transfer of Care Note  Patient: Erika Ramsey  Procedure(s) Performed: LEFT BREAST LUMPECTOMY WITH RADIOACTIVE SEED LOCALIZATION (Left Breast)  Patient Location: PACU  Anesthesia Type:General  Level of Consciousness: drowsy, patient cooperative and responds to stimulation  Airway & Oxygen Therapy: Patient Spontanous Breathing and Patient connected to face mask oxygen  Post-op Assessment: Report given to RN and Post -op Vital signs reviewed and stable  Post vital signs: Reviewed and stable  Last Vitals:  Vitals Value Taken Time  BP    Temp    Pulse    Resp    SpO2      Last Pain:  Vitals:   06/25/20 1124  TempSrc: Oral  PainSc: 0-No pain         Complications: No complications documented.

## 2020-06-25 NOTE — Anesthesia Procedure Notes (Signed)
Procedure Name: LMA Insertion Date/Time: 06/25/2020 12:40 PM Performed by: Glory Buff, CRNA Pre-anesthesia Checklist: Patient identified, Emergency Drugs available, Suction available and Patient being monitored Patient Re-evaluated:Patient Re-evaluated prior to induction Oxygen Delivery Method: Circle system utilized Preoxygenation: Pre-oxygenation with 100% oxygen Induction Type: IV induction LMA: LMA inserted LMA Size: 4.0 Number of attempts: 1 Placement Confirmation: positive ETCO2 Tube secured with: Tape Dental Injury: Teeth and Oropharynx as per pre-operative assessment

## 2020-06-25 NOTE — Discharge Instructions (Signed)
  Post Anesthesia Home Care Instructions  Activity: Get plenty of rest for the remainder of the day. A responsible individual must stay with you for 24 hours following the procedure.  For the next 24 hours, DO NOT: -Drive a car -Paediatric nurse -Drink alcoholic beverages -Take any medication unless instructed by your physician -Make any legal decisions or sign important papers.  Meals: Start with liquid foods such as gelatin or soup. Progress to regular foods as tolerated. Avoid greasy, spicy, heavy foods. If nausea and/or vomiting occur, drink only clear liquids until the nausea and/or vomiting subsides. Call your physician if vomiting continues.  Special Instructions/Symptoms: Your throat may feel dry or sore from the anesthesia or the breathing tube placed in your throat during surgery. If this causes discomfort, gargle with warm salt water. The discomfort should disappear within 24 hours.  If you had a scopolamine patch placed behind your ear for the management of post- operative nausea and/or vomiting:  1. The medication in the patch is effective for 72 hours, after which it should be removed.  Wrap patch in a tissue and discard in the trash. Wash hands thoroughly with soap and water. 2. You may remove the patch earlier than 72 hours if you experience unpleasant side effects which may include dry mouth, dizziness or visual disturbances. 3. Avoid touching the patch. Wash your hands with soap and water after contact with the patch.    No tylenol until 5:30 pm today. No ibuprofen/motrin until 7:30pm today.

## 2020-06-25 NOTE — Anesthesia Preprocedure Evaluation (Addendum)
Anesthesia Evaluation  Patient identified by MRN, date of birth, ID band Patient awake    Reviewed: Allergy & Precautions, NPO status , Patient's Chart, lab work & pertinent test results  Airway Mallampati: I  TM Distance: >3 FB Neck ROM: Full    Dental no notable dental hx. (+) Teeth Intact, Dental Advisory Given   Pulmonary former smoker,  Former smoker, quit smoking 1979   Pulmonary exam normal breath sounds clear to auscultation       Cardiovascular hypertension, Pt. on medications Normal cardiovascular exam Rhythm:Regular Rate:Normal     Neuro/Psych  Neuromuscular disease (tremor) negative psych ROS   GI/Hepatic negative GI ROS, (+)     substance abuse  alcohol use,   Endo/Other  negative endocrine ROS  Renal/GU negative Renal ROS  negative genitourinary   Musculoskeletal  (+) Arthritis , Osteoarthritis,  L knee OA  Right knee done under spinal in 12/2019   Abdominal   Peds  Hematology negative hematology ROS (+) hct 45.2, plt 245   Anesthesia Other Findings   Reproductive/Obstetrics negative OB ROS                             Anesthesia Physical  Anesthesia Plan  ASA: II  Anesthesia Plan: General   Post-op Pain Management:    Induction: Intravenous  PONV Risk Score and Plan: 3 and Ondansetron, Dexamethasone, Midazolam and Treatment may vary due to age or medical condition  Airway Management Planned: LMA  Additional Equipment: None  Intra-op Plan:   Post-operative Plan: Extubation in OR  Informed Consent: I have reviewed the patients History and Physical, chart, labs and discussed the procedure including the risks, benefits and alternatives for the proposed anesthesia with the patient or authorized representative who has indicated his/her understanding and acceptance.       Plan Discussed with: CRNA  Anesthesia Plan Comments:         Anesthesia Quick  Evaluation

## 2020-06-25 NOTE — Op Note (Signed)
06/25/2020  1:19 PM  PATIENT:  Erika Ramsey  75 y.o. female  PRE-OPERATIVE DIAGNOSIS:  LEFT BREAST CANCER  POST-OPERATIVE DIAGNOSIS:  LEFT BREAST CANCER  PROCEDURE:  Procedure(s): LEFT BREAST LUMPECTOMY WITH RADIOACTIVE SEED LOCALIZATION (Left)  SURGEON:  Surgeon(s) and Role:    * [name], MD - Primary  PHYSICIAN ASSISTANT:   ASSISTANTS: none   ANESTHESIA:   local and general  EBL:  Minimal   BLOOD ADMINISTERED:none  DRAINS: none   LOCAL MEDICATIONS USED:  MARCAINE     SPECIMEN:  Source of Specimen:  left breast tissue with additional medial and inferior margin  DISPOSITION OF SPECIMEN:  PATHOLOGY  COUNTS:  YES  TOURNIQUET:  * No tourniquets in log *  DICTATION: .Dragon Dictation   After informed consent was obtained the patient was brought to the operating room and placed in the supine position on the operating table.  After adequate induction of general anesthesia the patient's left breast was prepped with ChloraPrep, allowed to dry, and draped in usual sterile manner.  An appropriate timeout was performed.  Previously an I-125 seed was placed in the upper inner quadrant of the left breast to mark a small area of invasive breast cancer.  The neoprobe was set to I-125 in the area of radioactivity was readily identified.  The area around this was infiltrated with quarter percent Marcaine.  A curvilinear incision was made along the upper inner edge of the areola of the left breast with a 15 blade knife.  The incision was carried through the skin and subcutaneous tissue sharply with electrocautery.  Dissection was then carried out into the upper inner quadrant between the breast tissue and the subcutaneous fat and skin until the dissection was well beyond the area of the cancer.  A wedge of breast tissue was then excised sharply around the radioactive seed while checking the area of radioactivity frequently.  This dissection was carried all the way to the chest wall.   Once the specimen was removed it was oriented with the appropriate paint colors.  A specimen radiograph was obtained that showed the clip and seed to be near the center of the specimen.  I did elect to take an additional medial and inferior margin and these were marked appropriately.  The tissue was then all sent to pathology for further evaluation.  Hemostasis was achieved using the Bovie electrocautery.  The wound was irrigated with saline and infiltrated with more quarter percent Marcaine.  The deep layer of the wound was then closed with layers of interrupted 3-0 Vicryl stitches.  The skin was closed with interrupted 4-0 Monocryl subcuticular stitches.  Dermabond dressings were applied.  The patient tolerated the procedure well.  At the end of the case all needle sponge and instrument counts were correct.  The patient was then awakened and taken to recovery in stable condition.  PLAN OF CARE: Discharge to home after PACU  PATIENT DISPOSITION:  PACU - hemodynamically stable.   Delay start of Pharmacological VTE agent (>24hrs) due to surgical blood loss or risk of bleeding: not applicable

## 2020-06-25 NOTE — Anesthesia Postprocedure Evaluation (Signed)
Anesthesia Post Note  Patient: Erika Ramsey  Procedure(s) Performed: LEFT BREAST LUMPECTOMY WITH RADIOACTIVE SEED LOCALIZATION (Left Breast)     Patient location during evaluation: PACU Anesthesia Type: General Level of consciousness: awake and alert Pain management: pain level controlled Vital Signs Assessment: post-procedure vital signs reviewed and stable Respiratory status: spontaneous breathing, nonlabored ventilation and respiratory function stable Cardiovascular status: blood pressure returned to baseline and stable Postop Assessment: no apparent nausea or vomiting Anesthetic complications: no   No complications documented.  Last Vitals:  Vitals:   06/25/20 1345 06/25/20 1400  BP: (!) 132/59 (!) 131/53  Pulse: 73 73  Resp: 11 13  Temp:    SpO2: 100% 100%    Last Pain:  Vitals:   06/25/20 1421  TempSrc:   PainSc: 0-No pain                 Lynda Rainwater

## 2020-06-25 NOTE — H&P (Signed)
Erika Ramsey  Location: [facility] Patient #: [id number] DOB: [birth year] Undefined / Language: Cleophus Molt / Race: White Female   History of Present Illness The patient is a 75 year old female who presents with breast cancer.We are asked to see the patient in consultation by Dr. Lindi Adie to evaluate her for a new left breast cancer. The patient is a 75 year old white female who presents with a 66m mass in the left breast on screen. The axilla looked neg. 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 She does not smoke   Past Surgical History  Hip Surgery  Right. Knee Surgery  Bilateral.  Diagnostic Studies History  Colonoscopy  1-5 years ago Mammogram  within last year Pap Smear  1-5 years ago  Medication History  Medications Reconciled  Social History  Alcohol use  Moderate alcohol use. No drug use  Tobacco use  Former smoker.  Family History  Colon Cancer  Father. Heart Disease  Father, Mother, Sister. Hypertension  Mother. Melanoma  Mother.  Pregnancy / Birth History  Age at menarche  18years. Age of menopause  [age]Gravida  0 Irregular periods  Para  0  Other Problems Back Pain  Diverticulosis  Hemorrhoids  High blood pressure     Review of Systems General Not Present- Appetite Loss, Chills, Fatigue, Fever, Night Sweats, Weight Gain and Weight Loss. Skin Not Present- Change in Wart/Mole, Dryness, Hives, Jaundice, New Lesions, Non-Healing Wounds, Rash and Ulcer. HEENT Present- Seasonal Allergies and Wears glasses/contact lenses. Not Present- Earache, Hearing Loss, Hoarseness, Nose Bleed, Oral Ulcers, Ringing in the Ears, Sinus Pain, Sore Throat, Visual Disturbances and Yellow Eyes. Respiratory Not Present- Bloody sputum, Chronic Cough, Difficulty Breathing, Snoring and Wheezing. Breast Present- Breast Mass. Not Present- Breast Pain, Nipple Discharge and Skin  Changes. Cardiovascular Not Present- Chest Pain, Difficulty Breathing Lying Down, Leg Cramps, Palpitations, Rapid Heart Rate, Shortness of Breath and Swelling of Extremities. Gastrointestinal Not Present- Abdominal Pain, Bloating, Bloody Stool, Change in Bowel Habits, Chronic diarrhea, Constipation, Difficulty Swallowing, Excessive gas, Gets full quickly at meals, Hemorrhoids, Indigestion, Nausea, Rectal Pain and Vomiting. Female Genitourinary Not Present- Frequency, Nocturia, Painful Urination, Pelvic Pain and Urgency. Musculoskeletal Present- Back Pain. Not Present- Joint Pain, Joint Stiffness, Muscle Pain, Muscle Weakness and Swelling of Extremities. Neurological Not Present- Decreased Memory, Fainting, Headaches, Numbness, Seizures, Tingling, Tremor, Trouble walking and Weakness. Psychiatric Not Present- Anxiety, Bipolar, Change in Sleep Pattern, Depression, Fearful and Frequent crying. Endocrine Not Present- Cold Intolerance, Excessive Hunger, Hair Changes, Heat Intolerance, Hot flashes and New Diabetes. Hematology Not Present- Blood Thinners, Easy Bruising, Excessive bleeding, Gland problems, HIV and Persistent Infections.   Physical Exam  General Mental Status-Alert. General Appearance-Consistent with stated age. Hydration-Well hydrated. Voice-Normal.  Head and Neck Head-normocephalic, atraumatic with no lesions or palpable masses. Trachea-midline. Thyroid Gland Characteristics - normal size and consistency.  Eye Eyeball - Bilateral-Extraocular movements intact. Sclera/Conjunctiva - Bilateral-No scleral icterus.  Chest and Lung Exam Chest and lung exam reveals -quiet, even and easy respiratory effort with no use of accessory muscles and on auscultation, normal breath sounds, no adventitious sounds and normal vocal resonance. Inspection Chest Wall - Normal. Back - normal.  Breast Note: There is a palpable small bruise in the UIQ of the left breast. Other  than this there is no palpable mass in either breast. there is no palpable axillary, supraclavicular, or cervical lymphadenopathy  Cardiovascular Cardiovascular examination reveals -normal heart sounds, regular rate and rhythm with no murmurs and normal pedal pulses bilaterally.  Abdomen Inspection Inspection of the abdomen reveals - No Hernias. Skin - Scar - no surgical scars. Palpation/Percussion Palpation and Percussion of the abdomen reveal - Soft, Non Tender, No Rebound tenderness, No Rigidity (guarding) and No hepatosplenomegaly. Auscultation Auscultation of the abdomen reveals - Bowel sounds normal.  Neurologic Neurologic evaluation reveals -alert and oriented x 3 with no impairment of recent or remote memory. Mental Status-Normal.  Musculoskeletal Normal Exam - Left-Upper Extremity Strength Normal and Lower Extremity Strength Normal. Normal Exam - Right-Upper Extremity Strength Normal and Lower Extremity Strength Normal.  Lymphatic Head & Neck  General Head & Neck Lymphatics: Bilateral - Description - Normal. Axillary  General Axillary Region: Bilateral - Description - Normal. Tenderness - Non Tender. Femoral & Inguinal  Generalized Femoral & Inguinal Lymphatics: Bilateral - Description - Normal. Tenderness - Non Tender.    Assessment & Plan MALIGNANT NEOPLASM OF UPPER-INNER QUADRANT OF LEFT BREAST IN FEMALE, ESTROGEN RECEPTOR POSITIVE (C50.212) Impression: The patient appears to have a small stage 1 cancer in the UIQ of the left breast. I have discussed with her the options for treatment and at this point she favors breast conservation which i feel is very reasonable. Given her age she will not need a node evaluation. I have discussed with her the risks and benefits of the surgery as well as some of the technical aspects including the use of a radioactive seed and she understands and wishes to proceed. She will also meet with med and rad onc to discuss  adjuvant therapyl This patient encounter took 60 minutes today to perform the following: take history, perform exam, review outside records, interpret imaging, counsel the patient on their diagnosis and document encounter, findings & plan in the EHR Current Plans Referred to Oncology, for evaluation and follow up (Oncology). Routine.

## 2020-06-29 ENCOUNTER — Encounter: Payer: Self-pay | Admitting: *Deleted

## 2020-06-29 ENCOUNTER — Encounter (HOSPITAL_BASED_OUTPATIENT_CLINIC_OR_DEPARTMENT_OTHER): Payer: Self-pay | Admitting: General Surgery

## 2020-06-29 LAB — SURGICAL PATHOLOGY

## 2020-07-02 NOTE — Progress Notes (Signed)
Patient Care Team: [name], MD as PCP - General (Family Medicine) Terrance Mass, MD (Inactive) as Consulting Physician (Gynecology) Laurence Spates, MD (Inactive) as Consulting Physician (Gastroenterology) Mauro Kaufmann, RN as Oncology Nurse Navigator Rockwell Germany, RN as Oncology Nurse Navigator [name], MD as Consulting Physician (General Surgery) Nicholas Lose, MD as Consulting Physician (Hematology and Oncology) Gery Pray, MD as Consulting Physician (Radiation Oncology)  DIAGNOSIS:    ICD-10-CM   1. Malignant neoplasm of upper-inner quadrant of left breast in female, estrogen receptor positive ([place])  C50.212    Z17.0     SUMMARY OF ONCOLOGIC HISTORY: Oncology History  Malignant neoplasm of upper-inner quadrant of left breast in female, estrogen receptor positive ([place])  05/20/2020 Initial Diagnosis   Screening mammogram showed a possible left breast mass. Diagnostic mammogram and US showed a 0.4cm mass at the 10 o'clock position in the left breast. Biopsy showed IDC, grade 2, HER-2 equivocal by IHC (2+), ER/PR+ 100%, Ki67 15%.    05/27/2020 Cancer Staging   Staging form: Breast, AJCC 8th Edition - Clinical stage from 05/27/2020: Stage IA (cT1a, cN0, cM0, G2, ER+, PR+, HER2-) - Signed by Nicholas Lose, MD on 05/27/2020 Stage prefix: Initial diagnosis Histologic grading system: 3 grade system Laterality: Left Staged by: Pathologist and managing physician Stage used in treatment planning: Yes National guidelines used in treatment planning: Yes Type of national guideline used in treatment planning: NCCN   06/25/2020 Surgery   Left lumpectomy Marlou Starks): IDC, 0.5cm, clear margins.      CHIEF COMPLIANT: Follow-up s/p left lumpectomy   INTERVAL HISTORY: Erika Ramsey is a 75 y.o. with above-mentioned history of left breast cancer. She underwent a left lumpectomy with Dr. Marlou Starks on 06/25/20 for which pathology showed invasive ductal carcinoma, 0.5cm, clear  margins. She presents to the clinic today to review the pathology report and discuss further treatment.   ALLERGIES:  is allergic to pollen extract and tomato.  MEDICATIONS:  Current Outpatient Medications  Medication Sig Dispense Refill  . anastrozole (ARIMIDEX) 1 MG tablet Take 1 tablet (1 mg total) by mouth daily. 90 tablet 3  . acetaminophen (TYLENOL) 500 MG tablet Take 500 mg by mouth every 6 (six) hours as needed for moderate pain or headache.    Marland Kitchen amLODipine (NORVASC) 5 MG tablet Take 1 tablet (5 mg total) by mouth daily. 30 tablet 0  . Calcium Carb-Cholecalciferol (CALCIUM 600 + D PO) Take 2 tablets by mouth daily.    . chlorthalidone (HYGROTON) 25 MG tablet Take 12.5 mg by mouth daily.    Marland Kitchen guaiFENesin (MUCINEX) 600 MG 12 hr tablet Take 600 mg by mouth 2 (two) times daily as needed (congestion).    Marland Kitchen losartan (COZAAR) 100 MG tablet Take 100 mg by mouth daily.    . magnesium gluconate (MAGONATE) 500 MG tablet Take 500 mg by mouth daily.    . Multiple Vitamin (MULTIVITAMIN) tablet Take 1 tablet by mouth daily.    . Turmeric 500 MG CAPS Take 500 mg by mouth daily.     No current facility-administered medications for this visit.    PHYSICAL EXAMINATION: ECOG PERFORMANCE STATUS: 1 - Symptomatic but completely ambulatory  Vitals:   07/03/20 0909  BP: 140/68  Pulse: 76  Resp: 18  Temp: 97.7 F (36.5 C)  SpO2: 98%   Filed Weights   07/03/20 0909  Weight: 127 lb 4.8 oz (57.7 kg)    LABORATORY DATA:  I have reviewed the data as  listed CMP Latest Ref Rng & Units 06/22/2020 05/27/2020 05/05/2020  Glucose 70 - 99 mg/dL 149(H) 96 181(H)  BUN 8 - 23 mg/dL 16 18 18   Creatinine 0.44 - 1.00 mg/dL 0.86 0.83 0.92  Sodium 135 - 145 mmol/L 135 139 137  Potassium 3.5 - 5.1 mmol/L 3.4(L) 3.7 3.4(L)  Chloride 98 - 111 mmol/L 100 100 105  CO2 22 - 32 mmol/L 25 26 23   Calcium 8.9 - 10.3 mg/dL 9.5 9.1 8.2(L)  Total Protein 6.5 - 8.1 g/dL - 7.2 -  Total Bilirubin 0.3 - 1.2 mg/dL - 0.4 -   Alkaline Phos 38 - 126 U/L - 99 -  AST 15 - 41 U/L - 18 -  ALT 0 - 44 U/L - 16 -    Lab Results  Component Value Date   WBC 6.4 05/27/2020   HGB 12.4 05/27/2020   HCT 38.2 05/27/2020   MCV 95.5 05/27/2020   PLT 367 05/27/2020   NEUTROABS 3.1 05/27/2020    ASSESSMENT & PLAN:  Malignant neoplasm of upper-inner quadrant of left breast in female, estrogen receptor positive ([place]) 05/20/2020:Screening mammogram showed a possible left breast mass. Diagnostic mammogram and US showed a 0.4cm mass at the 10 o'clock position in the left breast. Biopsy showed IDC, grade 2, HER-2 equivocal by IHC (2+), ER/PR+ 100%, Ki67 15%.   Treatment plan: 1.  Breast conserving surgery: 06/25/20: Grade 2 IDC, 0.5 cm, Margins Neg, Er 100%, PR 100%, Her 2 Neg, Ki 67: 15% 2. Based on negative margins and good Prognostic markers, we decided that RT is not necessary 3.  Adjuvant antiestrogen therapy with anastrozole 1 mg daily X 5 years  RTC in 3 months for SCP visit    No orders of the defined types were placed in this encounter.  The patient has a good understanding of the overall plan. she agrees with it. she will call with any problems that may develop before the next visit here.  Total time spent: 30 mins including face to face time and time spent for planning, charting and coordination of care  Rulon Eisenmenger, MD, MPH 07/03/2020  I, Cloyde Reams Dorshimer, am acting as scribe for Dr. Nicholas Lose.  I have reviewed the above documentation for accuracy and completeness, and I agree with the above.

## 2020-07-02 NOTE — Assessment & Plan Note (Signed)
05/20/2020:Screening mammogram showed a possible left breast mass. Diagnostic mammogram and US showed a 0.4cm mass at the 10 o'clock position in the left breast. Biopsy showed IDC, grade 2, HER-2 equivocal by IHC (2+), ER/PR+ 100%, Ki67 15%.   Treatment plan: 1.  Breast conserving surgery: 06/25/20: Grade 2 IDC, 0.5 cm, Margins Neg, Er 100%, PR 100%, Her 2 Neg, Ki 67: 15% 2. Based on negative margins and good Prognostic markers, we decided that RT is not necessary 3.  Adjuvant antiestrogen therapy with anastrozole 1 mg daily X 5 years  RTC in 3 months for SCP visit

## 2020-07-03 ENCOUNTER — Inpatient Hospital Stay: Payer: Medicare PPO | Attending: Hematology and Oncology | Admitting: Hematology and Oncology

## 2020-07-03 ENCOUNTER — Encounter: Payer: Self-pay | Admitting: *Deleted

## 2020-07-03 ENCOUNTER — Other Ambulatory Visit: Payer: Self-pay

## 2020-07-03 DIAGNOSIS — Z79899 Other long term (current) drug therapy: Secondary | ICD-10-CM | POA: Diagnosis not present

## 2020-07-03 DIAGNOSIS — C50212 Malignant neoplasm of upper-inner quadrant of left female breast: Secondary | ICD-10-CM | POA: Insufficient documentation

## 2020-07-03 DIAGNOSIS — Z170 Estrogen receptor positive status [ER+]: Secondary | ICD-10-CM | POA: Insufficient documentation

## 2020-07-03 DIAGNOSIS — Z79811 Long term (current) use of aromatase inhibitors: Secondary | ICD-10-CM | POA: Diagnosis not present

## 2020-07-03 MED ORDER — ANASTROZOLE 1 MG PO TABS: 1.0000 mg | ORAL_TABLET | Freq: Every day | ORAL | 3 refills | Status: DC

## 2020-07-13 ENCOUNTER — Telehealth: Payer: Self-pay | Admitting: Hematology and Oncology

## 2020-07-13 ENCOUNTER — Encounter: Payer: Self-pay | Admitting: Hematology and Oncology

## 2020-07-13 MED ORDER — LETROZOLE 2.5 MG PO TABS: 2.5000 mg | ORAL_TABLET | Freq: Every day | ORAL | 0 refills | Status: DC

## 2020-07-13 NOTE — Telephone Encounter (Signed)
Scheduled appt per 5/16 sch msg. Pt aware.  

## 2020-07-13 NOTE — Telephone Encounter (Signed)
Patient called as with complaints of difficulty with sleeping due to anastrozole.  It got better after she stopped it. Recommendation: Switch to letrozole I will send a 30-day supply and I will call her in 4 weeks to discuss tolerance to letrozole.

## 2020-08-10 ENCOUNTER — Ambulatory Visit (INDEPENDENT_AMBULATORY_CARE_PROVIDER_SITE_OTHER): Payer: Medicare PPO | Admitting: Obstetrics & Gynecology

## 2020-08-10 ENCOUNTER — Other Ambulatory Visit: Payer: Self-pay

## 2020-08-10 ENCOUNTER — Encounter: Payer: Self-pay | Admitting: Obstetrics & Gynecology

## 2020-08-10 VITALS — BP 132/74 | Ht 61.0 in | Wt 127.0 lb

## 2020-08-10 DIAGNOSIS — C50212 Malignant neoplasm of upper-inner quadrant of left female breast: Secondary | ICD-10-CM | POA: Diagnosis not present

## 2020-08-10 DIAGNOSIS — M85852 Other specified disorders of bone density and structure, left thigh: Secondary | ICD-10-CM | POA: Diagnosis not present

## 2020-08-10 DIAGNOSIS — Z170 Estrogen receptor positive status [ER+]: Secondary | ICD-10-CM

## 2020-08-10 DIAGNOSIS — Z01419 Encounter for gynecological examination (general) (routine) without abnormal findings: Secondary | ICD-10-CM | POA: Diagnosis not present

## 2020-08-10 DIAGNOSIS — Z780 Asymptomatic menopausal state: Secondary | ICD-10-CM

## 2020-08-10 DIAGNOSIS — Z9189 Other specified personal risk factors, not elsewhere classified: Secondary | ICD-10-CM

## 2020-08-10 DIAGNOSIS — Z9289 Personal history of other medical treatment: Secondary | ICD-10-CM

## 2020-08-10 DIAGNOSIS — C801 Malignant (primary) neoplasm, unspecified: Secondary | ICD-10-CM | POA: Insufficient documentation

## 2020-08-10 NOTE — Progress Notes (Signed)
HEMATOLOGY-ONCOLOGY TELEPHONE VISIT PROGRESS NOTE  I connected with Erika Ramsey on 08/11/2020 at  3:00 PM EDT by telephone and verified that I am speaking with the correct person using two identifiers.  I discussed the limitations, risks, security and privacy concerns of performing an evaluation and management service by telephone and the availability of in person appointments.  I also discussed with the patient that there may be a patient responsible charge related to this service. The patient expressed understanding and agreed to proceed.   History of Present Illness: Erika Ramsey is a 75 y.o. female with above-mentioned history of left breast cancer. She underwent a left lumpectomy with Dr. Marlou Starks on 06/25/20. Today she reports via telephone for follow-up.  She is tolerating letrozole extremely well without any problems.  She could not tolerate anastrozole.  Oncology History  Malignant neoplasm of upper-inner quadrant of left breast in female, estrogen receptor positive ([place])  05/20/2020 Initial Diagnosis   Screening mammogram showed a possible left breast mass. Diagnostic mammogram and US showed a 0.4cm mass at the 10 o'clock position in the left breast. Biopsy showed IDC, grade 2, HER-2 equivocal by IHC (2+), ER/PR+ 100%, Ki67 15%.    05/27/2020 Cancer Staging   Staging form: Breast, AJCC 8th Edition - Clinical stage from 05/27/2020: Stage IA (cT1a, cN0, cM0, G2, ER+, PR+, HER2-) - Signed by Nicholas Lose, MD on 05/27/2020  Stage prefix: Initial diagnosis  Histologic grading system: 3 grade system  Laterality: Left  Staged by: Pathologist and managing physician  Stage used in treatment planning: Yes  National guidelines used in treatment planning: Yes  Type of national guideline used in treatment planning: NCCN    06/25/2020 Surgery   Left lumpectomy Marlou Starks): IDC, 0.5cm, clear margins.    07/03/2020 -  Anti-estrogen oral therapy   Anastrozole, 25m daily, planned duration 5  years; switched to letrozole 07/13/20 due to difficulty sleeping.     Observations/Objective:     Assessment Plan:  Malignant neoplasm of upper-inner quadrant of left breast in female, estrogen receptor positive ([place] 05/20/2020:Screening mammogram showed a possible left breast mass. Diagnostic mammogram and [place]owed a 0.4cm mass at the 10 o'clock position in the left breast. Biopsy showed IDC, grade 2, HER-2 equivocal by IHC (2+), ER/PR+ 100%, Ki67 15%.    Treatment plan: 1.  Breast conserving surgery: 06/25/20: Grade 2 IDC, 0.5 cm, Margins Neg, Er 100%, PR 100%, Her 2 Neg, Ki 67: 15% 2. Based on negative margins and good Prognostic markers, we decided that RT is not necessary 3.  Adjuvant antiestrogen therapy with anastrozole 1 mg daily X 5 years switched to letrozole May 2022 ---------------------------------------------------------------- Current treatment: Anastrozole 1 mg daily started 07/03/2020 switched to letrozole for intolerance Letrozole toxicities: Denies any adverse effects on letrozole therapy. I sent a new prescription for the 90-day supply. Her gynecologist is obtaining a bone density test.  Return to clinic for survivorship care plan visit.  After that I can see her in 6 months and after that once a year.    I discussed the assessment and treatment plan with the patient. The patient was provided an opportunity to ask questions and all were answered. The patient agreed with the plan and demonstrated an understanding of the instructions. The patient was advised to call back or seek an in-person evaluation if the symptoms worsen or if the condition fails to improve as anticipated.   Total time spent: 12 mins including non-face to face time and time spent for  planning, charting and coordination of care  Rulon Eisenmenger, MD 08/11/2020    I, Thana Ates, am acting as scribe for Nicholas Lose, MD.  I have reviewed the above documentation for accuracy and completeness, and I  agree with the above.

## 2020-08-10 NOTE — Progress Notes (Addendum)
Erika Ramsey 1945-09-02 [id number]   History:    75 y.o. G0 Companion living around [place].   RP:  Established patient presenting for annual gyn exam   HPI: Postmenopause, well without HRT.  No PMB. No pelvic pain.  Normal vaginal secretions.  Abstinent.  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 Urine and bowel movements normal.  Dxed with early left Breast Ca, left Lumpectomy in 04/2020.  Started on Anastrazole.  Breasts normal now.  Had bilateral Knee replacements.  Health labs with family physician. BMI 24. Very active.  Osteopenia on BD in 2019. Colono 2018.    Past medical history,surgical history, family history and social history were all reviewed and documented in the EPIC chart.  Gynecologic History No LMP recorded. Patient is postmenopausal.  Obstetric History OB History  Gravida Para Term Preterm AB Living  0 0 0 0 0 0  SAB IAB Ectopic Multiple Live Births  0 0 0 0 0     ROS: A ROS was performed and pertinent positives and negatives are included in the history.  GENERAL: No fevers or chills. HEENT: No change in vision, no earache, sore throat or sinus congestion. NECK: No pain or stiffness. CARDIOVASCULAR: No chest pain or pressure. No palpitations. PULMONARY: No shortness of breath, cough or wheeze. GASTROINTESTINAL: No abdominal pain, nausea, vomiting or diarrhea, melena or bright red blood per rectum. GENITOURINARY: No urinary frequency, urgency, hesitancy or dysuria. MUSCULOSKELETAL: No joint or muscle pain, no back pain, no recent trauma. DERMATOLOGIC: No rash, no itching, no lesions. ENDOCRINE: No polyuria, polydipsia, no heat or cold intolerance. No recent change in weight. HEMATOLOGICAL: No anemia or easy bruising or bleeding. NEUROLOGIC: No headache, seizures, numbness, tingling or weakness. PSYCHIATRIC: No depression, no loss of interest in normal activity or change in sleep pattern.     Exam:   BP 132/74   Ht 5\' 1"  (1.549 m)   Wt 127 lb  (57.6 kg)   BMI 24.00 kg/m   Body mass index is 24 kg/m.  General appearance : Well developed well nourished female. No acute distress HEENT: Eyes: no retinal hemorrhage or exudates,  Neck supple, trachea midline, no carotid bruits, no thyroidmegaly Lungs: Clear to auscultation, no rhonchi or wheezes, or rib retractions  Heart: Regular rate and rhythm, no murmurs or gallops Breast:Examined in sitting and supine position were symmetrical in appearance, no palpable masses or tenderness,  no skin retraction, no nipple inversion, no nipple discharge, no skin discoloration, no axillary or supraclavicular lymphadenopathy Abdomen: no palpable masses or tenderness, no rebound or guarding Extremities: no edema or skin discoloration or tenderness  Pelvic: Vulva: Normal             Vagina: No gross lesions or discharge  Cervix: No gross lesions or discharge  Uterus  AV, normal size, shape and consistency, non-tender and mobile  Adnexa  Without masses or tenderness  Anus: Normal   Assessment/Plan:  75 y.o. female for annual exam   1. Well female gynecological exam at increased risk for Cancer Normal gynecologic exam in menopause.  Last Pap test was negative in April 2021, will repeat a Pap test next year at patient's preference.  History of abnormal Pap test more than 30 years ago.  Breast exam normal.  Screening mammogram February 2022 negative.  Colonoscopy 2018.  Health labs with family physician.  Good body mass index at 24.  Continue with fitness and healthy nutrition.  Doing very well post bilateral  knee replacements this year.  2. Postmenopause Well on no hormone replacement therapy.  No postmenopausal bleeding.  3. Osteopenia of neck of left femur History of osteopenia, will repeat a bone density now.  Vitamin D supplements, calcium intake of 1.5 g/day total and regular weightbearing physical activities. - DG Bone Density; Future  4. Malignant neoplasm of upper-inner quadrant of left  breast in female, estrogen receptor positive ([place])  Had a left lumpectomy.  On anastrozole.   Princess Bruins MD, 2:41 PM 08/10/2020

## 2020-08-11 ENCOUNTER — Inpatient Hospital Stay: Payer: Medicare PPO | Attending: Hematology and Oncology | Admitting: Hematology and Oncology

## 2020-08-11 DIAGNOSIS — C50212 Malignant neoplasm of upper-inner quadrant of left female breast: Secondary | ICD-10-CM | POA: Diagnosis not present

## 2020-08-11 DIAGNOSIS — Z170 Estrogen receptor positive status [ER+]: Secondary | ICD-10-CM

## 2020-08-11 MED ORDER — LETROZOLE 2.5 MG PO TABS: 2.5000 mg | ORAL_TABLET | Freq: Every day | ORAL | 3 refills | Status: DC

## 2020-08-11 NOTE — Assessment & Plan Note (Signed)
05/20/2020:Screening mammogram showed a possible left breast mass. Diagnostic mammogram and US showed a 0.4cm mass at the 10 o'clock position in the left breast. Biopsy showed IDC, grade 2, HER-2 equivocal by IHC (2+), ER/PR+ 100%, Ki67 15%.  Treatment plan: 1.Breast conserving surgery: 06/25/20: Grade 2 IDC, 0.5 cm, Margins Neg, Er 100%, PR 100%, Her 2 Neg, Ki 67: 15% 2. Based on negative margins and good Prognostic markers, we decided that RT is not necessary 3.Adjuvant antiestrogen therapy with anastrozole 1 mg daily X 5 years ---------------------------------------------------------------- Current treatment: Anastrozole 1 mg daily started 07/03/2020 Anastrozole toxicities:  Return to clinic for survivorship care plan visit

## 2020-09-22 ENCOUNTER — Ambulatory Visit (INDEPENDENT_AMBULATORY_CARE_PROVIDER_SITE_OTHER): Payer: Medicare PPO

## 2020-09-22 ENCOUNTER — Other Ambulatory Visit: Payer: Self-pay

## 2020-09-22 ENCOUNTER — Other Ambulatory Visit: Payer: Self-pay | Admitting: Obstetrics & Gynecology

## 2020-09-22 DIAGNOSIS — Z780 Asymptomatic menopausal state: Secondary | ICD-10-CM | POA: Diagnosis not present

## 2020-09-22 DIAGNOSIS — M85852 Other specified disorders of bone density and structure, left thigh: Secondary | ICD-10-CM

## 2020-10-15 ENCOUNTER — Other Ambulatory Visit: Payer: Self-pay

## 2020-10-15 ENCOUNTER — Inpatient Hospital Stay: Payer: Medicare PPO | Attending: Hematology and Oncology | Admitting: Adult Health

## 2020-10-15 VITALS — BP 146/69 | HR 71 | Temp 97.7°F | Resp 18 | Ht 61.0 in | Wt 127.8 lb

## 2020-10-15 DIAGNOSIS — Z170 Estrogen receptor positive status [ER+]: Secondary | ICD-10-CM | POA: Diagnosis not present

## 2020-10-15 DIAGNOSIS — C50212 Malignant neoplasm of upper-inner quadrant of left female breast: Secondary | ICD-10-CM | POA: Diagnosis not present

## 2020-10-15 DIAGNOSIS — Z79811 Long term (current) use of aromatase inhibitors: Secondary | ICD-10-CM | POA: Diagnosis not present

## 2020-10-15 DIAGNOSIS — I10 Essential (primary) hypertension: Secondary | ICD-10-CM | POA: Diagnosis not present

## 2020-10-15 DIAGNOSIS — Z87891 Personal history of nicotine dependence: Secondary | ICD-10-CM | POA: Diagnosis not present

## 2020-10-15 DIAGNOSIS — Z79899 Other long term (current) drug therapy: Secondary | ICD-10-CM | POA: Diagnosis not present

## 2020-10-15 NOTE — Progress Notes (Signed)
SURVIVORSHIP VIRTUAL VISIT:  I BRIEF ONCOLOGIC HISTORY:  Oncology History  Malignant neoplasm of upper-inner quadrant of left breast in female, estrogen receptor positive ([place])  05/20/2020 Initial Diagnosis   Screening mammogram showed a possible left breast mass. Diagnostic mammogram and US showed a 0.4cm mass at the 10 o'clock position in the left breast. Biopsy showed IDC, grade 2, HER-2 equivocal by IHC (2+), ER/PR+ 100%, Ki67 15%.    05/27/2020 Cancer Staging   Staging form: Breast, AJCC 8th Edition - Clinical stage from 05/27/2020: Stage IA (cT1a, cN0, cM0, G2, ER+, PR+, HER2-) - Signed by Nicholas Lose, MD on 05/27/2020 Stage prefix: Initial diagnosis Histologic grading system: 3 grade system Laterality: Left Staged by: Pathologist and managing physician Stage used in treatment planning: Yes National guidelines used in treatment planning: Yes Type of national guideline used in treatment planning: NCCN   06/25/2020 Surgery   Left lumpectomy Marlou Starks): IDC, 0.5cm, clear margins.    06/25/2020 Cancer Staging   Staging form: Breast, AJCC 8th Edition - Pathologic stage from 06/25/2020: Stage IA (pT1a, pN0, cM0, G2, ER+, PR+, HER2-) - Signed by Gardenia Phlegm, NP on 10/06/2020 Stage prefix: Initial diagnosis Histologic grading system: 3 grade system   07/03/2020 -  Anti-estrogen oral therapy   Anastrozole, 27m daily, planned duration 5 years; switched to letrozole 07/13/20 due to difficulty sleeping.     INTERVAL HISTORY:  Erika Ramsey review her survivorship care plan detailing her treatment course for breast cancer, as well as monitoring long-term side effects of that treatment, education regarding health maintenance, screening, and overall wellness and health promotion.     Overall, Erika Ramsey reports feeling quite well.  She had difficulty sleeping at night while taking anastrozole and has since changed to letrozole with improvement in her sleep.  She continues on it with good  tolerance.    REVIEW OF SYSTEMS:  Review of Systems  Constitutional:  Negative for appetite change, chills, fatigue, fever and unexpected weight change.  HENT:   Negative for hearing loss, lump/mass and trouble swallowing.   Eyes:  Negative for eye problems and icterus.  Respiratory:  Negative for chest tightness, cough and shortness of breath.   Cardiovascular:  Negative for chest pain, leg swelling and palpitations.  Gastrointestinal:  Negative for abdominal distention, abdominal pain, constipation, diarrhea, nausea and vomiting.  Endocrine: Negative for hot flashes.  Genitourinary:  Negative for difficulty urinating.   Musculoskeletal:  Negative for arthralgias.  Skin:  Negative for itching and rash.  Neurological:  Negative for dizziness, extremity weakness, headaches and numbness.  Hematological:  Negative for adenopathy. Does not bruise/bleed easily.  Psychiatric/Behavioral:  Negative for depression. The patient is not nervous/anxious.   Breast: Denies any new nodularity, masses, tenderness, nipple changes, or nipple discharge. =  ONCOLOGY TREATMENT TEAM:  1. [facility]  [facility] 2. Medical Oncologist: Dr. TMarlou Starks 3. Radiation Oncologist: Dr. 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   Decreased libido    Hypertension    Neuromuscular disorder ([place]    tremors   Osteopenia 08/2017   T score -1.6 FRAX 11% / 2% stable from prior DEXA   Pneumonia    HX OF    Shingles    Past Surgical History:  Procedure Laterality Date   BREAST CYST ASPIRATION     BREAST LUMPECTOMY WITH RADIOACTIVE SEED LOCALIZATION Left 06/25/2020   Procedure: LEFT BREAST LUMPECTOMY WITH RADIOACTIVE SEED LOCALIZATION;  Surgeon: [name], MD;  Location: [facility];  Service: General;   Laterality: Left;   COLPOSCOPY  1977   CIN 3   HAND SURGERY     KNEE SURGERY  2019   PELVIC LAPAROSCOPY     Diag Lap   TOTAL HIP ARTHROPLASTY Right 05/04/2016   Procedure: RIGHT TOTAL HIP ARTHROPLASTY ANTERIOR APPROACH;  Surgeon: [name], MD;  Location: [facility];  Service: Orthopedics;  Laterality: Right;   TOTAL KNEE ARTHROPLASTY Right 01/27/2020   Procedure: TOTAL KNEE ARTHROPLASTY;  Surgeon: [name], MD;  Location: [facility];  Service: Orthopedics;  Laterality: Right;  92mn   TOTAL KNEE ARTHROPLASTY Left 05/04/2020   Procedure: TOTAL KNEE ARTHROPLASTY;  Surgeon: AGaynelle Arabian MD;  Location: [facility];  Service: Orthopedics;  Laterality: Left;  573m     ALLERGIES:  Allergies  Allergen Reactions   Pollen Extract    Tomato     Cheek swells when eating a large amount      CURRENT MEDICATIONS:  Outpatient Encounter Medications as of 10/15/2020  Medication Sig   acetaminophen (TYLENOL) 500 MG tablet Take 500 mg by mouth every 6 (six) hours as needed for moderate pain or headache.   amLODipine (NORVASC) 5 MG tablet Take 1 tablet (5 mg total) by mouth daily.   Calcium Carb-Cholecalciferol (CALCIUM 600 + D PO) Take 2 tablets by mouth daily.   chlorthalidone (HYGROTON) 25 MG tablet Take 12.5 mg by mouth daily.   guaiFENesin (MUCINEX) 600 MG 12 hr tablet Take 600 mg by mouth 2 (two) times daily as needed (congestion).   letrozole (FEMARA) 2.5 MG tablet Take 1 tablet (2.5 mg total) by mouth daily.   losartan (COZAAR) 100 MG tablet Take 100 mg by mouth daily.   magnesium gluconate (MAGONATE) 500 MG tablet Take 500 mg by mouth daily.   Multiple Vitamin (MULTIVITAMIN) tablet Take 1 tablet by mouth daily.   Turmeric 500 MG CAPS Take 500 mg by mouth daily.   No facility-administered encounter medications on file as of 10/15/2020.     ONCOLOGIC FAMILY HISTORY:  Family History  Problem Relation Age of Onset   Hypertension Mother    Heart disease Mother    Skin cancer Mother     Cancer Father        Colon cancer   Heart disease Father    Allergic rhinitis Father    Diabetes Maternal Grandmother    Cancer Paternal Grandfather        Colon cancer   Breast cancer Maternal Aunt        Great aunt 7020's Allergic rhinitis Sister    Stomach cancer Maternal Grandfather    Angioedema Neg Hx    Asthma Neg Hx    Atopy Neg Hx    Eczema Neg Hx    Immunodeficiency Neg Hx    Urticaria Neg Hx       SOCIAL HISTORY:  Social History   Socioeconomic History   Marital status: Divorced    Spouse name: Not on file   Number of children: Not on file   Years of education: Not on file   Highest education level: Not on file  Occupational History   Not on file  Tobacco Use   Smoking status: Former    Types: Cigarettes  Quit date: 70    Years since quitting: 43.6   Smokeless tobacco: Never  Vaping Use   Vaping Use: Never used  Substance and Sexual Activity   Alcohol use: Yes    Alcohol/week: 7.0 standard drinks    Types: 6 Standard drinks or equivalent, 1 Cans of beer per week    Comment: 1 BEER DAILY    Drug use: No   Sexual activity: Not Currently    Partners: Male    Birth control/protection: Post-menopausal    Comment: intercourse age 32, sexual partners less than 5  Other Topics Concern   Not on file  Social History Narrative   Not on file   Social Determinants of Health   Financial Resource Strain: Not on file  Food Insecurity: Not on file  Transportation Needs: Not on file  Physical Activity: Not on file  Stress: Not on file  Social Connections: Not on file  Intimate Partner Violence: Not on file     OBSERVATIONS/OBJECTIVE:  BP (!) 146/69 (BP Location: Left Arm, Patient Position: Sitting)   Pulse 71   Temp 97.7 F (36.5 C) (Temporal)   Resp 18   Ht 5' 1"  (1.549 m)   Wt 127 lb 12.8 oz (58 kg)   SpO2 97%   BMI 24.15 kg/m  GENERAL: Patient is a well appearing female in no acute distress HEENT:  Sclerae anicteric.  Oropharynx clear and  moist. No ulcerations or evidence of oropharyngeal candidiasis. Neck is supple.  NODES:  No cervical, supraclavicular, or axillary lymphadenopathy palpated.  BREAST EXAM:  left breast s/p lumpectomy, no sign of local recurrence, right breast benign LUNGS:  Clear to auscultation bilaterally.  No wheezes or rhonchi. HEART:  Regular rate and rhythm. No murmur appreciated. ABDOMEN:  Soft, nontender.  Positive, normoactive bowel sounds. No organomegaly palpated. MSK:  No focal spinal tenderness to palpation. Full range of motion bilaterally in the upper extremities. EXTREMITIES:  No peripheral edema.   SKIN:  Clear with no obvious rashes or skin changes. No nail dyscrasia. NEURO:  Nonfocal. Well oriented.  Appropriate affect.   LABORATORY DATA:  None for this visit.  DIAGNOSTIC IMAGING:  None for this visit.      ASSESSMENT AND PLAN:  Ms.. Erika Ramsey is a pleasant 75 y.o. female with Stage IA left breast invasive ductal carcinoma, ER+/PR+/HER2-, diagnosed in 04/2020, treated with lumpectomy, and anti-estrogen therapy with Letrozole (after anastrozole) beginning in 06/2020.  She presents to the Survivorship Clinic for our initial meeting and routine follow-up post-completion of treatment for breast cancer.    1. Stage IA left breast cancer:  Ms. Labell is continuing to recover from definitive treatment for breast cancer. She will follow-up with her medical oncologist, Dr. Lindi Adie in 6 months with history and physical exam per surveillance protocol.  She will continue her anti-estrogen therapy with Letrozole. Thus far, she is tolerating the Letrozole well, with minimal side effects. She was instructed to make Dr. Lindi Adie or myself aware if she begins to experience any worsening side effects of the medication and I could see her back in clinic to help manage those side effects, as needed. Her mammogram is due 03/2021; orders placed today. Today, a comprehensive survivorship care plan and treatment summary was  reviewed with the patient today detailing her breast cancer diagnosis, treatment course, potential late/long-term effects of treatment, appropriate follow-up care with recommendations for the future, and patient education resources.  A copy of this summary, along with a letter will be sent to  the patient's primary care provider via mail/fax/In Basket message after today's visit.    2. Bone health:  Given Ms. Spare's age/history of breast cancer and her current treatment regimen including anti-estrogen therapy with Letrozole, she is at risk for bone demineralization.  Her last DEXA scan was 08/2020, which showed osteoporosis.  She is going to f/u with Dr. Dellis Filbert about recommendations.  She will be due for repeat testing in 08/2022.  In the meantime, she was encouraged to increase her consumption of foods rich in calcium, as well as increase her weight-bearing activities.  She was given education on specific activities to promote bone health.  3. Cancer screening:  Due to Ms. Pen's history and her age, she should receive screening for skin cancers, colon cancer, and gynecologic cancers.  The information and recommendations are listed on the patient's comprehensive care plan/treatment summary and were reviewed in detail with the patient.    4. Health maintenance and wellness promotion: Ms. Coachman was encouraged to consume 5-7 servings of fruits and vegetables per day. She was also encouraged to engage in moderate to vigorous exercise for 30 minutes per day most days of the week. We discussed the [employer] fitness program, which is designed for cancer survivors to help them become more physically fit after cancer treatments.  She was instructed to limit her alcohol consumption and continue to abstain from tobacco use.      5. Support services/counseling: It is not uncommon for this period of the patient's cancer care trajectory to be one of many emotions and stressors.    She was given information  regarding our available services and encouraged to contact me with any questions or for help enrolling in any of our support group/programs.    Follow up instructions:    -Return to cancer center 6 months for f/u with Dr. Lindi Adie  -Mammogram due in 03/2021 -Bone density 08/2022 -Follow up with surgery 1 year -She is welcome to return back to the Survivorship Clinic at any time; no additional follow-up needed at this time.  -Consider referral back to survivorship as a long-term survivor for continued surveillance  The patient was provided an opportunity to ask questions and all were answered. The patient agreed with the plan and demonstrated an understanding of the instructions.   Total encounter time: 45 minutes of face to face visit time, chart review, order entry, care coordination and documentation of the encounter.  Wilber Bihari, NP 10/16/20 10:03 AM Medical Oncology and Hematology [facility] [place], [place] [address] Tel. [phone]    Fax. [phone]  *Total Encounter Time as defined by the Centers for Medicare and Medicaid Services includes, in addition to the face-to-face time of a patient visit (documented in the note above) non-face-to-face time: obtaining and reviewing outside history, ordering and reviewing medications, tests or procedures, care coordination (communications with other health care professionals or caregivers) and documentation in the medical record.

## 2020-10-16 ENCOUNTER — Encounter: Payer: Self-pay | Admitting: Adult Health

## 2020-10-24 ENCOUNTER — Encounter: Payer: Self-pay | Admitting: Hematology and Oncology

## 2021-04-13 ENCOUNTER — Encounter: Payer: Self-pay | Admitting: Hematology and Oncology

## 2021-04-16 ENCOUNTER — Ambulatory Visit
Admission: RE | Admit: 2021-04-16 | Discharge: 2021-04-16 | Disposition: A | Discharge status: Home / Self Care | Payer: Medicare PPO | Source: Ambulatory Visit | Attending: Adult Health | Admitting: Adult Health

## 2021-04-16 DIAGNOSIS — Z170 Estrogen receptor positive status [ER+]: Secondary | ICD-10-CM

## 2021-04-16 DIAGNOSIS — C50212 Malignant neoplasm of upper-inner quadrant of left female breast: Secondary | ICD-10-CM

## 2021-04-19 NOTE — Progress Notes (Signed)
Patient Care Team: [name], MD as PCP - General (Family Medicine) Terrance Mass, MD (Inactive) as Consulting Physician (Gynecology) Laurence Spates, MD (Inactive) as Consulting Physician (Gastroenterology) [name], MD as Consulting Physician (General Surgery) Nicholas Lose, MD as Consulting Physician (Hematology and Oncology) Gery Pray, MD as Consulting Physician (Radiation Oncology)  DIAGNOSIS:    ICD-10-CM   1. Malignant neoplasm of upper-inner quadrant of left breast in female, estrogen receptor positive ([place])  C50.212    Z17.0       SUMMARY OF ONCOLOGIC HISTORY: Oncology History  Malignant neoplasm of upper-inner quadrant of left breast in female, estrogen receptor positive ([place])  05/20/2020 Initial Diagnosis   Screening mammogram showed a possible left breast mass. Diagnostic mammogram and US showed a 0.4cm mass at the 10 o'clock position in the left breast. Biopsy showed IDC, grade 2, HER-2 equivocal by IHC (2+), ER/PR+ 100%, Ki67 15%.    05/27/2020 Cancer Staging   Staging form: Breast, AJCC 8th Edition - Clinical stage from 05/27/2020: Stage IA (cT1a, cN0, cM0, G2, ER+, PR+, HER2-) - Signed by Nicholas Lose, MD on 05/27/2020 Stage prefix: Initial diagnosis Histologic grading system: 3 grade system Laterality: Left Staged by: Pathologist and managing physician Stage used in treatment planning: Yes National guidelines used in treatment planning: Yes Type of national guideline used in treatment planning: NCCN    06/25/2020 Surgery   Left lumpectomy Marlou Starks): IDC, 0.5cm, clear margins.    06/25/2020 Cancer Staging   Staging form: Breast, AJCC 8th Edition - Pathologic stage from 06/25/2020: Stage IA (pT1a, pN0, cM0, G2, ER+, PR+, HER2-) - Signed by Gardenia Phlegm, NP on 10/06/2020 Stage prefix: Initial diagnosis Histologic grading system: 3 grade system    07/03/2020 -  Anti-estrogen oral therapy   Anastrozole, 56m daily, planned duration 5  years; switched to letrozole 07/13/20 due to difficulty sleeping.     CHIEF COMPLIANT: Follow-up of left breast cancer  INTERVAL HISTORY: Erika TENCHis a 76y.o. with above-mentioned history of left breast cancer having undergone lumpectomy, currently on antiestrogen therapy with letrozole. Mammogram on 04/16/2021 showed no evidence of malignancy. She presents to the clinic today for follow-up.  She is tolerating the treatment extremely well without any problems or concerns.  Denies any lumps or nodules in the breast.  ALLERGIES:  is allergic to pollen extract and tomato.  MEDICATIONS:  Current Outpatient Medications  Medication Sig Dispense Refill   acetaminophen (TYLENOL) 500 MG tablet Take 500 mg by mouth every 6 (six) hours as needed for moderate pain or headache.     amLODipine (NORVASC) 5 MG tablet Take 1 tablet (5 mg total) by mouth daily. 30 tablet 0   Calcium Carb-Cholecalciferol (CALCIUM 600 + D PO) Take 2 tablets by mouth daily.     chlorthalidone (HYGROTON) 25 MG tablet Take 12.5 mg by mouth daily.     guaiFENesin (MUCINEX) 600 MG 12 hr tablet Take 600 mg by mouth 2 (two) times daily as needed (congestion).     letrozole (FEMARA) 2.5 MG tablet Take 1 tablet (2.5 mg total) by mouth daily. 90 tablet 3   losartan (COZAAR) 100 MG tablet Take 100 mg by mouth daily.     magnesium gluconate (MAGONATE) 500 MG tablet Take 500 mg by mouth daily.     Multiple Vitamin (MULTIVITAMIN) tablet Take 1 tablet by mouth daily.     Turmeric 500 MG CAPS Take 500 mg by mouth daily.     No current facility-administered  medications for this visit.    PHYSICAL EXAMINATION: ECOG PERFORMANCE STATUS: 1 - Symptomatic but completely ambulatory  Vitals:   04/20/21 1340  BP: (!) 161/74  Pulse: 78  Resp: 18  Temp: 97.7 F (36.5 C)  SpO2: 98%   Filed Weights   04/20/21 1340  Weight: 133 lb 14.4 oz (60.7 kg)    BREAST: No palpable masses or nodules in either right or left breasts. No palpable  axillary supraclavicular or infraclavicular adenopathy no breast tenderness or nipple discharge. (exam performed in the presence of a chaperone)  LABORATORY DATA:  I have reviewed the data as listed CMP Latest Ref Rng & Units 06/22/2020 05/27/2020 05/05/2020  Glucose 70 - 99 mg/dL 149(H) 96 181(H)  BUN 8 - 23 mg/dL 16 18 18   Creatinine 0.44 - 1.00 mg/dL 0.86 0.83 0.92  Sodium 135 - 145 mmol/L 135 139 137  Potassium 3.5 - 5.1 mmol/L 3.4(L) 3.7 3.4(L)  Chloride 98 - 111 mmol/L 100 100 105  CO2 22 - 32 mmol/L 25 26 23   Calcium 8.9 - 10.3 mg/dL 9.5 9.1 8.2(L)  Total Protein 6.5 - 8.1 g/dL - 7.2 -  Total Bilirubin 0.3 - 1.2 mg/dL - 0.4 -  Alkaline Phos 38 - 126 U/L - 99 -  AST 15 - 41 U/L - 18 -  ALT 0 - 44 U/L - 16 -    Lab Results  Component Value Date   WBC 6.4 05/27/2020   HGB 12.4 05/27/2020   HCT 38.2 05/27/2020   MCV 95.5 05/27/2020   PLT 367 05/27/2020   NEUTROABS 3.1 05/27/2020    ASSESSMENT & PLAN:  Malignant neoplasm of upper-inner quadrant of left breast in female, estrogen receptor positive ([place]) 05/20/2020:Screening mammogram showed a possible left breast mass. Diagnostic mammogram and US showed a 0.4cm mass at the 10 o'clock position in the left breast. Biopsy showed IDC, grade 2, HER-2 equivocal by IHC (2+), ER/PR+ 100%, Ki67 15%.    Treatment plan: 1.  Breast conserving surgery: 06/25/20: Grade 2 IDC, 0.5 cm, Margins Neg, Er 100%, PR 100%, Her 2 Neg, Ki 67: 15% 2. Based on negative margins and good Prognostic markers, we decided that RT is not necessary 3.  Adjuvant antiestrogen therapy with anastrozole 1 mg daily X 5 years switched to letrozole May 2022 ---------------------------------------------------------------- Current treatment: Anastrozole 1 mg daily started 07/03/2020 switched to letrozole for intolerance Letrozole toxicities: Denies any adverse effects on letrozole therapy. . Her gynecologist is obtaining a bone density test.  Breast cancer surveillance: 1.   Breast exam 04/20/2021: Benign 2. mammogram 04/16/2021: Benign breast density category B  Return to clinic in 1 year for follow-up  No orders of the defined types were placed in this encounter.  The patient has a good understanding of the overall plan. she agrees with it. she will call with any problems that may develop before the next visit here.  Total time spent: 20 mins including face to face time and time spent for planning, charting and coordination of care  Rulon Eisenmenger, MD, MPH 04/20/2021  I, Thana Ates, am acting as scribe for Dr. Nicholas Lose.  I have reviewed the above documentation for accuracy and completeness, and I agree with the above.

## 2021-04-20 ENCOUNTER — Other Ambulatory Visit: Payer: Self-pay

## 2021-04-20 ENCOUNTER — Inpatient Hospital Stay: Payer: Medicare PPO | Attending: Hematology and Oncology | Admitting: Hematology and Oncology

## 2021-04-20 DIAGNOSIS — C50212 Malignant neoplasm of upper-inner quadrant of left female breast: Secondary | ICD-10-CM | POA: Diagnosis not present

## 2021-04-20 DIAGNOSIS — Z79811 Long term (current) use of aromatase inhibitors: Secondary | ICD-10-CM | POA: Insufficient documentation

## 2021-04-20 DIAGNOSIS — Z170 Estrogen receptor positive status [ER+]: Secondary | ICD-10-CM | POA: Diagnosis not present

## 2021-04-20 DIAGNOSIS — Z79899 Other long term (current) drug therapy: Secondary | ICD-10-CM | POA: Diagnosis not present

## 2021-04-20 MED ORDER — CALCIUM CARB-CHOLECALCIFEROL 600-5 MG-MCG PO TABS: 1.0000 | ORAL_TABLET | Freq: Every day | ORAL | Status: DC

## 2021-04-20 NOTE — Assessment & Plan Note (Signed)
05/20/2020:Screening mammogram showed a possible left breast mass. Diagnostic mammogram and US showed a 0.4cm mass at the 10 o'clock position in the left breast. Biopsy showed IDC, grade 2, HER-2 equivocal by IHC (2+), ER/PR+ 100%, Ki67 15%.  Treatment plan: 1.Breast conserving surgery: 06/25/20: Grade 2 IDC, 0.5 cm, Margins Neg, Er 100%, PR 100%, Her 2 Neg, Ki 67: 15% 2.Based on negative margins and good Prognostic markers, we decided that RT is not necessary 3.Adjuvant antiestrogen therapywith anastrozole 1 mg daily X 5 years switched to letrozole May 2022 ---------------------------------------------------------------- Current treatment: Anastrozole 1 mg daily started 07/03/2020 switched to letrozole for intolerance Letrozole toxicities: Denies any adverse effects on letrozole therapy. . Her gynecologist is obtaining a bone density test.  Breast cancer surveillance: 1.  Breast exam 04/20/2021: Benign 2. mammogram 04/16/2021: Benign breast density category B  Return to clinic in 1 year for follow-up

## 2021-04-21 ENCOUNTER — Telehealth: Payer: Self-pay | Admitting: Hematology and Oncology

## 2021-04-21 NOTE — Telephone Encounter (Signed)
Scheduled appointment per 2/21 los. Left message.

## 2021-05-11 ENCOUNTER — Other Ambulatory Visit: Payer: Self-pay

## 2021-05-11 ENCOUNTER — Other Ambulatory Visit: Payer: Self-pay | Admitting: Family Medicine

## 2021-05-11 ENCOUNTER — Ambulatory Visit
Admission: RE | Admit: 2021-05-11 | Discharge: 2021-05-11 | Disposition: A | Discharge status: Home / Self Care | Payer: Medicare PPO | Source: Ambulatory Visit | Attending: Family Medicine | Admitting: Family Medicine

## 2021-05-11 DIAGNOSIS — R053 Chronic cough: Secondary | ICD-10-CM

## 2021-06-02 ENCOUNTER — Encounter (HOSPITAL_COMMUNITY): Payer: Self-pay

## 2021-08-08 ENCOUNTER — Other Ambulatory Visit: Payer: Self-pay | Admitting: Hematology and Oncology

## 2021-10-06 ENCOUNTER — Ambulatory Visit (INDEPENDENT_AMBULATORY_CARE_PROVIDER_SITE_OTHER): Payer: Medicare PPO | Admitting: Obstetrics & Gynecology

## 2021-10-06 ENCOUNTER — Other Ambulatory Visit (HOSPITAL_COMMUNITY)
Admission: RE | Admit: 2021-10-06 | Discharge: 2021-10-06 | Disposition: A | Discharge status: Home / Self Care | Payer: Medicare PPO | Source: Ambulatory Visit | Attending: Obstetrics & Gynecology | Admitting: Obstetrics & Gynecology

## 2021-10-06 ENCOUNTER — Encounter: Payer: Self-pay | Admitting: Obstetrics & Gynecology

## 2021-10-06 VITALS — BP 110/68 | HR 71 | Ht 60.5 in | Wt 128.0 lb

## 2021-10-06 DIAGNOSIS — Z01419 Encounter for gynecological examination (general) (routine) without abnormal findings: Secondary | ICD-10-CM | POA: Insufficient documentation

## 2021-10-06 DIAGNOSIS — Z9289 Personal history of other medical treatment: Secondary | ICD-10-CM

## 2021-10-06 DIAGNOSIS — D069 Carcinoma in situ of cervix, unspecified: Secondary | ICD-10-CM | POA: Insufficient documentation

## 2021-10-06 DIAGNOSIS — Z170 Estrogen receptor positive status [ER+]: Secondary | ICD-10-CM

## 2021-10-06 DIAGNOSIS — Z9189 Other specified personal risk factors, not elsewhere classified: Secondary | ICD-10-CM

## 2021-10-06 DIAGNOSIS — C50212 Malignant neoplasm of upper-inner quadrant of left female breast: Secondary | ICD-10-CM

## 2021-10-06 DIAGNOSIS — Z780 Asymptomatic menopausal state: Secondary | ICD-10-CM

## 2021-10-06 DIAGNOSIS — M85852 Other specified disorders of bone density and structure, left thigh: Secondary | ICD-10-CM

## 2021-10-06 NOTE — Progress Notes (Signed)
pers

## 2021-10-06 NOTE — Progress Notes (Addendum)
Erika Ramsey Aug 23, 1945 [id number]   History:    76 y.o. G0 Companion now living in [place].   RP:  Established patient presenting for annual gyn exam   HPI: Postmenopause, well without HRT.  No PMB. No pelvic pain.  Normal vaginal secretions.  Abstinent.  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  Pap reflex today. Urine and bowel movements normal.  [place] 2018.  Dxed with early left Breast Ca, left Lumpectomy in 04/2020.  Started on Anastrazole.  Breasts normal now.  Mammo 03/2021 Neg.  Had bilateral Knee replacements.  Health labs with family physician. BMI 24.59. Very active.  Osteopenia just at the Left Fem Neck T-Score -2.2 on 09-22-2020.    Past medical history,surgical history, family history and social history were all reviewed and documented in the EPIC chart.  Gynecologic History No LMP recorded. Patient is postmenopausal.  Obstetric History OB History  Gravida Para Term Preterm AB Living  0 0 0 0 0 0  SAB IAB Ectopic Multiple Live Births  0 0 0 0 0    ROS: A ROS was performed and pertinent positives and negatives are included in the history. GENERAL: No fevers or chills. HEENT: No change in vision, no earache, sore throat or sinus congestion. NECK: No pain or stiffness. CARDIOVASCULAR: No chest pain or pressure. No palpitations. PULMONARY: No shortness of breath, cough or wheeze. GASTROINTESTINAL: No abdominal pain, nausea, vomiting or diarrhea, melena or bright red blood per rectum. GENITOURINARY: No urinary frequency, urgency, hesitancy or dysuria. MUSCULOSKELETAL: No joint or muscle pain, no back pain, no recent trauma. DERMATOLOGIC: No rash, no itching, no lesions. ENDOCRINE: No polyuria, polydipsia, no heat or cold intolerance. No recent change in weight. HEMATOLOGICAL: No anemia or easy bruising or bleeding. NEUROLOGIC: No headache, seizures, numbness, tingling or weakness. PSYCHIATRIC: No depression, no loss of interest in normal  activity or change in sleep pattern.     Exam:   BP 110/68   Pulse 71   Ht 5' 0.5" (1.537 m)   Wt 128 lb (58.1 kg)   SpO2 98%   BMI 24.59 kg/m   Body mass index is 24.59 kg/m.  General appearance : Well developed well nourished female. No acute distress HEENT: Eyes: no retinal hemorrhage or exudates,  Neck supple, trachea midline, no carotid bruits, no thyroidmegaly Lungs: Clear to auscultation, no rhonchi or wheezes, or rib retractions  Heart: Regular rate and rhythm, no murmurs or gallops Breast:Examined in sitting and supine position were symmetrical in appearance, no palpable masses or tenderness,  no skin retraction, no nipple inversion, no nipple discharge, no skin discoloration, no axillary or supraclavicular lymphadenopathy Abdomen: no palpable masses or tenderness, no rebound or guarding Extremities: no edema or skin discoloration or tenderness  Pelvic: Vulva: Normal             Vagina: No gross lesions or discharge  Cervix: No gross lesions or discharge.  Pap reflex done.  Uterus  AV, normal size, shape and consistency, non-tender and mobile  Adnexa  Without masses or tenderness  Anus: Normal   Assessment/Plan:  76 y.o. female for annual exam   1. Encounter for routine gynecological examination with Papanicolaou smear of cervix Postmenopause, well without HRT.  No PMB. No pelvic pain.  Normal vaginal secretions.  Abstinent.  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  Pap reflex today. Urine and bowel movements normal.  [place] 2018.  Dxed  with early left Breast Ca, left Lumpectomy in 04/2020.  Started on Anastrazole.  Breasts normal now.  Mammo 03/2021 Neg.  Had bilateral Knee replacements.  Health labs with family physician. BMI 24.59. Very active.  Osteopenia just at the Left Fem Neck T-Score -2.2 on 09-22-2020.  - Pap reflex  2. Severe cervical dysplasia, histologically confirmed H/O CIN 3 in the 1980-90's.  Pap reflex done. -  Pap reflex  3. Postmenopause Postmenopause, well without HRT.  No PMB. No pelvic pain.  Normal vaginal secretions.  Abstinent.    4. Osteopenia of neck of left femur Osteopenia just at the Left Fem Neck T-Score -2.2 on 09-22-2020. Other sites normal.  Will repeat a BD in 08/2022. Vit D, Ca++, Weight bearing activities.  5. Malignant neoplasm of upper-inner quadrant of left breast in female, estrogen receptor positive ([place]) On Anastrozole.  6. Personal history of other medical treatment  Other orders - ALPRAZolam (XANAX) 0.25 MG tablet; Take by mouth daily as needed. Takes 1/2 - UNABLE TO FIND; Med Name: histamine   Princess Bruins MD, 2:18 PM 10/06/2021

## 2021-10-06 NOTE — Addendum Note (Signed)
Addended by: Princess Bruins on: 10/06/2021 04:34 PM   Modules accepted: Orders

## 2021-10-07 ENCOUNTER — Encounter: Payer: Self-pay | Admitting: Hematology and Oncology

## 2021-10-08 LAB — CYTOLOGY - PAP: Diagnosis: NEGATIVE

## 2022-04-12 ENCOUNTER — Telehealth: Payer: Self-pay | Admitting: Hematology and Oncology

## 2022-04-12 ENCOUNTER — Other Ambulatory Visit: Payer: Self-pay | Admitting: *Deleted

## 2022-04-12 DIAGNOSIS — Z170 Estrogen receptor positive status [ER+]: Secondary | ICD-10-CM

## 2022-04-12 NOTE — Telephone Encounter (Signed)
Rescheduled appointment per provider PAL. Patient is aware of the changes made to her upcoming appointment. 

## 2022-04-21 ENCOUNTER — Inpatient Hospital Stay: Payer: Medicare PPO | Admitting: Hematology and Oncology

## 2022-04-22 ENCOUNTER — Ambulatory Visit
Admission: RE | Admit: 2022-04-22 | Discharge: 2022-04-22 | Disposition: A | Discharge status: Home / Self Care | Payer: Medicare PPO | Source: Ambulatory Visit | Attending: Hematology and Oncology | Admitting: Hematology and Oncology

## 2022-04-22 DIAGNOSIS — C50212 Malignant neoplasm of upper-inner quadrant of left female breast: Secondary | ICD-10-CM

## 2022-04-25 NOTE — Progress Notes (Signed)
Patient Care Team: [name], MD as PCP - General (Family Medicine) Terrance Mass, MD (Inactive) as Consulting Physician (Gynecology) Laurence Spates, MD (Inactive) as Consulting Physician (Gastroenterology) [name], MD as Consulting Physician (General Surgery) Nicholas Lose, MD as Consulting Physician (Hematology and Oncology) Gery Pray, MD as Consulting Physician (Radiation Oncology)  DIAGNOSIS: No diagnosis found.  SUMMARY OF ONCOLOGIC HISTORY: Oncology History  Malignant neoplasm of upper-inner quadrant of left breast in female, estrogen receptor positive ([place])  05/20/2020 Initial Diagnosis   Screening mammogram showed a possible left breast mass. Diagnostic mammogram and US showed a 0.4cm mass at the 10 o'clock position in the left breast. Biopsy showed IDC, grade 2, HER-2 equivocal by IHC (2+), ER/PR+ 100%, Ki67 15%.    05/27/2020 Cancer Staging   Staging form: Breast, AJCC 8th Edition - Clinical stage from 05/27/2020: Stage IA (cT1a, cN0, cM0, G2, ER+, PR+, HER2-) - Signed by Nicholas Lose, MD on 05/27/2020 Stage prefix: Initial diagnosis Histologic grading system: 3 grade system Laterality: Left Staged by: Pathologist and managing physician Stage used in treatment planning: Yes National guidelines used in treatment planning: Yes Type of national guideline used in treatment planning: NCCN   06/25/2020 Surgery   Left lumpectomy Marlou Starks): IDC, 0.5cm, clear margins.    06/25/2020 Cancer Staging   Staging form: Breast, AJCC 8th Edition - Pathologic stage from 06/25/2020: Stage IA (pT1a, pN0, cM0, G2, ER+, PR+, HER2-) - Signed by Gardenia Phlegm, NP on 10/06/2020 Stage prefix: Initial diagnosis Histologic grading system: 3 grade system   07/03/2020 -  Anti-estrogen oral therapy   Anastrozole, '1mg'$  daily, planned duration 5 years; switched to letrozole 07/13/20 due to difficulty sleeping.     CHIEF COMPLIANT:   INTERVAL HISTORY: Erika Ramsey is  a   ALLERGIES:  is allergic to pollen extract and tomato.  MEDICATIONS:  Current Outpatient Medications  Medication Sig Dispense Refill   ALPRAZolam (XANAX) 0.25 MG tablet Take by mouth daily as needed. Takes 1/2     amLODipine (NORVASC) 5 MG tablet Take 1 tablet (5 mg total) by mouth daily. 30 tablet 0   Calcium Carb-Cholecalciferol (CALCIUM 600 + D) 600-5 MG-MCG TABS Take 1 tablet by mouth daily.     chlorthalidone (HYGROTON) 25 MG tablet Take 12.5 mg by mouth daily.     letrozole (FEMARA) 2.5 MG tablet TAKE ONE TABLET BY MOUTH DAILY 90 tablet 3   losartan (COZAAR) 100 MG tablet Take 100 mg by mouth daily.     magnesium gluconate (MAGONATE) 500 MG tablet Take 500 mg by mouth daily.     Multiple Vitamin (MULTIVITAMIN) tablet Take 1 tablet by mouth daily.     Turmeric 500 MG CAPS Take 500 mg by mouth daily.     UNABLE TO FIND Med Name: histamine     No current facility-administered medications for this visit.    PHYSICAL EXAMINATION: ECOG PERFORMANCE STATUS: {CHL ONC ECOG [phone]}  There were no vitals filed for this visit. There were no vitals filed for this visit.  BREAST:*** No palpable masses or nodules in either right or left breasts. No palpable axillary supraclavicular or infraclavicular adenopathy no breast tenderness or nipple discharge. (exam performed in the presence of a chaperone)  LABORATORY DATA:  I have reviewed the data as listed    Latest Ref Rng & Units 06/22/2020    1:12 PM 05/27/2020   12:36 PM 05/05/2020    3:28 AM  CMP  Glucose 70 - 99 mg/dL 149  96  181   BUN 8 - 23 mg/dL '16  18  18   '$ Creatinine 0.44 - 1.00 mg/dL 0.86  0.83  0.92   Sodium 135 - 145 mmol/L 135  139  137   Potassium 3.5 - 5.1 mmol/L 3.4  3.7  3.4   Chloride 98 - 111 mmol/L 100  100  105   CO2 22 - 32 mmol/L '25  26  23   '$ Calcium 8.9 - 10.3 mg/dL 9.5  9.1  8.2   Total Protein 6.5 - 8.1 g/dL  7.2    Total Bilirubin 0.3 - 1.2 mg/dL  0.4    Alkaline Phos 38 - 126 U/L  99    AST 15 -  41 U/L  18    ALT 0 - 44 U/L  16      Lab Results  Component Value Date   WBC 6.4 05/27/2020   HGB 12.4 05/27/2020   HCT 38.2 05/27/2020   MCV 95.5 05/27/2020   PLT 367 05/27/2020   NEUTROABS 3.1 05/27/2020    ASSESSMENT & PLAN:  No problem-specific Assessment & Plan notes found for this encounter.    No orders of the defined types were placed in this encounter.  The patient has a good understanding of the overall plan. she agrees with it. she will call with any problems that may develop before the next visit here. Total time spent: 30 mins including face to face time and time spent for planning, charting and co-ordination of care   Suzzette Righter, [place] 04/25/22    I Gardiner Coins am acting as a Education administrator for [employer]  ***

## 2022-04-27 ENCOUNTER — Inpatient Hospital Stay: Payer: Medicare PPO | Attending: Hematology and Oncology | Admitting: Hematology and Oncology

## 2022-04-27 ENCOUNTER — Other Ambulatory Visit: Payer: Self-pay

## 2022-04-27 VITALS — BP 156/67 | HR 68 | Temp 97.7°F | Resp 18 | Ht 60.5 in | Wt 133.0 lb

## 2022-04-27 DIAGNOSIS — Z79811 Long term (current) use of aromatase inhibitors: Secondary | ICD-10-CM | POA: Insufficient documentation

## 2022-04-27 DIAGNOSIS — M8580 Other specified disorders of bone density and structure, unspecified site: Secondary | ICD-10-CM | POA: Insufficient documentation

## 2022-04-27 DIAGNOSIS — C50212 Malignant neoplasm of upper-inner quadrant of left female breast: Secondary | ICD-10-CM | POA: Diagnosis not present

## 2022-04-27 DIAGNOSIS — Z170 Estrogen receptor positive status [ER+]: Secondary | ICD-10-CM

## 2022-04-27 DIAGNOSIS — Z79899 Other long term (current) drug therapy: Secondary | ICD-10-CM | POA: Diagnosis not present

## 2022-04-27 MED ORDER — LETROZOLE 2.5 MG PO TABS: 2.5000 mg | ORAL_TABLET | Freq: Every day | ORAL | 3 refills | Status: DC

## 2022-04-27 MED ORDER — CALCIUM CARB-CHOLECALCIFEROL 600-5 MG-MCG PO TABS: 2.0000 | ORAL_TABLET | Freq: Every day | ORAL | Status: DC

## 2022-04-27 MED ORDER — VITAMIN K2 100 MCG PO CAPS: 1.0000 | ORAL_CAPSULE | Freq: Every day | ORAL | Status: DC

## 2022-04-27 NOTE — Assessment & Plan Note (Signed)
05/20/2020:Screening mammogram showed a possible left breast mass. Diagnostic mammogram and US showed a 0.4cm mass at the 10 o'clock position in the left breast. Biopsy showed IDC, grade 2, HER-2 equivocal by IHC (2+), ER/PR+ 100%, Ki67 15%.    Treatment plan: 1.  Breast conserving surgery: 06/25/20: Grade 2 IDC, 0.5 cm, Margins Neg, Er 100%, PR 100%, Her 2 Neg, Ki 67: 15% 2. Based on negative margins and good Prognostic markers, we decided that RT is not necessary 3.  Adjuvant antiestrogen therapy with anastrozole 1 mg daily X 5 years switched to letrozole May 2022 ---------------------------------------------------------------- Current treatment: Anastrozole 1 mg daily started 07/03/2020 switched to letrozole for intolerance Letrozole toxicities: Denies any adverse effects on letrozole therapy. . Bone density 09/22/20: T score -2.2 (osteopenia)   Breast cancer surveillance: 1.  Breast exam 04/27/22: Benign 2. mammogram 04/22/22: Benign breast density category B   Return to clinic in 1 year for follow-up

## 2022-05-10 ENCOUNTER — Encounter: Payer: Self-pay | Admitting: Allergy and Immunology

## 2022-05-10 ENCOUNTER — Other Ambulatory Visit: Payer: Self-pay

## 2022-05-10 ENCOUNTER — Ambulatory Visit: Payer: Medicare PPO | Admitting: Allergy and Immunology

## 2022-05-10 VITALS — BP 142/80 | HR 80 | Temp 98.0°F | Resp 16 | Ht 62.0 in | Wt 132.7 lb

## 2022-05-10 DIAGNOSIS — K219 Gastro-esophageal reflux disease without esophagitis: Secondary | ICD-10-CM | POA: Diagnosis not present

## 2022-05-10 DIAGNOSIS — J4550 Severe persistent asthma, uncomplicated: Secondary | ICD-10-CM | POA: Diagnosis not present

## 2022-05-10 IMAGING — CR DG CHEST 2V
2 series · 2 of 2 positions shown · non-contrast
Comparison: Chest x-ray 07/07/2015.

CLINICAL DATA: Chronic cough.

EXAM:
CHEST - 2 VIEW

[w chest pa]
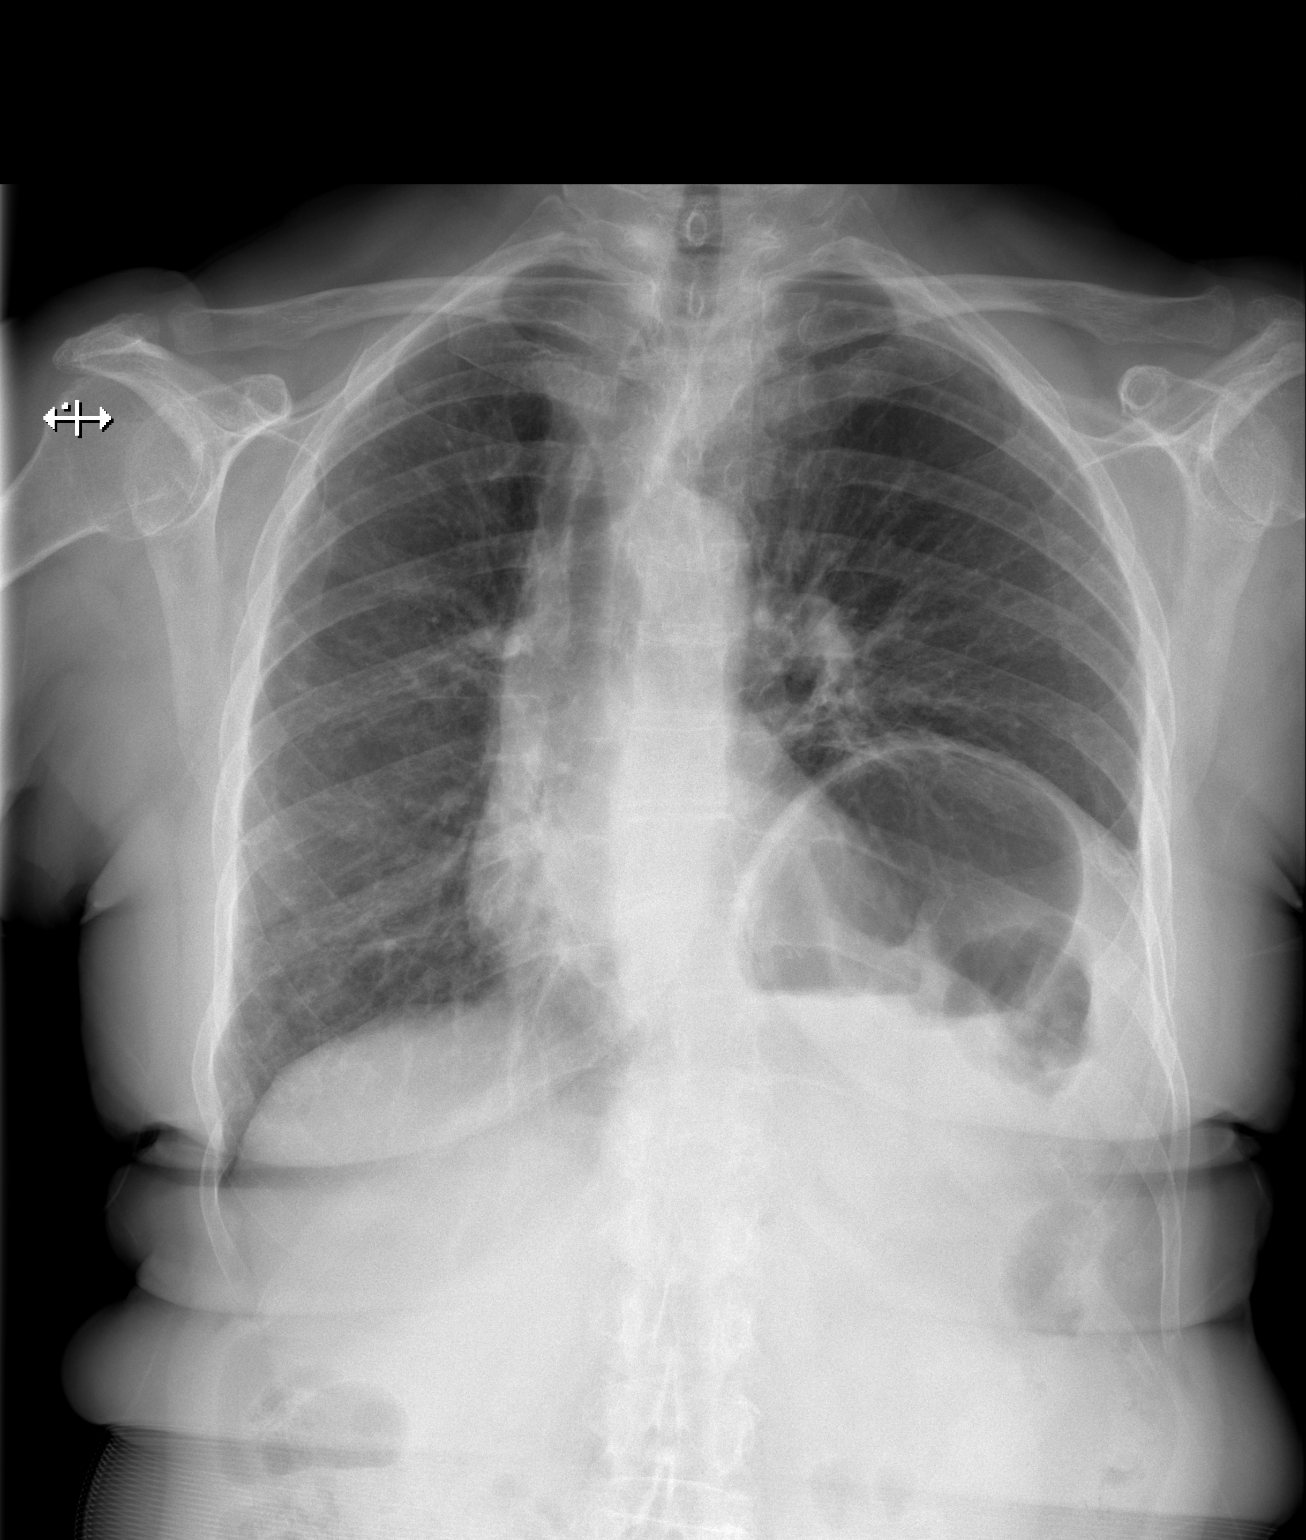

[w chest lat]
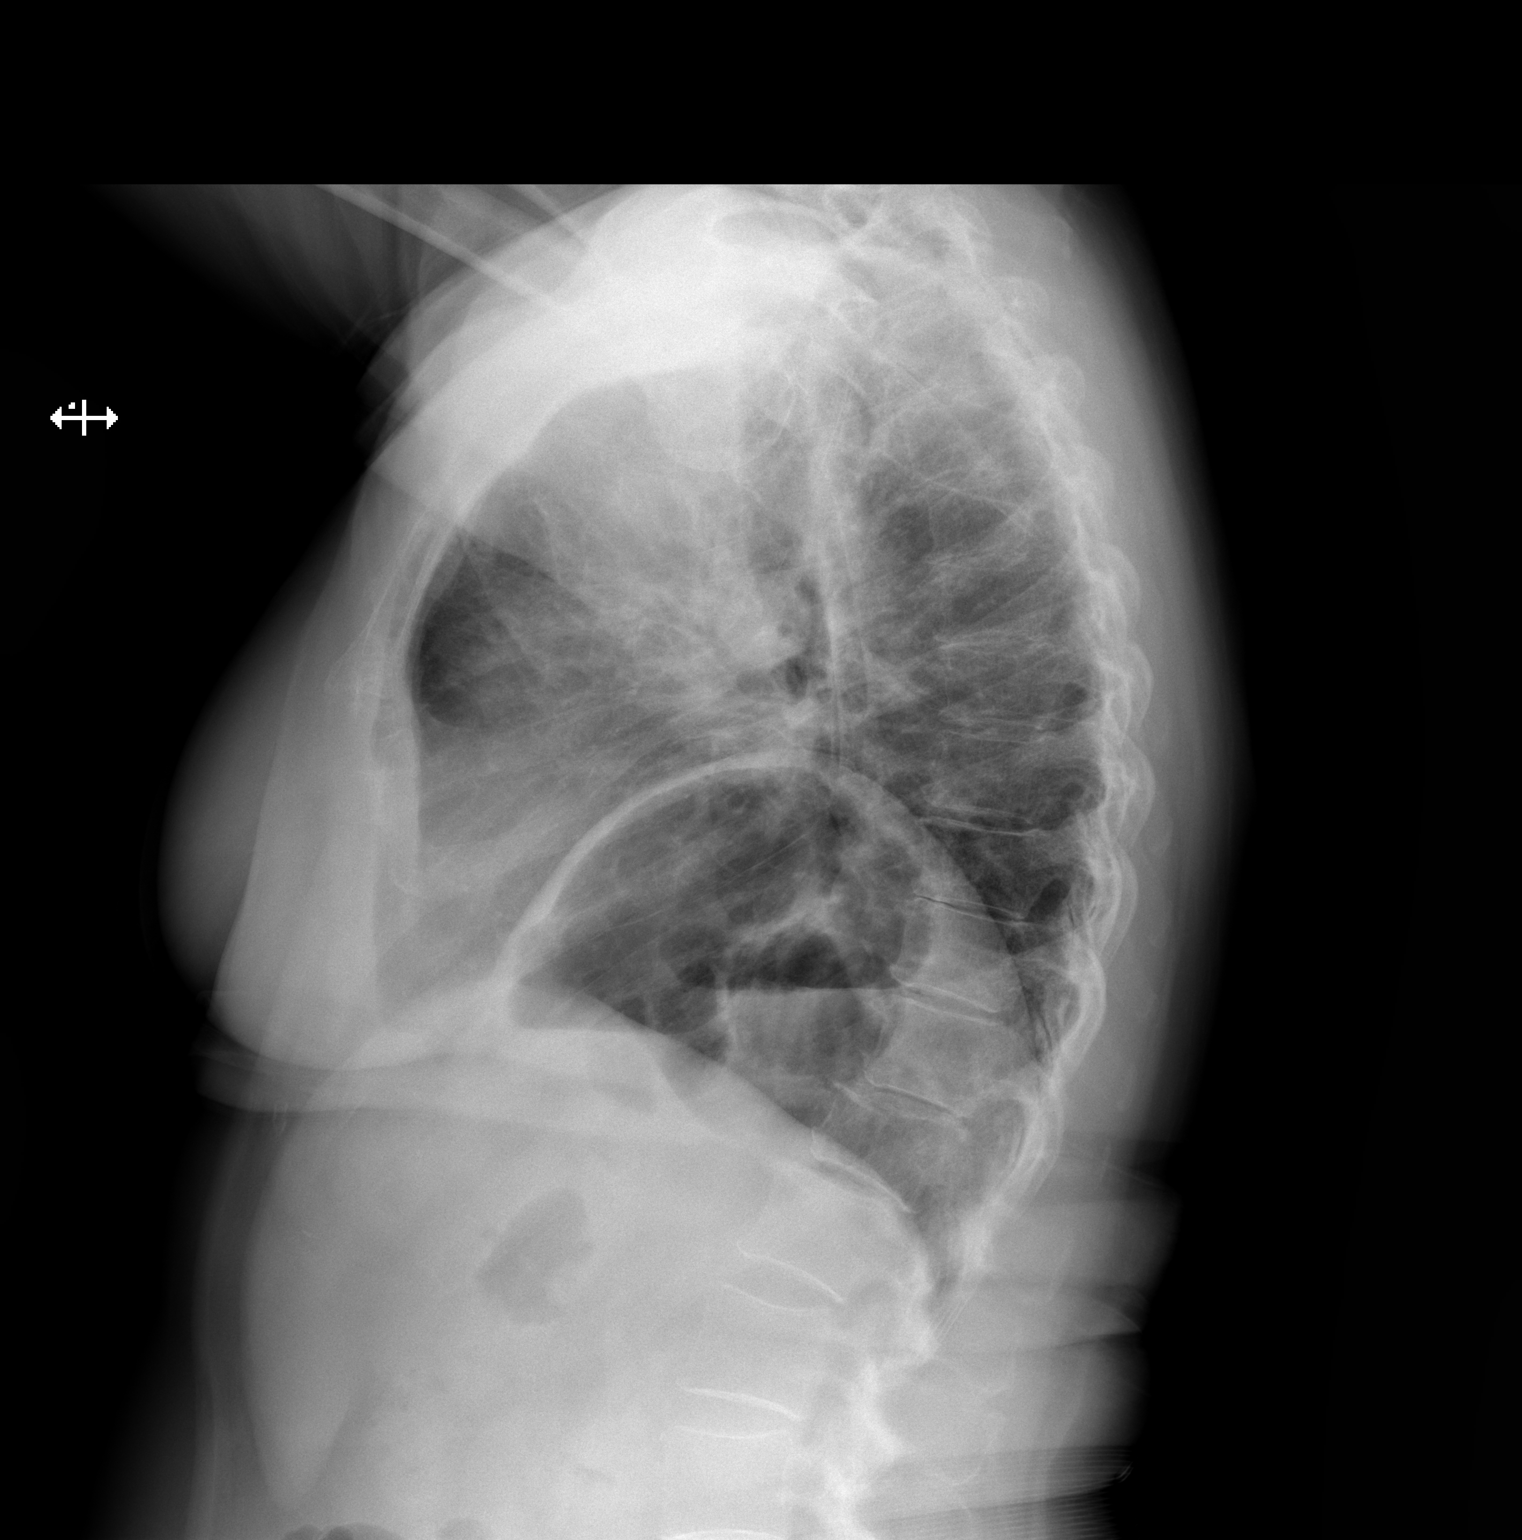

[2 of 2 positions shown; findings below may reference images not displayed]

FINDINGS: There is stable elevation of the left hemidiaphragm. There is no
lung consolidation, pleural effusion or pneumothorax.
Cardiomediastinal silhouette is within normal limits.
IMPRESSION: 1. No acute cardiopulmonary process.
2. Stable elevation of the left hemidiaphragm.

## 2022-05-10 MED ORDER — OMEPRAZOLE 40 MG PO CPDR: 40.0000 mg | DELAYED_RELEASE_CAPSULE | Freq: Every morning | ORAL | 1 refills | Status: DC

## 2022-05-10 MED ORDER — SPACER/AERO-HOLDING CHAMBERS DEVI: 1.0000 | 1 refills | Status: DC

## 2022-05-10 MED ORDER — BREZTRI AEROSPHERE 160-9-4.8 MCG/ACT IN AERO: 2.0000 | INHALATION_SPRAY | Freq: Two times a day (BID) | RESPIRATORY_TRACT | 5 refills | Status: DC

## 2022-05-10 MED ORDER — CETIRIZINE HCL 10 MG PO TABS: 10.0000 mg | ORAL_TABLET | Freq: Every day | ORAL | 5 refills | Status: DC | PRN

## 2022-05-10 MED ORDER — METHYLPREDNISOLONE ACETATE 80 MG/ML IJ SUSP
80.0000 mg | Freq: Once | INTRAMUSCULAR | Status: AC
  Administered 2022-05-10: 80 mg via INTRAMUSCULAR

## 2022-05-10 MED ORDER — FAMOTIDINE 40 MG PO TABS: 40.0000 mg | ORAL_TABLET | Freq: Every evening | ORAL | 1 refills | Status: DC

## 2022-05-10 MED ORDER — ALBUTEROL SULFATE HFA 108 (90 BASE) MCG/ACT IN AERS: 2.0000 | INHALATION_SPRAY | RESPIRATORY_TRACT | 1 refills | Status: DC | PRN

## 2022-05-10 NOTE — Patient Instructions (Signed)
  1. Allergen avoidance measures  2. Treat and prevent inflammation of airway:   A. Depomedrol 80 mg IM delivered in clinic today  B. Breztri - 2 inhalations 2 times per day w/ spacer (empty lungs)  3. Treat and prevent reflex / LPR:   A. Omeprazole 40 mg -1 tablet in AM  B. Famotidine 40 mg - 1 tablet in PM  C. Decrease caffeine and chocolate and alcohol consumption  4. If needed:   A. Albuterol HFA - 2 inhalations every 4-6 hours  B. Cetirizine 10 mg - 1 tablet 1 time per day  5. Obtain a Chest X-Ray  6. Return to clinic in 4 weeks or earlier if problem

## 2022-05-10 NOTE — Progress Notes (Signed)
[place]   Dear Erika Ramsey,  Thank you for referring Erika Ramsey to the [facility] on 05/10/2022.   Below is a summation of this patient's evaluation and recommendations.  Thank you for your referral. I will keep you informed about this patient's response to treatment.   If you have any questions please do not hesitate to contact me.   Sincerely,  [name], MD Allergy / Immunology [place]   ______________________________________________________________________    NEW PATIENT NOTE  Referring Provider: [name], MD Primary Provider: [name], MD Date of office visit: 05/10/2022    Subjective:   Chief Complaint:  Erika Ramsey (DOB: 03-01-1945) is a 77 y.o. female who presents to the clinic on 05/10/2022 with a chief complaint of Allergy Testing .     HPI: Erika Ramsey presents to the clinic in evaluation of cough.  She has a long history of intermittent cough associated with throat clearing and feeling as though the back of her throat is irritated and some intermittent raspy voice.  But in the fall 2023 she developed this dry hacking cough that is been a progressive issue and it became quite significant around Christmas time at which point in time she developed some yellow sputum production.  Currently she has spells of cough and it does disturb her sleep.  She does feel short of breath a bit especially when she exerts herself such as walking or exercises.  She has minimal upper airway symptoms unassociated with any anosmia or ugly nasal discharge.  She does not have any associated posttussive emesis.  She does have burning up into her throat and less than 1 time per week.  She drinks 2 coffees in the morning and has beer on a daily basis and intermittently eats chocolate.  She apparently was deemed to be allergic in  the 1970s by skin testing which identified hypersensitivity against multiple aeroallergens.  She smoke tobacco until 1979 at a rate of under 1 pack/day for 10 years.  Past Medical History:  Diagnosis Date  . Arthritis    hips back, knees.hand  . Atrophic vaginitis   . Breast cyst   . Cancer ([place])    breast  . 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 Decreased libido   . Hypertension   . Neuromuscular disorder (HCC)    tremors  . Osteopenia 08/2017   T score -1.6 FRAX 11% / 2% stable from prior DEXA  . Pneumonia    HX OF   . Shingles     Past Surgical History:  Procedure Laterality Date  . BREAST CYST ASPIRATION    . BREAST LUMPECTOMY Left 2022  . BREAST LUMPECTOMY WITH RADIOACTIVE SEED LOCALIZATION Left 06/25/2020   Procedure: LEFT BREAST LUMPECTOMY WITH RADIOACTIVE SEED LOCALIZATION;  Surgeon: [name], MD;  Location: [facility];  Service: General;  Laterality: Left;  . COLPOSCOPY  1977   CIN 3  . HAND SURGERY    . KNEE SURGERY  2019  . PELVIC LAPAROSCOPY     Diag Lap  . TOTAL HIP ARTHROPLASTY Right 05/04/2016   Procedure: RIGHT TOTAL HIP ARTHROPLASTY ANTERIOR APPROACH;  Surgeon: [name], MD;  Location: [facility];  Service: Orthopedics;  Laterality: Right;  . TOTAL KNEE ARTHROPLASTY Right 01/27/2020   Procedure: TOTAL KNEE ARTHROPLASTY;  Surgeon: [name], MD;  Location: [facility];  Service: Orthopedics;  Laterality: Right;  20mn  . TOTAL KNEE ARTHROPLASTY Left 05/04/2020   Procedure: TOTAL KNEE ARTHROPLASTY;  Surgeon: AGaynelle Arabian MD;  Location: [facility];  Service: Orthopedics;  Laterality: Left;  583m    Allergies as of 05/10/2022       Reactions   Pollen Extract    Tomato    Cheek swells when eating a large amount         Medication List    ALPRAZolam 0.25 MG tablet Commonly known as: XANAX Take by mouth daily as needed. Takes 1/2   amLODipine 5 MG tablet Commonly known as: NORVASC Take 1 tablet (5 mg total) by mouth daily.    Calcium Carb-Cholecalciferol 600-5 MG-MCG Tabs Commonly known as: Calcium 600 + D Take 2 tablets by mouth daily.   chlorthalidone 25 MG tablet Commonly known as: HYGROTON Take 12.5 mg by mouth daily.   letrozole 2.5 MG tablet Commonly known as: FEMARA Take 1 tablet (2.5 mg total) by mouth daily.   losartan 100 MG tablet Commonly known as: COZAAR Take 100 mg by mouth daily.   multivitamin tablet Take 1 tablet by mouth daily.   Turmeric 500 MG Caps Take 500 mg by mouth daily.   UNABLE TO FIND Med Name: histamine   Vitamin K2 100 MCG Caps Take 1 capsule by mouth daily.    Review of systems negative except as noted in HPI / PMHx or noted below:  Review of Systems  Constitutional: Negative.   HENT: Negative.    Eyes: Negative.   Respiratory: Negative.    Cardiovascular: Negative.   Gastrointestinal: Negative.   Genitourinary: Negative.   Musculoskeletal: Negative.   Skin: Negative.   Neurological: Negative.   Endo/Heme/Allergies: Negative.   Psychiatric/Behavioral: Negative.      Family History  Problem Relation Age of Onset  . Hypertension Mother   . Heart disease Mother   . Skin cancer Mother   . Cancer Father        Colon cancer  . Heart disease Father   . Allergic rhinitis Father   . Diabetes Maternal Grandmother   . Cancer Paternal Grandfather        Colon cancer  . Breast cancer Maternal Aunt        Great aunt [id number]'s. Allergic rhinitis Sister   . Stomach cancer Maternal Grandfather   . Angioedema Neg Hx   . Asthma Neg Hx   . Atopy Neg Hx   . Eczema Neg Hx   . Immunodeficiency Neg Hx   . Urticaria Neg Hx     Social History   Socioeconomic History  . Marital status: Divorced    Spouse name: Not on file  . Number of children: Not on file  . Years of education: Not on file  . Highest education level: Not on file  Occupational History  . Not on file  Tobacco Use  . Smoking status: Former    Types: Cigarettes    Quit date: 1979    Years  since quitting: 45.2  . Smokeless tobacco: Never  Vaping Use  . Vaping Use: Never used  Substance and Sexual Activity  . Alcohol use: Yes    Alcohol/week: 1.0 standard drink of alcohol    Types: 1 Cans of beer per week    Comment: 1 BEER DAILY   . Drug use: No  . Sexual activity: Not Currently    Partners: Male    Birth control/protection: Post-menopausal  Comment: intercourse age 44, sexual partners less than 5  Other Topics Concern  . Not on file  Social History Narrative  . Not on file   Environmental and Social history  Lives in a house with a dry environment, a cat located inside the household, no carpet in the bedroom, no plastic on the bed, no plastic on the pillow, no smoking ongoing with inside the household.  Objective:   Vitals:   05/10/22 1007  BP: (!) 142/80  Pulse: 80  Resp: 16  Temp: 98 F (36.7 C)  SpO2: 97%   Height: '5\' 2"'$  (157.5 cm) Weight: 132 lb 11.2 oz (60.2 kg)  Physical Exam Constitutional:      Appearance: She is not diaphoretic.  HENT:     Head: Normocephalic.     Right Ear: Tympanic membrane, ear canal and external ear normal.     Left Ear: Tympanic membrane, ear canal and external ear normal.     Nose: Nose normal. No mucosal edema or rhinorrhea.     Mouth/Throat:     Pharynx: Uvula midline. No oropharyngeal exudate.  Eyes:     Conjunctiva/sclera: Conjunctivae normal.  Neck:     Thyroid: No thyromegaly.     Trachea: Trachea normal. No tracheal tenderness or tracheal deviation.  Cardiovascular:     Rate and Rhythm: Normal rate and regular rhythm.     Heart sounds: Normal heart sounds, S1 normal and S2 normal. No murmur heard. Pulmonary:     Effort: No respiratory distress.     Breath sounds: Normal breath sounds. No stridor. No wheezing or rales.  Lymphadenopathy:     Head:     Right side of head: No tonsillar adenopathy.     Left side of head: No tonsillar adenopathy.     Cervical: No cervical adenopathy.  Skin:    Findings:  No erythema or rash.     Nails: There is no clubbing.  Neurological:     Mental Status: She is alert.    Diagnostics: Allergy skin tests were performed.   Spirometry was performed and demonstrated an FEV1 of 1.31 @ 56 % of predicted. FEV1/FVC = 0.86.  Following administration of nebulized albuterol and ipratropium her FEV1 rose to 1.38 which calculated out to an increase in the FEV1 of 5%.  The patient had an Asthma Control Test with the following results:  .    Results of a chest x-ray obtained 11 May 2021 identifies the following:  There is stable elevation of the left hemidiaphragm. There is no lung consolidation, pleural effusion or pneumothorax. Cardiomediastinal silhouette is within normal limits.  Results of blood tests obtained 27 May 2020 identifies WBC 6.4, absolute eosinophil 300, absolute lymphocyte 2200, hemoglobin 12.4, platelet 367   Assessment and Plan:    1. Not well controlled severe persistent asthma   2. LPRD (laryngopharyngeal reflux disease)     Patient Instructions   1. Allergen avoidance measures  2. Treat and prevent inflammation of airway:   A. Depomedrol 80 mg IM delivered in clinic today  B. Breztri - 2 inhalations 2 times per day w/ spacer (empty lungs)  3. Treat and prevent reflex / LPR:   A. Omeprazole 40 mg -1 tablet in AM  B. Famotidine 40 mg - 1 tablet in PM  C. Decrease caffeine and chocolate and alcohol consumption  4. If needed:   A. Albuterol HFA - 2 inhalations every 4-6 hours  B. Cetirizine 10 mg - 1 tablet 1 time per  day  5. Obtain a Chest X-Ray  6. Return to clinic in 4 weeks or earlier if problem     [name], MD Allergy / Immunology [facility]

## 2022-05-11 ENCOUNTER — Encounter: Payer: Self-pay | Admitting: Allergy and Immunology

## 2022-05-11 ENCOUNTER — Ambulatory Visit
Admission: RE | Admit: 2022-05-11 | Discharge: 2022-05-11 | Disposition: A | Discharge status: Home / Self Care | Payer: Medicare PPO | Source: Ambulatory Visit | Attending: Allergy and Immunology | Admitting: Allergy and Immunology

## 2022-06-07 ENCOUNTER — Encounter: Payer: Self-pay | Admitting: Allergy and Immunology

## 2022-06-07 ENCOUNTER — Ambulatory Visit: Payer: Medicare PPO | Admitting: Allergy and Immunology

## 2022-06-07 VITALS — BP 118/80 | HR 66 | Temp 97.4°F | Resp 16 | Ht 62.0 in | Wt 127.2 lb

## 2022-06-07 DIAGNOSIS — J986 Disorders of diaphragm: Secondary | ICD-10-CM | POA: Diagnosis not present

## 2022-06-07 DIAGNOSIS — K219 Gastro-esophageal reflux disease without esophagitis: Secondary | ICD-10-CM | POA: Diagnosis not present

## 2022-06-07 DIAGNOSIS — J4550 Severe persistent asthma, uncomplicated: Secondary | ICD-10-CM | POA: Diagnosis not present

## 2022-06-07 NOTE — Progress Notes (Signed)
[place]   Follow-up Note  Referring Provider: Dois Davenportichter, Karen L, MD Primary Provider: Dois Davenportichter, Karen L, MD Date of Office Visit: 06/07/2022  Subjective:   Erika CorkMartha B Ramsey (DOB: 07/01/1945) is a [age] y.o. female who [facility]22 in re-evaluation of the following:  HPI: Erika BridgeMartha returns to this clinic in evaluation of cough believed secondary to combination of asthma and LPR.  I last saw her in this clinic 10 May 2022.  She is much better with her cough.  The irritation in her throat is better and intermittent raspy voice is better and she is not having spells of cough and her sleep is not disturbed and overall she feels significant improvement on her current plan.  She has done so well that she stopped her inhaled steroid.  She continues to use omeprazole and famotidine.  She decreased her caffeine about 50%, eliminated chocolate consumption, and decreased her alcohol consumption by about 50%.  Allergies as of 06/07/2022   No Active Allergies      Medication List    albuterol 108 (90 Base) MCG/ACT inhaler Commonly known as: Ventolin HFA Inhale 2 puffs into the lungs every 4 (four) hours as needed for wheezing or shortness of breath.   ALPRAZolam 0.25 MG tablet Commonly known as: XANAX Take by mouth daily as needed. Takes 1/2   amLODipine 5 MG tablet Commonly known as: NORVASC Take 1 tablet (5 mg total) by mouth daily.   Calcium Carb-Cholecalciferol 600-5 MG-MCG Tabs Commonly known as: Calcium 600 + D Take 2 tablets by mouth daily.   chlorthalidone 25 MG tablet Commonly known as: HYGROTON Take 12.5 mg by mouth daily.   famotidine 40 MG tablet Commonly known as: PEPCID Take 1 tablet (40 mg total) by mouth at bedtime.   letrozole 2.5 MG tablet Commonly known as: FEMARA Take 1 tablet (2.5 mg total) by mouth daily.   losartan 100 MG tablet Commonly known as: COZAAR Take 100 mg  by mouth daily.   multivitamin tablet Take 1 tablet by mouth daily.   omeprazole 40 MG capsule Commonly known as: PRILOSEC Take 1 capsule (40 mg total) by mouth in the morning.   Turmeric 500 MG Caps Take 500 mg by mouth daily.   Vitamin K2 100 MCG Caps Take 1 capsule by mouth daily.    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   Decreased libido    Hypertension    Neuromuscular disorder    tremors   Osteopenia 08/2017   T score -1.6 FRAX 11% / 2% stable from prior DEXA   Pneumonia    HX OF    Shingles     Past Surgical History:  Procedure Laterality Date   BREAST CYST ASPIRATION     BREAST LUMPECTOMY Left 2022   BREAST LUMPECTOMY WITH RADIOACTIVE SEED LOCALIZATION Left 06/25/2020   Procedure: LEFT BREAST LUMPECTOMY WITH RADIOACTIVE SEED LOCALIZATION;  Surgeon: Griselda Mineroth, Paul III, MD;  Location: [facility];  Service: General;  Laterality: Left;   COLPOSCOPY  1977   CIN 3   HAND SURGERY     KNEE SURGERY  2019   PELVIC LAPAROSCOPY     Diag Lap   TOTAL HIP ARTHROPLASTY Right 05/04/2016  Procedure: RIGHT TOTAL HIP ARTHROPLASTY ANTERIOR APPROACH;  Surgeon: Ollen Gross, MD;  [facility];  Service: Orthopedics;  Laterality: Right;   TOTAL KNEE ARTHROPLASTY Right 01/27/2020   Procedure: TOTAL KNEE ARTHROPLASTY;  Surgeon: Ollen Gross, MD;  [facility];  Service: Orthopedics;  Laterality: Right;    TOTAL KNEE ARTHROPLASTY Left 05/04/2020   Procedure: TOTAL KNEE ARTHROPLASTY;  Surgeon: Ollen Gross, MD;  [facility];  Service: Orthopedics;  Laterality: Left;     Review of systems negative except as noted in HPI / PMHx or noted below:  Review of Systems  Constitutional: Negative.   HENT: Negative.    Eyes: Negative.   Respiratory: Negative.    Cardiovascular: Negative.   Gastrointestinal: Negative.    Genitourinary: Negative.   Musculoskeletal: Negative.   Skin: Negative.   Neurological: Negative.   Endo/Heme/Allergies: Negative.   Psychiatric/Behavioral: Negative.       Objective:   Vitals:   06/07/22 1512  BP: 118/80  Pulse: 66  Resp: 16  Temp: (!) 97.4 F (36.3 C)  SpO2: 96%   Height: 5\' 2"  (157.5 cm)  Weight: 127 lb 3.2 oz (57.7 kg)   Physical Exam Constitutional:      Appearance: She is not diaphoretic.  HENT:     Head: Normocephalic.     Right Ear: Tympanic membrane, ear canal and external ear normal.     Left Ear: Tympanic membrane, ear canal and external ear normal.     Nose: Nose normal. No mucosal edema or rhinorrhea.     Mouth/Throat:     Pharynx: Uvula midline. No oropharyngeal exudate.  Eyes:     Conjunctiva/sclera: Conjunctivae normal.  Neck:     Thyroid: No thyromegaly.     Trachea: Trachea normal. No tracheal tenderness or tracheal deviation.  Cardiovascular:     Rate and Rhythm: Normal rate and regular rhythm.     Heart sounds: Normal heart sounds, S1 normal and S2 normal. No murmur heard. Pulmonary:     Effort: No respiratory distress.     Breath sounds: Normal breath sounds. No stridor. No wheezing or rales.  Lymphadenopathy:     Head:     Right side of head: No tonsillar adenopathy.     Left side of head: No tonsillar adenopathy.     Cervical: No cervical adenopathy.  Skin:    Findings: No erythema or rash.     Nails: There is no clubbing.  Neurological:     Mental Status: She is alert.     Diagnostics:    Spirometry was performed and demonstrated an FEV1 of 1.36 at 58 % of predicted.  Results of a chest x-ray obtained 11 May 2022 identifies the following:  Unchanged eventration of left hemidiaphragm. Minor adjacent atelectasis or scarring. Heart is normal in size. Stable mediastinal contours. Suspected calcified right mediastinal and hilar lymph nodes. No confluent or acute airspace disease. No evidence of pulmonary mass.  Normal pulmonary vasculature. No pleural effusion or pneumothorax.  Assessment and Plan:   1. Not well controlled severe persistent asthma   2. LPRD (laryngopharyngeal reflux disease)   3. Diaphragm paralysis    1. Allergen avoidance measures - dust mite  2. Treat and prevent inflammation of airway:   A. Breztri - 2 inhalations 1-2 times per day w/ spacer (empty lungs)  3. Treat and prevent reflex / LPR:   A. Omeprazole 40 mg -1 tablet in AM  B. Famotidine 40 mg - 1 tablet in PM  C. Decrease caffeine and chocolate and alcohol consumption  D.  Replace throat clearing with swallowing/drinking maneuver  4. If needed:   A. Albuterol HFA - 2 inhalations every 4-6 hours  B. Cetirizine 10 mg - 1 tablet 1 time per day  5. Return to clinic in 8 weeks or earlier if problem. Taper medications???  Marty's initial response to medical therapy has been quite favorable.  Will continue to have her use a proton pump inhibitor and H2 receptor blocker and minimize caffeine and chocolate and alcohol consumption and replace throat clearing with a swallowing/drinking maneuver.  Unfortunately or fortunately, she will be traveling to [place] this spring and she thinks that she will probably be consuming more caffeine and she will probably be consuming more alcohol during that visit.  I did encourage her to consistently use Breztri at least 1 time per day until she sees me back in this clinic.  In 8 weeks that will be a total of 12 weeks of therapy and will make a decision about further evaluation and treatment based upon her response. 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  Laurette Schimke, MD Allergy / Immunology [facility]

## 2022-06-07 NOTE — Patient Instructions (Addendum)
  1. Allergen avoidance measures - dust mite  2. Treat and prevent inflammation of airway:   A. Breztri - 2 inhalations 1-2 times per day w/ spacer (empty lungs)  3. Treat and prevent reflex / LPR:   A. Omeprazole 40 mg -1 tablet in AM  B. Famotidine 40 mg - 1 tablet in PM  C. Decrease caffeine and chocolate and alcohol consumption  D.  Replace throat clearing with swallowing/drinking maneuver  4. If needed:   A. Albuterol HFA - 2 inhalations every 4-6 hours  B. Cetirizine 10 mg - 1 tablet 1 time per day  5. Return to clinic in 8 weeks or earlier if problem. Taper medications???

## 2022-06-13 ENCOUNTER — Encounter: Payer: Self-pay | Admitting: Allergy and Immunology

## 2022-08-09 ENCOUNTER — Ambulatory Visit: Payer: Medicare PPO | Admitting: Allergy and Immunology

## 2022-08-30 ENCOUNTER — Other Ambulatory Visit: Payer: Self-pay

## 2022-08-30 ENCOUNTER — Ambulatory Visit: Payer: Medicare PPO | Admitting: Allergy and Immunology

## 2022-08-30 ENCOUNTER — Encounter: Payer: Self-pay | Admitting: Allergy and Immunology

## 2022-08-30 VITALS — BP 120/70 | HR 71 | Temp 97.2°F | Ht 62.0 in | Wt 125.8 lb

## 2022-08-30 DIAGNOSIS — J4550 Severe persistent asthma, uncomplicated: Secondary | ICD-10-CM | POA: Diagnosis not present

## 2022-08-30 DIAGNOSIS — K219 Gastro-esophageal reflux disease without esophagitis: Secondary | ICD-10-CM

## 2022-08-30 DIAGNOSIS — J986 Disorders of diaphragm: Secondary | ICD-10-CM | POA: Diagnosis not present

## 2022-08-30 MED ORDER — ALBUTEROL SULFATE HFA 108 (90 BASE) MCG/ACT IN AERS: 2.0000 | INHALATION_SPRAY | RESPIRATORY_TRACT | 1 refills | Status: DC | PRN

## 2022-08-30 MED ORDER — OMEPRAZOLE 40 MG PO CPDR: 40.0000 mg | DELAYED_RELEASE_CAPSULE | Freq: Every morning | ORAL | 1 refills | Status: DC

## 2022-08-30 MED ORDER — BREZTRI AEROSPHERE 160-9-4.8 MCG/ACT IN AERO: 2.0000 | INHALATION_SPRAY | Freq: Two times a day (BID) | RESPIRATORY_TRACT | 1 refills | Status: DC

## 2022-08-30 MED ORDER — FAMOTIDINE 40 MG PO TABS: 40.0000 mg | ORAL_TABLET | Freq: Every evening | ORAL | 1 refills | Status: DC

## 2022-08-30 MED ORDER — SPACER/AERO-HOLDING CHAMBERS DEVI: 1.0000 | 1 refills | Status: AC

## 2022-08-30 NOTE — Progress Notes (Unsigned)
[place] [facility] [place] [facility] [place] [facility] [place] [facility] [name]   Follow-up Note  Referring Provider: Dois Davenport, MD Primary Provider: Dois Davenport, MD Date of Office Visit: 08/30/2022  Subjective:   Erika Ramsey (DOB: 07/18/1945) is a 77 y.o. female who [facility] on 08/30/2022 in re-evaluation of the following:  HPI: Erika Ramsey returns to this clinic in reevaluation of asthma and LPR and left diaphragm paralysis.  I last saw her in this clinic 07 June 2022.  When I last saw her in this clinic she tapered off her Breo history and I encouraged her to use at least 1 time per day but she did not do so and then she had recrudescence of her unrelenting cough and she restarted her Markus Daft and that has since stopped.  She has no problems with her throat at this point in time.  She has no problems with reflux.  Allergies as of 08/30/2022   No Active Allergies      Medication List    albuterol 108 (90 Base) MCG/ACT inhaler Commonly known as: Ventolin HFA Inhale 2 puffs into the lungs every 4 (four) hours as needed for wheezing or shortness of breath.   ALPRAZolam 0.25 MG tablet Commonly known as: XANAX Take by mouth daily as needed. Takes 1/2   amLODipine 5 MG tablet Commonly known as: NORVASC Take 1 tablet (5 mg total) by mouth daily.   Breztri Aerosphere 160-9-4.8 MCG/ACT Aero Generic drug: Budeson-Glycopyrrol-Formoterol SMARTSIG:2 Puff(s) Via Inhaler Morning-Night   Calcium Carb-Cholecalciferol 600-5 MG-MCG Tabs Commonly known as: Calcium 600 + D Take 2 tablets by mouth daily.   chlorthalidone 25 MG tablet Commonly known as: HYGROTON Take 12.5 mg by mouth daily.   famotidine 40 MG tablet Commonly known as: PEPCID Take 1 tablet (40 mg total) by mouth at bedtime.   letrozole 2.5 MG tablet Commonly known as: FEMARA Take 1 tablet (2.5 mg total) by mouth daily.   losartan 100 MG tablet Commonly known as: COZAAR Take 100 mg by mouth  daily.   multivitamin tablet Take 1 tablet by mouth daily.   omeprazole 40 MG capsule Commonly known as: PRILOSEC Take 1 capsule (40 mg total) by mouth in the morning.    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   Decreased libido    Hypertension    Neuromuscular disorder (HCC)    tremors   Osteopenia 08/2017   T score -1.6 FRAX 11% / 2% stable from prior DEXA   Pneumonia    HX OF    Shingles     Past Surgical History:  Procedure Laterality Date   BREAST CYST ASPIRATION     BREAST LUMPECTOMY Left 2022   BREAST LUMPECTOMY WITH RADIOACTIVE SEED LOCALIZATION Left 06/25/2020   Procedure: LEFT BREAST LUMPECTOMY WITH RADIOACTIVE SEED LOCALIZATION;  Surgeon: Griselda Miner, MD;  Location: [facility];  Service: General;  Laterality: Left;   COLPOSCOPY  1977   CIN 3   HAND SURGERY     KNEE SURGERY  2019   PELVIC LAPAROSCOPY     Diag Lap   TOTAL HIP ARTHROPLASTY Right 05/04/2016   Procedure: RIGHT TOTAL HIP ARTHROPLASTY ANTERIOR APPROACH;  Surgeon: Ollen Gross, MD;  [facility];  Service: Orthopedics;  Laterality: Right;   TOTAL KNEE ARTHROPLASTY Right 01/27/2020   Procedure: TOTAL KNEE ARTHROPLASTY;  Surgeon: Ollen Gross, MD;  Location[facility];  Service: Orthopedics;  Laterality: Right;    TOTAL KNEE ARTHROPLASTY Left 05/04/2020   Procedure: TOTAL KNEE ARTHROPLASTY;  Surgeon: Ollen Gross, MD;  [facility];  Service: Orthopedics;  Laterality: Left;     Review of systems negative except as noted in HPI / PMHx or noted below:  Review of Systems  Constitutional: Negative.   HENT: Negative.    Eyes: Negative.   Respiratory: Negative.    Cardiovascular: Negative.   Gastrointestinal: Negative.   Genitourinary: Negative.   Musculoskeletal: Negative.   Skin: Negative.   Neurological: Negative.    Endo/Heme/Allergies: Negative.   Psychiatric/Behavioral: Negative.       Objective:   Vitals:   08/30/22 1457  BP: 120/70  Pulse: 71  Temp: (!) 97.2 F (36.2 C)  SpO2: 97%   Height: 5\' 2"  (157.5 cm)  Weight: 125 lb 12.8 oz (57.1 kg)   Physical Exam Constitutional:      Appearance: She is not diaphoretic.  HENT:     Head: Normocephalic.     Right Ear: Tympanic membrane, ear canal and external ear normal.     Left Ear: Tympanic membrane, ear canal and external ear normal.     Nose: Nose normal. No mucosal edema or rhinorrhea.     Mouth/Throat:     Pharynx: Uvula midline. No oropharyngeal exudate.  Eyes:     Conjunctiva/sclera: Conjunctivae normal.  Neck:     Thyroid: No thyromegaly.     Trachea: Trachea normal. No tracheal tenderness or tracheal deviation.  Cardiovascular:     Rate and Rhythm: Normal rate and regular rhythm.     Heart sounds: Normal heart sounds, S1 normal and S2 normal. No murmur heard. Pulmonary:     Effort: No respiratory distress.     Breath sounds: Normal breath sounds. No stridor. No wheezing or rales.  Lymphadenopathy:     Head:     Right side of head: No tonsillar adenopathy.     Left side of head: No tonsillar adenopathy.     Cervical: No cervical adenopathy.  Skin:    Findings: No erythema or rash.     Nails: There is no clubbing.  Neurological:     Mental Status: She is alert.     Diagnostics:    Spirometry was performed and demonstrated an FEV1 of 1.60 at 86 % of predicted.  Assessment and Plan:   1. Asthma, severe persistent, well-controlled   2. LPRD (laryngopharyngeal reflux disease)   3. Diaphragm paralysis    1. Allergen avoidance measures - dust mite  2. Treat and prevent inflammation of airway:   A. Breztri - 2 inhalations 1-2 times per day w/ spacer (empty lungs)  3. Treat and prevent reflex / LPR:   A. Omeprazole 40 mg -1 tablet in AM  B. Famotidine 40 mg - 1 tablet in PM if needed  C. Decrease caffeine and  chocolate and alcohol consumption  D.  Replace throat clearing with swallowing/drinking maneuver  4. If needed:   A. Albuterol HFA - 2 inhalations every 4-6 hours  B. Cetirizine 10 mg - 1 tablet 1 time per day  5. Return to clinic in December 2024 or earlier if problem.    6. Plan for fall flu vaccine  Erika Ramsey is doing pretty well and she is itching to decrease her medications is much  as possible.  Will let her see if she does okay with Breztri once a day and if she does okay using that lower dose then she can attempt to discontinue her famotidine and will use a collection of respiratory once a day and omeprazole once a day until she returns to this clinic in December 2024 and there may be an opportunity to further consolidate her treatment without visit.  Laurette Schimke, MD Allergy / Immunology [facility]

## 2022-08-30 NOTE — Patient Instructions (Addendum)
  1. Allergen avoidance measures - dust mite  2. Treat and prevent inflammation of airway:   A. Breztri - 2 inhalations 1-2 times per day w/ spacer (empty lungs)  3. Treat and prevent reflex / LPR:   A. Omeprazole 40 mg -1 tablet in AM  B. Famotidine 40 mg - 1 tablet in PM if needed  C. Decrease caffeine and chocolate and alcohol consumption  D.  Replace throat clearing with swallowing/drinking maneuver  4. If needed:   A. Albuterol HFA - 2 inhalations every 4-6 hours  B. Cetirizine 10 mg - 1 tablet 1 time per day  5. Return to clinic in December 2024 or earlier if problem.    6. Plan for fall flu vaccine

## 2022-08-31 ENCOUNTER — Encounter: Payer: Self-pay | Admitting: Allergy and Immunology

## 2022-10-28 ENCOUNTER — Encounter: Payer: Medicare PPO | Admitting: Obstetrics and Gynecology

## 2022-11-03 ENCOUNTER — Ambulatory Visit (INDEPENDENT_AMBULATORY_CARE_PROVIDER_SITE_OTHER): Payer: Medicare PPO | Admitting: Obstetrics and Gynecology

## 2022-11-03 ENCOUNTER — Other Ambulatory Visit (HOSPITAL_COMMUNITY)
Admission: RE | Admit: 2022-11-03 | Discharge: 2022-11-03 | Disposition: A | Discharge status: Home / Self Care | Payer: Medicare PPO | Source: Ambulatory Visit | Attending: Obstetrics and Gynecology | Admitting: Obstetrics and Gynecology

## 2022-11-03 ENCOUNTER — Encounter: Payer: Self-pay | Admitting: Obstetrics and Gynecology

## 2022-11-03 VITALS — BP 114/72 | HR 63 | Resp 16 | Ht 60.5 in | Wt 126.0 lb

## 2022-11-03 DIAGNOSIS — Z9289 Personal history of other medical treatment: Secondary | ICD-10-CM

## 2022-11-03 DIAGNOSIS — Z0000 Encounter for general adult medical examination without abnormal findings: Secondary | ICD-10-CM

## 2022-11-03 DIAGNOSIS — Z9189 Other specified personal risk factors, not elsewhere classified: Secondary | ICD-10-CM

## 2022-11-03 DIAGNOSIS — Z853 Personal history of malignant neoplasm of breast: Secondary | ICD-10-CM | POA: Diagnosis not present

## 2022-11-03 DIAGNOSIS — D069 Carcinoma in situ of cervix, unspecified: Secondary | ICD-10-CM | POA: Diagnosis present

## 2022-11-03 DIAGNOSIS — C50011 Malignant neoplasm of nipple and areola, right female breast: Secondary | ICD-10-CM | POA: Diagnosis not present

## 2022-11-03 DIAGNOSIS — Z170 Estrogen receptor positive status [ER+]: Secondary | ICD-10-CM

## 2022-11-03 DIAGNOSIS — M8580 Other specified disorders of bone density and structure, unspecified site: Secondary | ICD-10-CM | POA: Diagnosis not present

## 2022-11-03 NOTE — Progress Notes (Signed)
77 y.o. y.o. female here for annual medicare exam. She denies any PM bleeding.   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 Repeat pap smears negative. 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 rate 16, height 5' 0.5" (1.537 m), weight 126 lb (57.2 kg).     Component Value Date/Time   DIAGPAP  10/06/2021 1633    - Negative for intraepithelial lesion or malignancy (NILM)   ADEQPAP  10/06/2021 1633    Satisfactory for evaluation. The presence or absence of an   ADEQPAP  10/06/2021 1633    endocervical/transformation zone component cannot be determined because   ADEQPAP of atrophy. 10/06/2021 1633    GYN HISTORY:    Component Value Date/Time   DIAGPAP  10/06/2021 1633    - Negative for intraepithelial lesion or malignancy (NILM)   ADEQPAP  10/06/2021 1633    Satisfactory for evaluation. The presence or absence of an   ADEQPAP  10/06/2021 1633    endocervical/transformation zone component cannot be determined because   ADEQPAP of atrophy. 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   Decreased libido    Hypertension    Laryngopharyngeal reflux (LPR)    Neuromuscular disorder (HCC)    tremors   Osteopenia 08/2017   T score -1.6 FRAX 11% / 2% stable from prior DEXA   Pneumonia    HX OF    Shingles     Past Surgical History:  Procedure Laterality Date   BREAST CYST ASPIRATION     BREAST LUMPECTOMY Left 2022   BREAST LUMPECTOMY WITH RADIOACTIVE SEED LOCALIZATION Left 06/25/2020    Procedure: LEFT BREAST LUMPECTOMY WITH RADIOACTIVE SEED LOCALIZATION;  Surgeon: Griselda Miner, MD;  Location: [facility];  Service: General;  Laterality: Left;   COLPOSCOPY  1977   CIN 3   HAND SURGERY     KNEE SURGERY  2019   PELVIC LAPAROSCOPY     Diag Lap   TOTAL HIP ARTHROPLASTY Right 05/04/2016   Procedure: RIGHT TOTAL HIP ARTHROPLASTY ANTERIOR APPROACH;  Surgeon: Ollen Gross, MD;  Location: [facility];  Service: Orthopedics;  Laterality: Right;   TOTAL KNEE ARTHROPLASTY Right 01/27/2020   Procedure: TOTAL KNEE ARTHROPLASTY;  Surgeon: Ollen Gross, MD;  Location: [facility];  Service: Orthopedics;  Laterality: Right;    TOTAL KNEE ARTHROPLASTY Left 05/04/2020   Procedure: TOTAL KNEE ARTHROPLASTY;  Surgeon: Ollen Gross, MD;  Location: [facility];  Service: Orthopedics;  Laterality: Left;     Current Outpatient Medications on File Prior to Visit  Medication Sig Dispense Refill   albuterol (VENTOLIN HFA) 108 (90 Base) MCG/ACT inhaler Inhale 2 puffs into the lungs every 4 (four) hours as needed for wheezing or shortness of breath. 18 g 1   ALPRAZolam (XANAX) 0.25 MG tablet Take by mouth daily as needed. Takes  1/2     amLODipine (NORVASC) 5 MG tablet Take 1 tablet (5 mg total) by mouth daily. (Patient taking differently: Take by mouth daily. Takes 1 & 1/2) 30 tablet 0   BREZTRI AEROSPHERE 160-9-4.8 MCG/ACT AERO Inhale 2 puffs into the lungs in the morning and at bedtime. With spacer 32.1 g 1   CALCIUM PO Take by mouth.     chlorthalidone (HYGROTON) 25 MG tablet Take 12.5 mg by mouth daily.     famotidine (PEPCID) 40 MG tablet Take 1 tablet (40 mg total) by mouth at bedtime. 90 tablet 1   letrozole (FEMARA) 2.5 MG tablet Take 1 tablet (2.5 mg total) by mouth daily. 90 tablet 3   losartan (COZAAR) 100 MG tablet Take 100 mg by mouth daily.     Multiple Vitamin (MULTIVITAMIN) tablet Take 1 tablet by mouth daily.     Omega-3 Fatty Acids (FISH OIL) 500 MG CAPS       omeprazole (PRILOSEC) 40 MG capsule Take 1 capsule (40 mg total) by mouth in the morning. 90 capsule 1   Red Yeast Rice 600 MG TABS      Spacer/Aero-Holding Chambers DEVI 1 Device by Does not apply route as directed. 1 each 1   No current facility-administered medications on file prior to visit.    Social History   Socioeconomic History   Marital status: Divorced    Spouse name: Not on file   Number of children: Not on file   Years of education: Not on file   Highest education level: Not on file  Occupational History   Not on file  Tobacco Use   Smoking status: Former    Current packs/day: 0.00    Types: Cigarettes    Quit date: 85    Years since quitting: 45.7   Smokeless tobacco: Never  Vaping Use   Vaping status: Never Used  Substance and Sexual Activity   Alcohol use: Yes    Comment: occ   Drug use: No   Sexual activity: Not Currently    Partners: Male    Birth control/protection: Post-menopausal    Comment: intercourse age 89, sexual partners less than 5  Other Topics Concern   Not on file  Social History Narrative   Not on file   Social Determinants of Health   Financial Resource Strain: Not on file  Food Insecurity: Not on file  Transportation Needs: Not on file  Physical Activity: Not on file  Stress: Not on file  Social Connections: Not on file  Intimate Partner Violence: Not on file    Family History  Problem Relation Age of Onset   Hypertension Mother    Heart disease Mother    Skin cancer Mother    Cancer Father        Colon cancer   Heart disease Father    Allergic rhinitis Father    Allergic rhinitis Sister    Breast cancer Maternal Aunt        Great aunt 6's   Diabetes Maternal Grandmother    Stomach cancer Maternal Grandfather    Cancer Paternal Grandfather        Colon cancer   Immunodeficiency Neg Hx    Urticaria Neg Hx      No Active Allergies    Review of Systems Alls systems reviewed and are negative.      PE General appearance: alert, cooperative and appears stated age Head: Normocephalic, without obvious abnormality, atraumatic Neck: no adenopathy, supple, symmetrical, trachea midline  and thyroid normal to inspection and palpation Lungs: clear to auscultation bilaterally Breasts: normal appearance, no masses or tenderness Heart: regular rate and rhythm Abdomen: soft, non-tender; bowel sounds normal; no masses,  no organomegaly Extremities: extremities normal, atraumatic, no cyanosis or edema Skin: Skin color, texture, turgor normal. No rashes or lesions Lymph nodes: Cervical, supraclavicular, and axillary nodes normal. No abnormal inguinal nodes palpated Neurologic: Grossly normal     Pelvic: External genitalia:  no lesions              Urethra:  normal appearing urethra with no masses, tenderness or lesions              Bartholins and Skenes: normal                 Vagina: atrophic appearing vagina with normal color and discharge, no lesions.               Cervix: no lesions, no cervical motion tenderness               Bimanual Exam:  Uterus:  normal size, contour, position, consistency, mobility, non-tender              Adnexa: no mass, fullness, tenderness          Chaperone was present for exam.    1. Encounter for routine gynecological examination with Papanicolaou smear of cervix Postmenopause, well without HRT.  No PMB. No pelvic pain.  Normal vaginal secretions.  Abstinent.  H/O CIN 3 Cryotherapy in 1980-90's, paps normal since then, last Pap Neg.  Pap reflex today. Urine and bowel movements normal.  Colono 2018.  Dxed with early left Breast Ca, left Lumpectomy in 04/2020.  Started on Anastrazole.  Breasts normal now.  Mammo 03/2022 Neg cat 2.  Had bilateral Knee replacements.  Health labs with family physician. BMI 24.59. Very active.  Osteopenia just at the Left Fem Neck T-Score -2.2 on 09-22-2020.  - Pap reflex                           P:        Pap smear collected              Encouraged annual mammogram screening             Colonoscopy 2018 and she states she will verify if she needs to go this year.  Her father had colon cancer Offered tv US to evaluate the endometrial lining on letrozole.  Discussed small associated risk for endometrial cancer with the medication.             Labs and immunizations with her primary             Continue calcium.  Vit D level was too high and she has stopped this.  Two year repeat bone scan ordered.             Encouraged safe sexual practices and enouraged healthy lifestyle practices with diet and exercise  Earley Favor

## 2022-11-18 LAB — CYTOLOGY - PAP: Diagnosis: NEGATIVE

## 2023-01-31 ENCOUNTER — Ambulatory Visit: Payer: Medicare PPO | Admitting: Allergy and Immunology

## 2023-01-31 VITALS — BP 136/68 | HR 68 | Temp 97.9°F | Resp 14 | Ht 61.02 in | Wt 125.4 lb

## 2023-01-31 DIAGNOSIS — J986 Disorders of diaphragm: Secondary | ICD-10-CM

## 2023-01-31 DIAGNOSIS — J4550 Severe persistent asthma, uncomplicated: Secondary | ICD-10-CM

## 2023-01-31 DIAGNOSIS — K219 Gastro-esophageal reflux disease without esophagitis: Secondary | ICD-10-CM | POA: Diagnosis not present

## 2023-01-31 MED ORDER — CETIRIZINE HCL 10 MG PO TABS: 10.0000 mg | ORAL_TABLET | Freq: Every day | ORAL | 1 refills | Status: DC | PRN

## 2023-01-31 MED ORDER — ALBUTEROL SULFATE HFA 108 (90 BASE) MCG/ACT IN AERS: 2.0000 | INHALATION_SPRAY | RESPIRATORY_TRACT | 1 refills | Status: DC | PRN

## 2023-01-31 MED ORDER — OMEPRAZOLE 40 MG PO CPDR: 40.0000 mg | DELAYED_RELEASE_CAPSULE | Freq: Every morning | ORAL | 1 refills | Status: DC

## 2023-01-31 MED ORDER — MOMETASONE FURO-FORMOTEROL FUM 200-5 MCG/ACT IN AERO: 2.0000 | INHALATION_SPRAY | Freq: Two times a day (BID) | RESPIRATORY_TRACT | 1 refills | Status: DC

## 2023-01-31 MED ORDER — FAMOTIDINE 40 MG PO TABS: 40.0000 mg | ORAL_TABLET | Freq: Every evening | ORAL | 1 refills | Status: DC

## 2023-01-31 MED ORDER — SPACER/AERO-HOLDING CHAMBERS DEVI: 1.0000 | 1 refills | Status: AC

## 2023-01-31 NOTE — Progress Notes (Unsigned)
[facility] [place] [facility] [place] [facility] [place] [facility] [name]   Follow-up Note  Referring Provider: Dois Davenport, MD Primary Provider: Dois Davenport, MD Date of Office Visit: 01/31/2023  Subjective:   Erika Ramsey (DOB: Jan 03, 1946) is a 77 y.o. female who [facility] in re-evaluation of the following:  HPI: Erika Ramsey turns to this clinic in evaluation of asthma, LPR, left diaphragmatic paralysis.  I last saw her in this clinic 30 August 2022.   Although she does better regarding her cough in regard to elimination of any nighttime cough that disturbs her sleep she still does have a chronic cough especially when she wakes up in the morning.  This occurs while using her Breztri 1 time per day and this occurs while using her omeprazole 1 time per day.  She is not using her Breztri twice a day and she is not using her famotidine at nighttime.  She is very good about eliminating caffeine and chocolate consumption and rarely has any alcohol and she is cognizant about replacing throat clearing with a swallowing and drinking maneuver.  She questions whether or not Markus Daft is actually giving her any benefit or is it actually providing some type of side effect irritating her airway.  Allergies as of 01/31/2023   No Active Allergies      Medication List    albuterol 108 (90 Base) MCG/ACT inhaler Commonly known as: Ventolin HFA Inhale 2 puffs into the lungs every 4 (four) hours as needed for wheezing or shortness of breath.   ALPRAZolam 0.25 MG tablet Commonly known as: XANAX Take by mouth daily as needed. Takes 1/2   amLODipine 5 MG tablet Commonly known as: NORVASC Take 1 tablet (5 mg total) by mouth daily.   Breztri Aerosphere 160-9-4.8 MCG/ACT Aero Generic drug: Budeson-Glycopyrrol-Formoterol Inhale 2 puffs into the lungs in the morning and at bedtime. With spacer   CALCIUM PO Take by mouth.   chlorthalidone 25 MG tablet Commonly known  as: HYGROTON Take 12.5 mg by mouth daily.   Fish Oil 500 MG Caps   letrozole 2.5 MG tablet Commonly known as: FEMARA Take 1 tablet (2.5 mg total) by mouth daily.   losartan 100 MG tablet Commonly known as: COZAAR Take 100 mg by mouth daily.   multivitamin tablet Take 1 tablet by mouth daily.   omeprazole 40 MG capsule Commonly known as: PRILOSEC Take 1 capsule (40 mg total) by mouth in the morning.   Red Yeast Rice 600 MG Tabs   Spacer/Aero-Holding Chambers Devi 1 Device by Does not apply route as directed.    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   Decreased libido    Hypertension    Laryngopharyngeal reflux (LPR)    Neuromuscular disorder (HCC)    tremors   Osteopenia 08/2017   T score -1.6 FRAX 11% / 2% stable from prior DEXA   Pneumonia    HX OF    Shingles     Past Surgical History:  Procedure Laterality Date   BREAST CYST ASPIRATION     BREAST LUMPECTOMY Left 2022   BREAST LUMPECTOMY WITH RADIOACTIVE SEED LOCALIZATION Left 06/25/2020   Procedure: LEFT BREAST LUMPECTOMY WITH RADIOACTIVE SEED LOCALIZATION;  Surgeon: Chevis Pretty  III, MD;  Location: [facility];  Service: General;  Laterality: Left;   COLPOSCOPY  1977   CIN 3   HAND SURGERY     KNEE SURGERY  2019   PELVIC LAPAROSCOPY     Diag Lap   TOTAL HIP ARTHROPLASTY Right 05/04/2016   Procedure: RIGHT TOTAL HIP ARTHROPLASTY ANTERIOR APPROACH;  Surgeon: Ollen Gross, MD;  Location: [facility];  Service: Orthopedics;  Laterality: Right;   TOTAL KNEE ARTHROPLASTY Right 01/27/2020   Procedure: TOTAL KNEE ARTHROPLASTY;  Surgeon: Ollen Gross, MD;  Location: [facility];  Service: Orthopedics;  Laterality: Right;    TOTAL KNEE ARTHROPLASTY Left 05/04/2020   Procedure: TOTAL KNEE ARTHROPLASTY;  Surgeon: Ollen Gross, MD;  Location: [facility];   Service: Orthopedics;  Laterality: Left;     Review of systems negative except as noted in HPI / PMHx or noted below:  Review of Systems  Constitutional: Negative.   HENT: Negative.    Eyes: Negative.   Respiratory: Negative.    Cardiovascular: Negative.   Gastrointestinal: Negative.   Genitourinary: Negative.   Musculoskeletal: Negative.   Skin: Negative.   Neurological: Negative.   Endo/Heme/Allergies: Negative.   Psychiatric/Behavioral: Negative.       Objective:   Vitals:   01/31/23 1340  BP: 136/68  Pulse: 68  Resp: 14  Temp: 97.9 F (36.6 C)  SpO2: 95%   Height: 5' 1.02" (155 cm)  Weight: 125 lb 6.4 oz (56.9 kg)   Physical Exam Constitutional:      Appearance: She is not diaphoretic.  HENT:     Head: Normocephalic.     Right Ear: Tympanic membrane, ear canal and external ear normal.     Left Ear: Tympanic membrane, ear canal and external ear normal.     Nose: Nose normal. No mucosal edema or rhinorrhea.     Mouth/Throat:     Pharynx: Uvula midline. No oropharyngeal exudate.  Eyes:     Conjunctiva/sclera: Conjunctivae normal.  Neck:     Thyroid: No thyromegaly.     Trachea: Trachea normal. No tracheal tenderness or tracheal deviation.  Cardiovascular:     Rate and Rhythm: Normal rate and regular rhythm.     Heart sounds: Normal heart sounds, S1 normal and S2 normal. No murmur heard. Pulmonary:     Effort: No respiratory distress.     Breath sounds: Normal breath sounds. No stridor. No wheezing or rales.  Lymphadenopathy:     Head:     Right side of head: No tonsillar adenopathy.     Left side of head: No tonsillar adenopathy.     Cervical: No cervical adenopathy.  Skin:    Findings: No erythema or rash.     Nails: There is no clubbing.  Neurological:     Mental Status: She is alert.     Diagnostics: Spirometry was performed and demonstrated an FEV1 of 1.46 at 79 % of predicted.   Assessment and Plan:   1. Asthma, severe persistent,  well-controlled   2. LPRD (laryngopharyngeal reflux disease)   3. Diaphragm paralysis    1. Allergen avoidance measures - dust mite  2. Treat and prevent inflammation of airway:   A. DULERA 200 - 2 inhalations 1-2 times per day w/ spacer (empty lungs)  3. Treat and prevent reflex / LPR:   A. Omeprazole 40 mg -1 tablet in AM  B. Famotidine 40 mg - 1 tablet in PM    C. Decrease caffeine and chocolate and  alcohol consumption  D. Replace throat clearing with swallowing/drinking maneuver  4. If needed:   A. Albuterol HFA - 2 inhalations every 4-6 hours  B. Cetirizine 10 mg - 1 tablet 1 time per day  5. Return to clinic in 6 months or earlier if problem.    Erika Ramsey will need to determine if changing her Markus Daft to [facility] gives rise to less cough, whether using her Dulera 1 or 2 times per day makes a difference regarding her cough, whether adding famotidine to her omeprazole makes a difference regarding her cough and throat clearing.  She will need to work through these issues over the course of the next several months.  She appears to have a good understanding of these issues and how to work through these issues.  I will see her back in this clinic in 6 months or earlier if there is a problem.  Laurette Schimke, MD Allergy / Immunology [facility]

## 2023-01-31 NOTE — Patient Instructions (Addendum)
  1. Allergen avoidance measures - dust mite  2. Treat and prevent inflammation of airway:   A. DULERA 200 - 2 inhalations 1-2 times per day w/ spacer (empty lungs)  3. Treat and prevent reflex / LPR:   A. Omeprazole 40 mg -1 tablet in AM  B. Famotidine 40 mg - 1 tablet in PM    C. Decrease caffeine and chocolate and alcohol consumption  D. Replace throat clearing with swallowing/drinking maneuver  4. If needed:   A. Albuterol HFA - 2 inhalations every 4-6 hours  B. Cetirizine 10 mg - 1 tablet 1 time per day  5. Return to clinic in 6 months or earlier if problem.

## 2023-02-01 ENCOUNTER — Encounter: Payer: Self-pay | Admitting: Allergy and Immunology

## 2023-02-06 ENCOUNTER — Telehealth: Payer: Self-pay

## 2023-02-06 ENCOUNTER — Other Ambulatory Visit (HOSPITAL_COMMUNITY): Payer: Self-pay

## 2023-02-06 NOTE — Telephone Encounter (Signed)
Pharmacy Patient Advocate Encounter   Received notification from CoverMyMeds that prior authorization for [facility] 200-5MCG/ACT aerosol is required/requested.   Insurance verification completed.   The patient is insured through [employer] .   Per test claim: PA required; PA submitted to above mentioned insurance via CoverMyMeds Key/confirmation #/EOC [id number] Status is pending

## 2023-02-07 MED ORDER — FLUTICASONE-SALMETEROL 115-21 MCG/ACT IN AERO: 2.0000 | INHALATION_SPRAY | Freq: Two times a day (BID) | RESPIRATORY_TRACT | 12 refills | Status: AC

## 2023-02-07 NOTE — Addendum Note (Signed)
Addended by: Robet Leu A on: 02/07/2023 05:19 PM   Modules accepted: Orders

## 2023-02-07 NOTE — Telephone Encounter (Signed)
Called and spoke to patient to inform her that per Dr. Lucie Leather her inhaler has been changed to Advair 115 with two inhalations BID. Patient made aware that the PA was denied per insurance for [facility].

## 2023-02-07 NOTE — Telephone Encounter (Signed)
Pharmacy Patient Advocate Encounter  Received notification from [facility] that Prior Authorization for [facility] 200-5MCG/ACT aerosol has been DENIED.  Full denial letter will be uploaded to the media tab. See denial reason below.  We cover this drug when our criteria are met. The unmet criteria are: has tried or cannot use ONE of the following: Wixela Inhub, fluticasone-salmeterol, Adviar HFA, or Symbicort.   PA #/Case ID/Reference #: [id number]

## 2023-02-07 NOTE — Telephone Encounter (Signed)
Patients insurance has denied Dulera. Insurance stated that she must try and fail ONE of the following: Wixela Inhub, fluticasone-salmeterol, Adviar HFA, or Symbicort.  Please advise on what to send in and the directions. Please and thank you

## 2023-03-27 ENCOUNTER — Encounter: Payer: Self-pay | Admitting: Hematology and Oncology

## 2023-03-27 DIAGNOSIS — Z1231 Encounter for screening mammogram for malignant neoplasm of breast: Secondary | ICD-10-CM

## 2023-03-31 ENCOUNTER — Encounter: Payer: Self-pay | Admitting: Hematology and Oncology

## 2023-03-31 ENCOUNTER — Other Ambulatory Visit: Payer: Self-pay | Admitting: *Deleted

## 2023-03-31 ENCOUNTER — Other Ambulatory Visit: Payer: Self-pay | Admitting: Obstetrics and Gynecology

## 2023-03-31 DIAGNOSIS — C50212 Malignant neoplasm of upper-inner quadrant of left female breast: Secondary | ICD-10-CM

## 2023-03-31 DIAGNOSIS — M8580 Other specified disorders of bone density and structure, unspecified site: Secondary | ICD-10-CM

## 2023-04-24 ENCOUNTER — Encounter: Payer: Self-pay | Admitting: Hematology and Oncology

## 2023-05-02 ENCOUNTER — Inpatient Hospital Stay: Payer: Medicare PPO | Admitting: Hematology and Oncology

## 2023-05-04 ENCOUNTER — Ambulatory Visit
Admission: RE | Admit: 2023-05-04 | Discharge: 2023-05-04 | Disposition: A | Discharge status: Home / Self Care | Payer: Medicare PPO | Source: Ambulatory Visit | Attending: Hematology and Oncology | Admitting: Hematology and Oncology

## 2023-05-04 DIAGNOSIS — Z170 Estrogen receptor positive status [ER+]: Secondary | ICD-10-CM

## 2023-05-15 ENCOUNTER — Inpatient Hospital Stay: Payer: Medicare PPO | Attending: Hematology and Oncology | Admitting: Hematology and Oncology

## 2023-05-15 VITALS — BP 130/63 | HR 69 | Temp 97.7°F | Resp 16 | Ht 61.0 in | Wt 126.1 lb

## 2023-05-15 DIAGNOSIS — C50212 Malignant neoplasm of upper-inner quadrant of left female breast: Secondary | ICD-10-CM | POA: Insufficient documentation

## 2023-05-15 DIAGNOSIS — Z1732 Human epidermal growth factor receptor 2 negative status: Secondary | ICD-10-CM | POA: Insufficient documentation

## 2023-05-15 DIAGNOSIS — Z170 Estrogen receptor positive status [ER+]: Secondary | ICD-10-CM | POA: Insufficient documentation

## 2023-05-15 DIAGNOSIS — M8589 Other specified disorders of bone density and structure, multiple sites: Secondary | ICD-10-CM | POA: Diagnosis not present

## 2023-05-15 DIAGNOSIS — J45909 Unspecified asthma, uncomplicated: Secondary | ICD-10-CM | POA: Diagnosis not present

## 2023-05-15 DIAGNOSIS — Z1721 Progesterone receptor positive status: Secondary | ICD-10-CM | POA: Diagnosis not present

## 2023-05-15 DIAGNOSIS — Z79899 Other long term (current) drug therapy: Secondary | ICD-10-CM | POA: Diagnosis not present

## 2023-05-15 DIAGNOSIS — Z79811 Long term (current) use of aromatase inhibitors: Secondary | ICD-10-CM | POA: Insufficient documentation

## 2023-05-15 MED ORDER — LETROZOLE 2.5 MG PO TABS: 2.5000 mg | ORAL_TABLET | Freq: Every day | ORAL | 3 refills | Status: AC
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
## 2023-05-15 NOTE — Assessment & Plan Note (Signed)
 05/20/2020:Screening mammogram showed a possible left breast mass. Diagnostic mammogram and US showed a 0.4cm mass at the 10 o'clock position in the left breast. Biopsy showed IDC, grade 2, HER-2 equivocal by IHC (2+), ER/PR+ 100%, Ki67 15%.    Treatment plan: 1.  Breast conserving surgery: 06/25/20: Grade 2 IDC, 0.5 cm, Margins Neg, Er 100%, PR 100%, Her 2 Neg, Ki 67: 15% 2. Based on negative margins and good Prognostic markers, we decided that RT is not necessary 3.  Adjuvant antiestrogen therapy with anastrozole 1 mg daily X 5 years switched to letrozole May 2022 ---------------------------------------------------------------- Current treatment: Anastrozole 1 mg daily started 07/03/2020 switched to letrozole for intolerance Letrozole toxicities: Denies any adverse effects on letrozole therapy. . Bone density 09/22/20: T score -2.2 (osteopenia)   Breast cancer surveillance: 1.  Breast exam 05/15/2023: Benign 2. mammogram 05/04/2023: Benign breast density category B   Return to clinic in 1 year for follow-up

## 2023-08-08 ENCOUNTER — Ambulatory Visit: Payer: Medicare PPO | Admitting: Allergy and Immunology

## 2023-08-08 ENCOUNTER — Encounter: Payer: Self-pay | Admitting: Allergy and Immunology

## 2023-08-08 ENCOUNTER — Other Ambulatory Visit: Payer: Self-pay

## 2023-08-08 VITALS — BP 134/70 | HR 60 | Temp 98.2°F | Ht 61.0 in | Wt 122.3 lb

## 2023-08-08 DIAGNOSIS — J986 Disorders of diaphragm: Secondary | ICD-10-CM

## 2023-08-08 DIAGNOSIS — J4550 Severe persistent asthma, uncomplicated: Secondary | ICD-10-CM | POA: Diagnosis not present

## 2023-08-08 DIAGNOSIS — K219 Gastro-esophageal reflux disease without esophagitis: Secondary | ICD-10-CM

## 2023-08-08 MED ORDER — OMEPRAZOLE 40 MG PO CPDR: 40.0000 mg | DELAYED_RELEASE_CAPSULE | Freq: Every morning | ORAL | 1 refills | Status: DC

## 2023-08-08 MED ORDER — FAMOTIDINE 40 MG PO TABS: 40.0000 mg | ORAL_TABLET | Freq: Every evening | ORAL | 1 refills | Status: DC | PRN

## 2023-08-08 MED ORDER — CETIRIZINE HCL 10 MG PO TABS: 10.0000 mg | ORAL_TABLET | Freq: Every day | ORAL | 1 refills | Status: DC | PRN

## 2023-08-08 MED ORDER — ALBUTEROL SULFATE HFA 108 (90 BASE) MCG/ACT IN AERS: INHALATION_SPRAY | RESPIRATORY_TRACT | 1 refills | Status: DC

## 2023-08-08 MED ORDER — BREZTRI AEROSPHERE 160-9-4.8 MCG/ACT IN AERO: INHALATION_SPRAY | RESPIRATORY_TRACT | 3 refills | Status: DC

## 2023-08-08 NOTE — Patient Instructions (Addendum)
  1. Allergen avoidance measures - dust mite  2. Treat and prevent inflammation of airway:   A. Breztri  - 2 inhalations 1-2 times per day w/ spacer (empty lungs)  3. Treat and prevent reflex / LPR:   A. Omeprazole  40 mg -1 tablet in AM  B. Famotidine  40 mg - 1 tablet in PM if needed  C. Decrease caffeine and chocolate and alcohol consumption  D. Replace throat clearing with swallowing/drinking maneuver  4. If needed:   A. Albuterol  HFA - 2 inhalations every 4-6 hours  B. Cetirizine  10 mg - 1 tablet 1 time per day  5. Return to clinic in 6 months or earlier if problem.    6. Influenza = Tamiflu. Covid = Paxlovid

## 2023-08-08 NOTE — Progress Notes (Unsigned)
 [place] [facility] [place] - [place] - [place] - [facility]   Follow-up Note  Referring Provider: Allene Ivan, MD Primary Provider: Allene Ivan, MD Date of Office Visit: 08/08/2023  Subjective:   Erika Ramsey (DOB: 1945-10-09) is a 78 y.o. female who [facility] in re-evaluation of the following:  HPI: Erika Ramsey returns to this clinic in evaluation of asthma, LPR, history of left diaphragmatic paralysis.  I last saw her in this clinic 31 January 2023.  She has been doing pretty well regarding her coughing and her throat clearing.  She has been using her omeprazole  just 1 time per day and does not use any famotidine  at this point.  She has been using her Breztri  twice a day and does not need to use any albuterol .  She can exert herself without any problem, participating in yoga, and has not required a systemic steroid or an antibiotic for any type of airway issue.  She has noticed that when she wakes up in the morning she is extremely dry in her mouth.  She did visit with ENT earlier this year and her laryngeal exam checked out okay.  Allergies as of 08/08/2023       Reactions   Dust Mite Extract    Other Reaction(s): Respiratory Distress        Medication List    albuterol  108 (90 Base) MCG/ACT inhaler Commonly known as: Ventolin  HFA Inhale 2 puffs into the lungs every 4 (four) hours as needed for wheezing or shortness of breath.   ALPRAZolam  0.25 MG tablet Commonly known as: XANAX  Take by mouth daily as needed. Takes 1/2   amLODipine  5 MG tablet Commonly known as: NORVASC  Take 1 tablet (5 mg total) by mouth daily.   Breztri  Aerosphere 160-9-4.8 MCG/ACT Aero inhaler Generic drug: budesonide-glycopyrrolate-formoterol  Inhale 2 puffs into the lungs.   CALCIUM  PO Take by mouth.   cetirizine  10 MG tablet Commonly known as: ZYRTEC  Take 1 tablet (10 mg total) by mouth daily as needed (Can take an extra dose during  flare ups.).   chlorthalidone  25 MG tablet Commonly known as: HYGROTON  Take 12.5 mg by mouth daily.   famotidine  40 MG tablet Commonly known as: PEPCID  Take 1 tablet (40 mg total) by mouth at bedtime.   Fish Oil 500 MG Caps   fluticasone -salmeterol 115-21 MCG/ACT inhaler Commonly known as: Advair HFA Inhale 2 puffs into the lungs 2 (two) times daily.   letrozole  2.5 MG tablet Commonly known as: FEMARA  Take 1 tablet (2.5 mg total) by mouth daily.   losartan  100 MG tablet Commonly known as: COZAAR  Take 100 mg by mouth daily.   multivitamin tablet Take 1 tablet by mouth daily.   omeprazole  40 MG capsule Commonly known as: PRILOSEC Take 1 capsule (40 mg total) by mouth in the morning.   Red Yeast Rice 600 MG Tabs   Spacer/Aero-Holding Chambers Devi 1 Device by Does not apply route as directed.   Spacer/Aero-Holding [employer] 1 Device by Does not apply route as directed.    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   Decreased libido    Hypertension    Laryngopharyngeal reflux (LPR)    Neuromuscular disorder (HCC)    tremors  Osteopenia 08/2017   T score -1.6 FRAX 11% / 2% stable from prior DEXA   Pneumonia    HX OF    Shingles     Past Surgical History:  Procedure Laterality Date   BREAST CYST ASPIRATION     BREAST LUMPECTOMY Left 2022   BREAST LUMPECTOMY WITH RADIOACTIVE SEED LOCALIZATION Left 06/25/2020   Procedure: LEFT BREAST LUMPECTOMY WITH RADIOACTIVE SEED LOCALIZATION;  Surgeon: Caralyn Chandler, MD;  Location:  SURGERY CENTER;  Service: General;  Laterality: Left;   COLPOSCOPY  1977   CIN 3   HAND SURGERY     KNEE SURGERY  2019   PELVIC LAPAROSCOPY     Diag Lap   TOTAL HIP ARTHROPLASTY Right 05/04/2016   Procedure: RIGHT TOTAL HIP ARTHROPLASTY ANTERIOR APPROACH;  Surgeon: Liliane Rei,  MD;  Location: [facility];  Service: Orthopedics;  Laterality: Right;   TOTAL KNEE ARTHROPLASTY Right 01/27/2020   Procedure: TOTAL KNEE ARTHROPLASTY;  Surgeon: Liliane Rei, MD;  Location: [facility];  Service: Orthopedics;  Laterality: Right;    TOTAL KNEE ARTHROPLASTY Left 05/04/2020   Procedure: TOTAL KNEE ARTHROPLASTY;  Surgeon: Liliane Rei, MD;  Location: [facility];  Service: Orthopedics;  Laterality: Left;     Review of systems negative except as noted in HPI / PMHx or noted below:  Review of Systems  Constitutional: Negative.   HENT: Negative.    Eyes: Negative.   Respiratory: Negative.    Cardiovascular: Negative.   Gastrointestinal: Negative.   Genitourinary: Negative.   Musculoskeletal: Negative.   Skin: Negative.   Neurological: Negative.   Endo/Heme/Allergies: Negative.   Psychiatric/Behavioral: Negative.       Objective:   Vitals:   08/08/23 1343  BP: 134/70  Pulse: 60  Temp: 98.2 F (36.8 C)  SpO2: 98%   Height: 5\' 1"  (154.9 cm)  Weight: 122 lb 4.8 oz (55.5 kg)   Physical Exam Constitutional:      Appearance: She is not diaphoretic.     Comments: Vibrato voice   HENT:     Head: Normocephalic.     Right Ear: Tympanic membrane, ear canal and external ear normal.     Left Ear: Tympanic membrane, ear canal and external ear normal.     Nose: Nose normal. No mucosal edema or rhinorrhea.     Mouth/Throat:     Pharynx: Uvula midline. No oropharyngeal exudate.  Eyes:     Conjunctiva/sclera: Conjunctivae normal.  Neck:     Thyroid : No thyromegaly.     Trachea: Trachea normal. No tracheal tenderness or tracheal deviation.  Cardiovascular:     Rate and Rhythm: Normal rate and regular rhythm.     Heart sounds: Normal heart sounds, S1 normal and S2 normal. No murmur heard. Pulmonary:     Effort: No respiratory distress.     Breath sounds: Normal breath sounds. No stridor. No wheezing or rales.  Lymphadenopathy:     Head:     Right side of head: No  tonsillar adenopathy.     Left side of head: No tonsillar adenopathy.     Cervical: No cervical adenopathy.  Skin:    Findings: No erythema or rash.     Nails: There is no clubbing.  Neurological:     Mental Status: She is alert.     Diagnostics:    Spirometry was performed and demonstrated an FEV1 of 1.48 at 81 % of predicted.  Results of a rhinoscopy performed by Dr. Tellis Feathers, ENT, 14 February 2023 identified the following:  Left vocal fold with small varix. Mild posterior commissure hypertrophy. Spasmodic movement with phonation.   Assessment and Plan:   1. Asthma, severe persistent, well-controlled   2. LPRD (laryngopharyngeal reflux disease)   3. Diaphragm paralysis    1. Allergen avoidance measures - dust mite  2. Treat and prevent inflammation of airway:   A. Breztri  - 2 inhalations 1-2 times per day w/ spacer (empty lungs)  3. Treat and prevent reflex / LPR:   A. Omeprazole  40 mg -1 tablet in AM  B. Famotidine  40 mg - 1 tablet in PM if needed  C. Decrease caffeine and chocolate and alcohol consumption  D. Replace throat clearing with swallowing/drinking maneuver  4. If needed:   A. Albuterol  HFA - 2 inhalations every 4-6 hours  B. Cetirizine  10 mg - 1 tablet 1 time per day  5. Return to clinic in 6 months or earlier if problem.    6. Influenza = Tamiflu. Covid = Paxlovid  Erika Ramsey is doing pretty well and I think there is opportunity to consolidate some of her treatment I have encouraged her to use her Breztri  just 1 time per day which will hopefully help her morning dry mouth and she can always add in famotidine  should it be required while she uses omeprazole  to address her LPR.  Will see her back in this clinic in 6 months or earlier if there is a problem.   Schuyler Custard, MD Allergy  / Immunology [facility]

## 2023-08-09 ENCOUNTER — Telehealth: Payer: Self-pay | Admitting: Obstetrics and Gynecology

## 2023-08-09 ENCOUNTER — Encounter: Payer: Self-pay | Admitting: Allergy and Immunology

## 2023-08-09 NOTE — Telephone Encounter (Signed)
 Late on 2 year follow-up with concerning prior bone scan Dr. Tia Flowers

## 2023-08-10 ENCOUNTER — Telehealth: Payer: Self-pay | Admitting: Obstetrics and Gynecology

## 2023-08-10 DIAGNOSIS — M8000XA Age-related osteoporosis with current pathological fracture, unspecified site, initial encounter for fracture: Secondary | ICD-10-CM

## 2023-08-10 NOTE — Telephone Encounter (Signed)
 Was at [facility] for dxa and now all scans are canceled there. Referral placed to [facility] and message sent  Dr. Tia Flowers

## 2023-08-14 NOTE — Addendum Note (Signed)
 Addended by: Yale Held on: 08/14/2023 05:13 PM   Modules accepted: Orders

## 2023-08-14 NOTE — Telephone Encounter (Signed)
 Per review of EPIC, patient is scheduled for BMD at [facility] on 01/22/24.

## 2023-08-14 NOTE — Telephone Encounter (Signed)
 BMD was scheduled from patients order placed 01/22/24.   Duplicate order cancelled.

## 2023-08-16 NOTE — Telephone Encounter (Signed)
 [facility] order to Dr. Tia Flowers to be signed then faxed.   Encounter closed.

## 2023-08-22 LAB — HM DEXA SCAN: HM Dexa Scan: NORMAL

## 2023-08-24 ENCOUNTER — Ambulatory Visit: Payer: Self-pay | Admitting: Obstetrics and Gynecology

## 2023-08-24 ENCOUNTER — Encounter: Payer: Self-pay | Admitting: Obstetrics and Gynecology

## 2023-10-02 ENCOUNTER — Other Ambulatory Visit: Payer: Self-pay | Admitting: *Deleted

## 2023-10-02 DIAGNOSIS — M810 Age-related osteoporosis without current pathological fracture: Secondary | ICD-10-CM

## 2023-10-02 MED ORDER — DENOSUMAB 60 MG/ML ~~LOC~~ SOSY: 60.0000 mg | PREFILLED_SYRINGE | Freq: Once | SUBCUTANEOUS | Status: AC

## 2023-11-20 ENCOUNTER — Other Ambulatory Visit: Payer: Medicare PPO

## 2023-12-13 ENCOUNTER — Ambulatory Visit (INDEPENDENT_AMBULATORY_CARE_PROVIDER_SITE_OTHER): Admitting: Obstetrics and Gynecology

## 2023-12-13 ENCOUNTER — Encounter: Payer: Self-pay | Admitting: Obstetrics and Gynecology

## 2023-12-13 VITALS — BP 104/58 | HR 67 | Ht 60.24 in | Wt 120.6 lb

## 2023-12-13 DIAGNOSIS — N952 Postmenopausal atrophic vaginitis: Secondary | ICD-10-CM

## 2023-12-13 DIAGNOSIS — C50212 Malignant neoplasm of upper-inner quadrant of left female breast: Secondary | ICD-10-CM | POA: Diagnosis not present

## 2023-12-13 DIAGNOSIS — Z9289 Personal history of other medical treatment: Secondary | ICD-10-CM | POA: Diagnosis not present

## 2023-12-13 DIAGNOSIS — Z01419 Encounter for gynecological examination (general) (routine) without abnormal findings: Secondary | ICD-10-CM

## 2023-12-13 DIAGNOSIS — Z79818 Long term (current) use of other agents affecting estrogen receptors and estrogen levels: Secondary | ICD-10-CM

## 2023-12-13 NOTE — Progress Notes (Signed)
 78 y.o. y.o. female here for medicare annual exam. No LMP recorded. Patient is postmenopausal.     She denies any PM bleeding.   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 Repeat pap smears negative. 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 Prior was -2.2 Declines treatment. Counseled on importance of treatment. gcg-hx of breast cancer 2022, MMG 05/04/23 There is no height or weight on file to calculate BMI.    There were no vitals taken for this visit.     Component Value Date/Time   DIAGPAP  11/03/2022 1054    - Negative for intraepithelial lesion or malignancy (NILM)   DIAGPAP  10/06/2021 1633    - Negative for intraepithelial lesion or malignancy (NILM)   ADEQPAP  11/03/2022 1054    Satisfactory for evaluation. The presence or absence of an   ADEQPAP  11/03/2022 1054    endocervical/transformation zone component cannot be determined because   ADEQPAP of atrophy. 11/03/2022 1054    GYN HISTORY:    Component Value Date/Time   DIAGPAP  11/03/2022 1054    - Negative for intraepithelial lesion or malignancy (NILM)   DIAGPAP  10/06/2021 1633    - Negative for intraepithelial lesion or malignancy (NILM)   ADEQPAP  11/03/2022 1054    Satisfactory for evaluation. The presence or absence of an   ADEQPAP  11/03/2022 1054    endocervical/transformation zone component cannot be determined because   ADEQPAP of atrophy. 11/03/2022 1054    OB History  Gravida Para Term Preterm AB Living  0 0 0 0 0 0  SAB IAB Ectopic Multiple Live Births  0 0 0 0 0    Past Medical History:  Diagnosis Date   Arthritis    hips back, knees.hand   Asthma    Atrophic vaginitis    Breast cyst    Cancer (HCC)     breast   Cervical dysplasia 1977   CIN 3   Decreased libido    Hypertension    Laryngopharyngeal reflux (LPR)    Neuromuscular disorder (HCC)    tremors   Osteopenia 08/2017   T score -1.6 FRAX 11% / 2% stable from prior DEXA   Pneumonia    HX OF    Shingles     Past Surgical History:  Procedure Laterality Date   BREAST CYST ASPIRATION     BREAST LUMPECTOMY Left 2022   BREAST LUMPECTOMY WITH RADIOACTIVE SEED LOCALIZATION Left 06/25/2020   Procedure: LEFT BREAST LUMPECTOMY WITH RADIOACTIVE SEED LOCALIZATION;  Surgeon: Curvin Deward MOULD, MD;  Location: [facility];  Service: General;  Laterality: Left;   COLPOSCOPY  1977   CIN 3   HAND SURGERY     KNEE SURGERY  2019   PELVIC LAPAROSCOPY     Diag Lap   TOTAL HIP ARTHROPLASTY Right 05/04/2016   Procedure: RIGHT TOTAL HIP ARTHROPLASTY ANTERIOR APPROACH;  Surgeon: Dempsey Moan, MD;  Location: [facility];  Service: Orthopedics;  Laterality: Right;   TOTAL KNEE ARTHROPLASTY Right 01/27/2020   Procedure: TOTAL KNEE ARTHROPLASTY;  Surgeon: Moan Dempsey, MD;  Location: [facility];  Service: Orthopedics;  Laterality: Right;    TOTAL KNEE ARTHROPLASTY Left 05/04/2020   Procedure:  TOTAL KNEE ARTHROPLASTY;  Surgeon: Melodi Lerner, MD;  Location: [facility];  Service: Orthopedics;  Laterality: Left;     Current Outpatient Medications on File Prior to Visit  Medication Sig Dispense Refill   albuterol  (VENTOLIN  HFA) 108 (90 Base) MCG/ACT inhaler 2 inhalations every 4-6 hours as needed for cough, wheeze, shortness of breath, chest tightness. 18 g 1   BREZTRI  AEROSPHERE 160-9-4.8 MCG/ACT AERO inhaler 2 inhalations 1-2 times per day with spacer. 10.7 g 3   CALCIUM  PO Take by mouth.     cetirizine  (ZYRTEC ) 10 MG tablet Take 1 tablet (10 mg total) by mouth daily as needed (Can take an extra dose during flare ups.). 90 tablet 1   chlorthalidone  (HYGROTON ) 25 MG tablet Take 12.5 mg by mouth daily.     losartan  (COZAAR ) 100 MG  tablet Take 100 mg by mouth daily.     Multiple Vitamin (MULTIVITAMIN) tablet Take 1 tablet by mouth daily.     Omega-3 Fatty Acids (FISH OIL) 500 MG CAPS      Red Yeast Rice 600 MG TABS      Spacer/Aero-Holding Chambers DEVI 1 Device by Does not apply route as directed. 1 each 1   Spacer/Aero-Holding Chambers DEVI 1 Device by Does not apply route as directed. 1 each 1   ALPRAZolam  (XANAX ) 0.25 MG tablet Take by mouth daily as needed. Takes 1/2 (Patient not taking: Reported on 08/08/2023)     amLODipine  (NORVASC ) 5 MG tablet Take 1 tablet (5 mg total) by mouth daily. (Patient taking differently: Take by mouth daily. Takes 1 & 1/2) 30 tablet 0   famotidine  (PEPCID ) 40 MG tablet Take 1 tablet (40 mg total) by mouth at bedtime as needed for heartburn or indigestion. 90 tablet 1   fluticasone -salmeterol (ADVAIR HFA) 115-21 MCG/ACT inhaler Inhale 2 puffs into the lungs 2 (two) times daily. (Patient not taking: Reported on 08/08/2023) 1 each 12   letrozole  (FEMARA ) 2.5 MG tablet Take 1 tablet (2.5 mg total) by mouth daily. 90 tablet 3   omeprazole  (PRILOSEC) 40 MG capsule Take 1 capsule (40 mg total) by mouth in the morning. 90 capsule 1   Current Facility-Administered Medications on File Prior to Visit  Medication Dose Route Frequency Provider Last Rate Last Admin   denosumab  (PROLIA ) injection 60 mg  60 mg Subcutaneous Once Glennon Almarie POUR, MD        Social History   Socioeconomic History   Marital status: Divorced    Spouse name: Not on file   Number of children: Not on file   Years of education: Not on file   Highest education level: Not on file  Occupational History   Not on file  Tobacco Use   Smoking status: Former    Current packs/day: 0.00    Types: Cigarettes    Quit date: 30    Years since quitting: 46.8   Smokeless tobacco: Never  Vaping Use   Vaping status: Never Used  Substance and Sexual Activity   Alcohol use: Yes    Comment: occ   Drug use: No   Sexual  activity: Not Currently    Partners: Male    Birth control/protection: Post-menopausal    Comment: intercourse age 61, sexual partners less than 5  Other Topics Concern   Not on file  Social History Narrative   Not on file   Social Drivers of Health   Financial Resource Strain: Not on file  Food Insecurity: Not on file  Transportation  Needs: Not on file  Physical Activity: Not on file  Stress: Not on file  Social Connections: Not on file  Intimate Partner Violence: Not on file    Family History  Problem Relation Age of Onset   Hypertension Mother    Heart disease Mother    Skin cancer Mother    Cancer Father        Colon cancer   Heart disease Father    Allergic rhinitis Father    Allergic rhinitis Sister    Breast cancer Maternal Aunt        Great aunt 16's   Diabetes Maternal Grandmother    Stomach cancer Maternal Grandfather    Cancer Paternal Grandfather        Colon cancer   Immunodeficiency Neg Hx    Urticaria Neg Hx      Allergies  Allergen Reactions   Dust Mite Extract     Other Reaction(s): Respiratory Distress      Patient's last menstrual period was No LMP recorded. Patient is postmenopausal..            Review of Systems Alls systems reviewed and are negative.     Physical Exam Constitutional:      Appearance: Normal appearance.  Genitourinary:     Vulva and urethral meatus normal.     No lesions in the vagina.     Right Labia: No rash, lesions or skin changes.    Left Labia: No lesions, skin changes or rash.    No vaginal discharge or tenderness.     No vaginal prolapse present.    Severe vaginal atrophy present.     Right Adnexa: not tender, not palpable and no mass present.    Left Adnexa: not tender, not palpable and no mass present.    No cervical motion tenderness or discharge.     Uterus is not enlarged, tender or irregular.  Breasts:    Right: Normal.     Left: Normal.  HENT:     Head: Normocephalic.  Neck:     Thyroid :  No thyroid  mass, thyromegaly or thyroid  tenderness.  Cardiovascular:     Rate and Rhythm: Normal rate and regular rhythm.     Heart sounds: Normal heart sounds, S1 normal and S2 normal.  Pulmonary:     Effort: Pulmonary effort is normal.     Breath sounds: Normal breath sounds and air entry.  Abdominal:     General: There is no distension.     Palpations: Abdomen is soft. There is no mass.     Tenderness: There is no abdominal tenderness. There is no guarding or rebound.  Musculoskeletal:        General: Normal range of motion.     Cervical back: Full passive range of motion without pain, normal range of motion and neck supple. No tenderness.     Right lower leg: No edema.     Left lower leg: No edema.  Neurological:     Mental Status: She is alert.  Skin:    General: Skin is warm.  Psychiatric:        Mood and Affect: Mood normal.        Behavior: Behavior normal.        Thought Content: Thought content normal.  Vitals and nursing note reviewed. Exam conducted with a chaperone present.       A:         Well Woman medicare GYN exam  P:        Pap smear not indicated Encouraged annual mammogram screening Colon cancer screening aged out DXA up-to-date declines treatment Labs and immunizations to do with PMD Discussed breast self exams Encouraged healthy lifestyle practices Encouraged Vit D and Calcium    No follow-ups on file.  Almarie MARLA Carpen

## 2024-01-22 ENCOUNTER — Other Ambulatory Visit (HOSPITAL_BASED_OUTPATIENT_CLINIC_OR_DEPARTMENT_OTHER)

## 2024-02-06 ENCOUNTER — Other Ambulatory Visit: Payer: Self-pay

## 2024-02-06 ENCOUNTER — Encounter: Payer: Self-pay | Admitting: Allergy and Immunology

## 2024-02-06 ENCOUNTER — Ambulatory Visit: Admitting: Allergy and Immunology

## 2024-02-06 VITALS — BP 130/82 | HR 63 | Temp 98.6°F | Resp 14 | Ht 61.0 in | Wt 122.9 lb

## 2024-02-06 DIAGNOSIS — K219 Gastro-esophageal reflux disease without esophagitis: Secondary | ICD-10-CM

## 2024-02-06 DIAGNOSIS — J986 Disorders of diaphragm: Secondary | ICD-10-CM | POA: Diagnosis not present

## 2024-02-06 DIAGNOSIS — J4550 Severe persistent asthma, uncomplicated: Secondary | ICD-10-CM

## 2024-02-06 DIAGNOSIS — R682 Dry mouth, unspecified: Secondary | ICD-10-CM | POA: Diagnosis not present

## 2024-02-06 MED ORDER — FAMOTIDINE 40 MG PO TABS: 40.0000 mg | ORAL_TABLET | Freq: Every evening | ORAL | 1 refills | Status: AC | PRN

## 2024-02-06 MED ORDER — OMEPRAZOLE 40 MG PO CPDR: 40.0000 mg | DELAYED_RELEASE_CAPSULE | Freq: Every morning | ORAL | 1 refills | Status: AC

## 2024-02-06 MED ORDER — BREZTRI AEROSPHERE 160-9-4.8 MCG/ACT IN AERO: INHALATION_SPRAY | RESPIRATORY_TRACT | 3 refills | Status: AC

## 2024-02-06 MED ORDER — ALBUTEROL SULFATE HFA 108 (90 BASE) MCG/ACT IN AERS: INHALATION_SPRAY | RESPIRATORY_TRACT | 1 refills | Status: AC

## 2024-02-06 MED ORDER — CETIRIZINE HCL 10 MG PO TABS: 10.0000 mg | ORAL_TABLET | Freq: Every day | ORAL | 1 refills | Status: AC | PRN

## 2024-02-06 NOTE — Progress Notes (Unsigned)
  - [place] [facility] [place] [facility] [place] [facility] [place]   Follow-up Note  Referring Provider: Burney Darice CROME, MD Primary Provider: Burney Darice CROME, MD Date of Office Visit: 02/06/2024  Subjective:   Erika Ramsey (DOB: [birth year]) is a 78 y.o. female who [facility] in re-evaluation of the following:  HPI: Erika Ramsey returns to this clinic in evaluation of asthma, LPR, history of left diaphragmatic paralysis, history of dry mouth.  I last saw her in this clinic 08 August 2023.  She is doing quite well regarding her airway.  She still has a little bit of cough in association with throat clearing but is much better while using a combination of treatment directed against inflammation of her airway and reflux induced inflammation.  She is currently using her Breztri  just 1 time per day and currently using omeprazole  just 1 time per day and occasionally will add in some famotidine .  Her need for short acting bronchodilator is 0.  She can exert herself without any problem and have cold air exposure without any problem.  She has received this years flu vaccine and RSV vaccine as well.  Allergies as of 02/06/2024       Reactions   Dust Mite Extract    Other Reaction(s): Respiratory Distress        Medication List    albuterol  108 (90 Base) MCG/ACT inhaler Commonly known as: Ventolin  HFA 2 inhalations every 4-6 hours as needed for cough, wheeze, shortness of breath, chest tightness.   ALPRAZolam  0.25 MG tablet Commonly known as: XANAX  Take by mouth daily as needed. Takes 1/2   amLODipine  5 MG tablet Commonly known as: NORVASC  Take 1 tablet (5 mg total) by mouth daily.   Breztri  Aerosphere 160-9-4.8 MCG/ACT Aero inhaler Generic drug: budesonide-glycopyrrolate-formoterol  2 inhalations 1-2 times per day with spacer.   CALCIUM  PO Take by mouth.   cetirizine  10 MG tablet Commonly known as: ZYRTEC  Take 1 tablet (10 mg total) by mouth  daily as needed (Can take an extra dose during flare ups.).   chlorthalidone  25 MG tablet Commonly known as: HYGROTON  Take 12.5 mg by mouth daily.   famotidine  40 MG tablet Commonly known as: PEPCID  Take 1 tablet (40 mg total) by mouth at bedtime as needed for heartburn or indigestion.   Fish Oil 500 MG Caps   fluticasone -salmeterol 115-21 MCG/ACT inhaler Commonly known as: Advair HFA Inhale 2 puffs into the lungs 2 (two) times daily.   letrozole  2.5 MG tablet Commonly known as: FEMARA  Take 1 tablet (2.5 mg total) by mouth daily.   losartan  100 MG tablet Commonly known as: COZAAR  Take 100 mg by mouth daily.   multivitamin tablet Take 1 tablet by mouth daily.   omeprazole  40 MG capsule Commonly known as: PRILOSEC Take 1 capsule (40 mg total) by mouth in the morning.   Red Yeast Rice 600 MG Tabs   Spacer/Aero-Holding Chambers Devi 1 Device by Does not apply route as directed.   Spacer/Aero-Holding [employer] 1 Device by Does not apply route as directed.    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   Decreased libido    Hypertension    Laryngopharyngeal reflux (LPR)    Neuromuscular disorder (HCC)  tremors   Osteopenia 08/2017   T score -1.6 FRAX 11% / 2% stable from prior DEXA   Pneumonia    HX OF    Shingles     Past Surgical History:  Procedure Laterality Date   BREAST CYST ASPIRATION     BREAST LUMPECTOMY Left 2022   BREAST LUMPECTOMY WITH RADIOACTIVE SEED LOCALIZATION Left 06/25/2020   Procedure: LEFT BREAST LUMPECTOMY WITH RADIOACTIVE SEED LOCALIZATION;  Surgeon: Curvin Deward MOULD, MD;  Location: [facility];  Service: General;  Laterality: Left;   COLPOSCOPY  1977   CIN 3   HAND SURGERY     KNEE SURGERY  2019   PELVIC LAPAROSCOPY     Diag Lap   TOTAL HIP ARTHROPLASTY Right 05/04/2016    Procedure: RIGHT TOTAL HIP ARTHROPLASTY ANTERIOR APPROACH;  Surgeon: Dempsey Moan, MD;  Location: [facility];  Service: Orthopedics;  Laterality: Right;   TOTAL KNEE ARTHROPLASTY Right 01/27/2020   Procedure: TOTAL KNEE ARTHROPLASTY;  Surgeon: Moan Dempsey, MD;  Location: [facility];  Service: Orthopedics;  Laterality: Right;    TOTAL KNEE ARTHROPLASTY Left 05/04/2020   Procedure: TOTAL KNEE ARTHROPLASTY;  Surgeon: Moan Dempsey, MD;  Location: [facility];  Service: Orthopedics;  Laterality: Left;     Review of systems negative except as noted in HPI / PMHx or noted below:  Review of Systems  Constitutional: Negative.   HENT: Negative.    Eyes: Negative.   Respiratory: Negative.    Cardiovascular: Negative.   Gastrointestinal: Negative.   Genitourinary: Negative.   Musculoskeletal: Negative.   Skin: Negative.   Neurological: Negative.   Endo/Heme/Allergies: Negative.   Psychiatric/Behavioral: Negative.       Objective:   Vitals:   02/06/24 1330  BP: 130/82  Pulse: 63  Resp: 14  Temp: 98.6 F (37 C)  SpO2: 96%   Height: 5' 1 (154.9 cm)  Weight: 122 lb 14.4 oz (55.7 kg)   Physical Exam Constitutional:      Appearance: She is not diaphoretic.  HENT:     Head: Normocephalic.     Right Ear: Tympanic membrane, ear canal and external ear normal.     Left Ear: Tympanic membrane, ear canal and external ear normal.     Nose: Nose normal. No mucosal edema or rhinorrhea.     Mouth/Throat:     Pharynx: Uvula midline. No oropharyngeal exudate.  Eyes:     Conjunctiva/sclera: Conjunctivae normal.  Neck:     Thyroid : No thyromegaly.     Trachea: Trachea normal. No tracheal tenderness or tracheal deviation.  Cardiovascular:     Rate and Rhythm: Normal rate and regular rhythm.     Heart sounds: Normal heart sounds, S1 normal and S2 normal. No murmur heard. Pulmonary:     Effort: No respiratory distress.     Breath sounds: Normal breath sounds. No stridor. No wheezing or  rales.  Lymphadenopathy:     Head:     Right side of head: No tonsillar adenopathy.     Left side of head: No tonsillar adenopathy.     Cervical: No cervical adenopathy.  Skin:    Findings: No erythema or rash.     Nails: There is no clubbing.  Neurological:     Mental Status: She is alert.     Diagnostics: Spirometry was performed and demonstrated an FEV1 of 1.42 at 78 % of predicted.  Assessment and Plan:   1. Asthma, severe persistent, well-controlled (HCC)   2. LPRD (laryngopharyngeal  reflux disease)   3. Diaphragm paralysis     1. Allergen avoidance measures - dust mite  2. Treat and prevent inflammation of airway:   A. Breztri  - 2 inhalations 1-2 times per day w/ spacer (empty lungs)  3. Treat and prevent reflex / LPR:   A. Omeprazole  40 mg -1 tablet in AM  B. Famotidine  40 mg - 1 tablet in PM if needed  C. Decrease caffeine and chocolate and alcohol consumption  D. Replace throat clearing with swallowing/drinking maneuver  4. If needed:   A. Albuterol  HFA - 2 inhalations every 4-6 hours  B. Cetirizine  10 mg - 1 tablet 1 time per day (dry mouth???)  5. Return to clinic in 12 months or earlier if problem.    6. Influenza = Tamiflu. Covid = Paxlovid  Overall Erika Ramsey appears to be doing pretty well using her Breztri  just 1 time per day and using her omeprazole  once a day and occasionally adding in the famotidine  and rarely if ever using albuterol .  She has very little lifestyle limiting with her issues as long as she continues on the plan noted above.  She has a very good understanding of her disease state and how her medications work and appropriate dosing of her medications depending on disease activity.  Assuming she does well I will see her back in this clinic in 1 year or earlier if there is a problem.  Camellia Denis, MD Allergy  / Immunology [facility]

## 2024-02-06 NOTE — Patient Instructions (Addendum)
  1. Allergen avoidance measures - dust mite  2. Treat and prevent inflammation of airway:   A. Breztri  - 2 inhalations 1-2 times per day w/ spacer (empty lungs)  3. Treat and prevent reflex / LPR:   A. Omeprazole  40 mg -1 tablet in AM  B. Famotidine  40 mg - 1 tablet in PM if needed  C. Decrease caffeine and chocolate and alcohol consumption  D. Replace throat clearing with swallowing/drinking maneuver  4. If needed:   A. Albuterol  HFA - 2 inhalations every 4-6 hours  B. Cetirizine  10 mg - 1 tablet 1 time per day (dry mouth???)  5. Return to clinic in 12 months or earlier if problem.    6. Influenza = Tamiflu. Covid = Paxlovid

## 2024-02-07 ENCOUNTER — Encounter: Payer: Self-pay | Admitting: Allergy and Immunology

## 2024-02-07 ENCOUNTER — Telehealth: Payer: Self-pay | Admitting: *Deleted

## 2024-02-07 NOTE — Addendum Note (Signed)
 Addended by: FERNANDEZ-VERNON, Vrishank Moster R on: 02/07/2024 08:36 AM   Modules accepted: Orders

## 2024-02-07 NOTE — Telephone Encounter (Signed)
 Labs have been ordered and given to [place]. Called the patient and informed. Patient verbalized understanding and will come in to have the labs drawn.

## 2024-02-07 NOTE — Telephone Encounter (Signed)
-----   Message from ERIC J KOZLOW sent at 02/07/2024  6:42 AM EST ----- Please let Erika Ramsey know that I looked through her medical record and could not find a blood test and investigation of her dry mouth.  Lets get her to obtain an ANA with reflex for the diagnosis of dry mouth.

## 2024-02-09 LAB — ENA+DNA/DS+SJORGEN'S
ENA RNP Ab: 0.2 AI (ref 0.0–0.9)
ENA SM Ab Ser-aCnc: <0.2 AI (ref 0.0–0.9)
ENA SSA (RO) Ab: <0.2 AI (ref 0.0–0.9)
ENA SSB (LA) Ab: <0.2 AI (ref 0.0–0.9)
dsDNA Ab: 1 [IU]/mL (ref 0–9)

## 2024-02-09 LAB — ANA W/REFLEX: Anti Nuclear Antibody (ANA): POSITIVE — AB

## 2024-02-12 ENCOUNTER — Encounter: Payer: Self-pay | Admitting: Allergy and Immunology

## 2024-02-12 ENCOUNTER — Ambulatory Visit: Payer: Self-pay | Admitting: Allergy and Immunology

## 2024-03-26 ENCOUNTER — Other Ambulatory Visit: Payer: Self-pay | Admitting: Hematology and Oncology

## 2024-03-26 DIAGNOSIS — Z853 Personal history of malignant neoplasm of breast: Secondary | ICD-10-CM

## 2024-05-06 ENCOUNTER — Encounter

## 2024-05-14 ENCOUNTER — Ambulatory Visit: Admitting: Hematology and Oncology

## 2025-02-04 ENCOUNTER — Ambulatory Visit: Admitting: Allergy and Immunology
